# Patient Record
Sex: Female | Born: 1995 | Race: White | Hispanic: No | Marital: Single | Smoking: Never smoker
Health system: Southern US, Community
[De-identification: ages and names within clinical notes are randomized; demographics above are authoritative.]

## PROBLEM LIST (undated history)

## (undated) DIAGNOSIS — F32A Depression, unspecified: Secondary | ICD-10-CM

## (undated) DIAGNOSIS — E669 Obesity, unspecified: Secondary | ICD-10-CM

## (undated) DIAGNOSIS — F419 Anxiety disorder, unspecified: Secondary | ICD-10-CM

## (undated) DIAGNOSIS — T7840XA Allergy, unspecified, initial encounter: Secondary | ICD-10-CM

## (undated) DIAGNOSIS — R600 Localized edema: Secondary | ICD-10-CM

## (undated) DIAGNOSIS — O139 Gestational [pregnancy-induced] hypertension without significant proteinuria, unspecified trimester: Secondary | ICD-10-CM

## (undated) HISTORY — DX: Allergy, unspecified, initial encounter: T78.40XA

## (undated) HISTORY — DX: Obesity, unspecified: E66.9

## (undated) HISTORY — DX: Gestational (pregnancy-induced) hypertension without significant proteinuria, unspecified trimester: O13.9

## (undated) HISTORY — DX: Localized edema: R60.0

## (undated) HISTORY — DX: Anxiety disorder, unspecified: F41.9

## (undated) HISTORY — DX: Depression, unspecified: F32.A

## (undated) HISTORY — PX: NO PAST SURGERIES: SHX2092

---
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
## 2014-10-11 DIAGNOSIS — J309 Allergic rhinitis, unspecified: Secondary | ICD-10-CM | POA: Insufficient documentation

## 2014-10-11 DIAGNOSIS — J3089 Other allergic rhinitis: Secondary | ICD-10-CM | POA: Insufficient documentation

## 2014-10-12 ENCOUNTER — Ambulatory Visit: Payer: Self-pay | Admitting: Unknown Physician Specialty

## 2014-12-26 ENCOUNTER — Encounter: Payer: Self-pay | Admitting: Unknown Physician Specialty

## 2014-12-26 ENCOUNTER — Ambulatory Visit (INDEPENDENT_AMBULATORY_CARE_PROVIDER_SITE_OTHER): Payer: Commercial Managed Care - PPO | Admitting: Unknown Physician Specialty

## 2014-12-26 VITALS — BP 125/76 | HR 83 | Temp 98.7°F | Ht 72.4 in | Wt 291.4 lb

## 2014-12-26 DIAGNOSIS — J0100 Acute maxillary sinusitis, unspecified: Secondary | ICD-10-CM

## 2014-12-26 DIAGNOSIS — H66002 Acute suppurative otitis media without spontaneous rupture of ear drum, left ear: Secondary | ICD-10-CM | POA: Diagnosis not present

## 2014-12-26 MED ORDER — AMOXICILLIN 875 MG PO TABS: 875.0000 mg | ORAL_TABLET | Freq: Two times a day (BID) | ORAL | Status: DC

## 2014-12-26 NOTE — Progress Notes (Signed)
BP 125/76 mmHg  Pulse 83  Temp(Src) 98.7 F (37.1 C)  Ht 6' 0.4" (1.839 m)  Wt 291 lb 6.4 oz (132.178 kg)  BMI 39.08 kg/m2  SpO2 97%  LMP 12/16/2014 (Approximate)   Subjective:    Patient ID: Claire Walker, female    DOB: January 26, 1995, 19 y.o.   MRN: [id number]  HPI: Claire Walker is a 19 y.o. female  Chief Complaint  Patient presents with  . URI    pt states she has been having sinus draingage, nasal congestion, headache, ear ache (ringing in left ear), and sore throat. Patient states she cannot hear well, her face is puffy, and that these symptoms first started about a week ago.   URI  This is a new problem. The current episode started 1 to 4 weeks ago. The problem has been unchanged. There has been no fever. Associated symptoms include congestion, coughing, a plugged ear sensation, sinus pain and a sore throat. She has tried acetaminophen, NSAIDs and decongestant for the symptoms. The treatment provided no relief.    Relevant past medical, surgical, family and social history reviewed and updated as indicated. Interim medical history since our last visit reviewed. Allergies and medications reviewed and updated.  Review of Systems  HENT: Positive for congestion and sore throat.   Respiratory: Positive for cough.     Per HPI unless specifically indicated above     Objective:    BP 125/76 mmHg  Pulse 83  Temp(Src) 98.7 F (37.1 C)  Ht 6' 0.4" (1.839 m)  Wt 291 lb 6.4 oz (132.178 kg)  BMI 39.08 kg/m2  SpO2 97%  LMP 12/16/2014 (Approximate)  Wt Readings from Last 3 Encounters:  12/26/14 291 lb 6.4 oz (132.178 kg)  10/11/14 253 lb (114.76 kg)    Physical Exam  Constitutional: She is oriented to person, place, and time. She appears well-developed and well-nourished. No distress.  HENT:  Head: Normocephalic and atraumatic.  Right Ear: Ear canal normal. Tympanic membrane is erythematous and bulging. A middle ear effusion is present. Decreased hearing is noted.  Left Ear:  Tympanic membrane and ear canal normal.  Nose: No rhinorrhea. Right sinus exhibits maxillary sinus tenderness. Right sinus exhibits no frontal sinus tenderness. Left sinus exhibits maxillary sinus tenderness. Left sinus exhibits no frontal sinus tenderness.  Eyes: Conjunctivae and lids are normal. Right eye exhibits no discharge. Left eye exhibits no discharge. No scleral icterus.  Cardiovascular: Normal rate and regular rhythm.   Pulmonary/Chest: Effort normal and breath sounds normal. No respiratory distress.  Abdominal: Normal appearance. There is no splenomegaly or hepatomegaly.  Musculoskeletal: Normal range of motion.  Neurological: She is alert and oriented to person, place, and time.  Skin: Skin is intact. No rash noted. No pallor.  Psychiatric: She has a normal mood and affect. Her behavior is normal. Judgment and thought content normal.    No results found for this or any previous visit.    Assessment & Plan:   Problem List Items Addressed This Visit    None    Visit Diagnoses    Acute maxillary sinusitis, recurrence not specified    -  Primary    Relevant Medications    amoxicillin (AMOXIL) 875 MG tablet    Acute suppurative otitis media of left ear without spontaneous rupture of tympanic membrane, recurrence not specified        Rx for Amoxil 875 mg BID    Relevant Medications    amoxicillin (AMOXIL) 875 MG tablet  Follow up plan: Return if symptoms worsen or fail to improve.

## 2015-01-07 ENCOUNTER — Telehealth: Payer: Self-pay | Admitting: Unknown Physician Specialty

## 2015-01-07 MED ORDER — FLUCONAZOLE 150 MG PO TABS: 150.0000 mg | ORAL_TABLET | Freq: Once | ORAL | Status: DC

## 2015-01-07 NOTE — Telephone Encounter (Signed)
Routing to provider. Patient was seen 12/26/14.

## 2015-01-07 NOTE — Telephone Encounter (Signed)
Pt would like to have something called in to [facility] for a yeast infection.

## 2015-01-08 ENCOUNTER — Telehealth: Payer: Self-pay | Admitting: Unknown Physician Specialty

## 2015-01-08 MED ORDER — AZITHROMYCIN 250 MG PO TABS: ORAL_TABLET | ORAL | Status: DC

## 2015-01-08 NOTE — Telephone Encounter (Signed)
Called and left patient a voicemail letting her know that another rx was sent to her pharmacy.

## 2015-01-08 NOTE — Telephone Encounter (Signed)
Routing to provider. Patient was seen 12/26/14. 

## 2015-01-08 NOTE — Telephone Encounter (Signed)
pts mom called and stated that her daughter wasn't getting any better and would like something else to be sent to [facility]

## 2015-02-07 ENCOUNTER — Ambulatory Visit (INDEPENDENT_AMBULATORY_CARE_PROVIDER_SITE_OTHER): Payer: Commercial Managed Care - PPO | Admitting: Family Medicine

## 2015-02-07 ENCOUNTER — Encounter: Payer: Self-pay | Admitting: Family Medicine

## 2015-02-07 VITALS — BP 130/80 | HR 83 | Temp 98.3°F | Ht 71.2 in | Wt 292.0 lb

## 2015-02-07 DIAGNOSIS — Z30011 Encounter for initial prescription of contraceptive pills: Secondary | ICD-10-CM

## 2015-02-07 MED ORDER — NORETHIN ACE-ETH ESTRAD-FE 1-20 MG-MCG PO TABS
1.0000 | ORAL_TABLET | Freq: Every day | ORAL | Status: DC
Start: 2015-02-07 — End: 2016-02-12

## 2015-02-07 NOTE — Patient Instructions (Signed)
Oral Contraception Use Oral contraceptive pills (OCPs) are medicines taken to prevent pregnancy. OCPs work by preventing the ovaries from releasing eggs. The hormones in OCPs also cause the cervical mucus to thicken, preventing the sperm from entering the uterus. The hormones also cause the uterine lining to become thin, not allowing a fertilized egg to attach to the inside of the uterus. OCPs are highly effective when taken exactly as prescribed. However, OCPs do not prevent sexually transmitted diseases (STDs). Safe sex practices, such as using condoms along with an OCP, can help prevent STDs. Before taking OCPs, you may have a physical exam and Pap test. Your health care provider may also order blood tests if necessary. Your health care provider will make sure you are a good candidate for oral contraception. Discuss with your health care provider the possible side effects of the OCP you may be prescribed. When starting an OCP, it can take 2 to 3 months for the body to adjust to the changes in hormone levels in your body.  HOW TO TAKE ORAL CONTRACEPTIVE PILLS Your health care provider may advise you on how to start taking the first cycle of OCPs. Otherwise, you can:   Start on day 1 of your menstrual period. You will not need any backup contraceptive protection with this start time.   Start on the first Sunday after your menstrual period or the day you get your prescription. In these cases, you will need to use backup contraceptive protection for the first week.   Start the pill at any time of your cycle. If you take the pill within 5 days of the start of your period, you are protected against pregnancy right away. In this case, you will not need a backup form of birth control. If you start at any other time of your menstrual cycle, you will need to use another form of birth control for 7 days. If your OCP is the type called a minipill, it will protect you from pregnancy after taking it for 2 days (48  hours). After you have started taking OCPs:   If you forget to take 1 pill, take it as soon as you remember. Take the next pill at the regular time.   If you miss 2 or more pills, call your health care provider because different pills have different instructions for missed doses. Use backup birth control until your next menstrual period starts.   If you use a 28-day pack that contains inactive pills and you miss 1 of the last 7 pills (pills with no hormones), it will not matter. Throw away the rest of the non-hormone pills and start a new pill pack.  No matter which day you start the OCP, you will always start a new pack on that same day of the week. Have an extra pack of OCPs and a backup contraceptive method available in case you miss some pills or lose your OCP pack.  HOME CARE INSTRUCTIONS   Do not smoke.   Always use a condom to protect against STDs. OCPs do not protect against STDs.   Use a calendar to mark your menstrual period days.   Read the information and directions that came with your OCP. Talk to your health care provider if you have questions.  SEEK MEDICAL CARE IF:   You develop nausea and vomiting.   You have abnormal vaginal discharge or bleeding.   You develop a rash.   You miss your menstrual period.   You are losing   your hair.   You need treatment for mood swings or depression.   You get dizzy when taking the OCP.   You develop acne from taking the OCP.   You become pregnant.  SEEK IMMEDIATE MEDICAL CARE IF:   You develop chest pain.   You develop shortness of breath.   You have an uncontrolled or severe headache.   You develop numbness or slurred speech.   You develop visual problems.   You develop pain, redness, and swelling in the legs.    This information is not intended to replace advice given to you by your health care provider. Make sure you discuss any questions you have with your health care provider.   Document  Released: 12/25/2010 Document Revised: 01/26/2014 Document Reviewed: 06/26/2012 Elsevier Interactive Patient Education 2016 [employer]  

## 2015-02-07 NOTE — Progress Notes (Signed)
BP 130/80 mmHg  Pulse 83  Temp(Src) 98.3 F (36.8 C)  Ht 5' 11.2" (1.808 m)  Wt 292 lb (132.45 kg)  BMI 40.52 kg/m2  SpO2 98%  LMP 02/04/2015 (Exact Date)   Subjective:    Patient ID: Claire Walker, female    DOB: October 19, 1995, 20 y.o.   MRN: [id number]  HPI: Claire Walker is a 20 y.o. female  Chief Complaint  Patient presents with  . Contraception    Patient would like to discuss starting a birth control pill to help her periods, they are very heavy. She states that she would like one that is not going to cause her to gain weight   CONTRACEPTION CONCERNS Contraception: Occasional condom Previous contraception: OCP, doesn't remember what it was Sexual activity: Sexually active, no concerns,  Gravida/Para: G0P0 Menarche at age: 61 Average interval between menses: about a month  Length of menses: 5-7 days Flow: very heavy- has been going on for a long time since about 2 years after started period, but just dealt with it. Usually has to change every 2-3 hours Dysmenorrhea: bad cramps  Relevant past medical, surgical, family and social history reviewed and updated as indicated. Interim medical history since our last visit reviewed. Allergies and medications reviewed and updated.  Review of Systems  Constitutional: Negative.   Cardiovascular: Negative.   Gastrointestinal: Negative.   Genitourinary: Negative.   Psychiatric/Behavioral: Negative.    Per HPI unless specifically indicated above     Objective:    BP 130/80 mmHg  Pulse 83  Temp(Src) 98.3 F (36.8 C)  Ht 5' 11.2" (1.808 m)  Wt 292 lb (132.45 kg)  BMI 40.52 kg/m2  SpO2 98%  LMP 02/04/2015 (Exact Date)  Wt Readings from Last 3 Encounters:  02/07/15 292 lb (132.45 kg)  12/26/14 291 lb 6.4 oz (132.178 kg)  10/11/14 253 lb (114.76 kg)    Physical Exam  Constitutional: She is oriented to person, place, and time. She appears well-developed and well-nourished. No distress.  HENT:  Head: Normocephalic and  atraumatic.  Right Ear: Hearing normal.  Left Ear: Hearing normal.  Nose: Nose normal.  Eyes: Conjunctivae and lids are normal. Right eye exhibits no discharge. Left eye exhibits no discharge. No scleral icterus.  Cardiovascular: Normal rate, regular rhythm, normal heart sounds and intact distal pulses.  Exam reveals no gallop and no friction rub.   No murmur heard. Pulmonary/Chest: Effort normal and breath sounds normal. No respiratory distress. She has no wheezes. She has no rales. She exhibits no tenderness.  Musculoskeletal: Normal range of motion.  Neurological: She is alert and oriented to person, place, and time.  Skin: Skin is warm, dry and intact. No rash noted. No erythema. No pallor.  Psychiatric: She has a normal mood and affect. Her speech is normal and behavior is normal. Judgment and thought content normal. Cognition and memory are normal.  Nursing note and vitals reviewed.  No results found for this or any previous visit.    Assessment & Plan:   Problem List Items Addressed This Visit    None    Visit Diagnoses    Encounter for initial prescription of contraceptive pills    -  Primary    Risks and benefits discussed. We will start OCP. She will return in 1 month to make sure BP is doing OK and to check to see how she is doing.         Follow up plan: Return in about 4 weeks (around 03/07/2015) for  follow up OCP.

## 2015-02-08 ENCOUNTER — Ambulatory Visit: Payer: Commercial Managed Care - PPO | Admitting: Unknown Physician Specialty

## 2015-03-08 ENCOUNTER — Encounter: Payer: Self-pay | Admitting: Unknown Physician Specialty

## 2015-03-08 ENCOUNTER — Ambulatory Visit: Payer: Commercial Managed Care - PPO | Admitting: Unknown Physician Specialty

## 2015-03-08 VITALS — BP 125/81 | HR 69 | Temp 98.3°F | Ht 71.3 in | Wt 281.0 lb

## 2015-03-08 DIAGNOSIS — Z3009 Encounter for other general counseling and advice on contraception: Secondary | ICD-10-CM

## 2015-03-08 NOTE — Progress Notes (Signed)
   BP 125/81 mmHg  Pulse 69  Temp(Src) 98.3 F (36.8 C)  Ht 5' 11.3" (1.811 m)  Wt 281 lb (127.461 kg)  BMI 38.86 kg/m2  SpO2 98%  LMP 02/04/2015 (Exact Date)   Subjective:    Patient ID: Claire Walker, female    DOB: [birth year], 20 y.o.   MRN: [id number]  HPI: Claire Walker is a 20 y.o. female  Chief Complaint  Patient presents with  . Contraception    pt states she is here for 1 month f/u after starting BC   Pt is here for a BP check to f/u her BCPs Relevant past medical, surgical, family and social history reviewed and updated as indicated. Interim medical history since our last visit reviewed. Allergies and medications reviewed and updated.  Review of Systems  Per HPI unless specifically indicated above     Objective:    BP 125/81 mmHg  Pulse 69  Temp(Src) 98.3 F (36.8 C)  Ht 5' 11.3" (1.811 m)  Wt 281 lb (127.461 kg)  BMI 38.86 kg/m2  SpO2 98%  LMP 02/04/2015 (Exact Date)  Wt Readings from Last 3 Encounters:  03/08/15 281 lb (127.461 kg)  02/07/15 292 lb (132.45 kg)  12/26/14 291 lb 6.4 oz (132.178 kg)    Physical Exam  Constitutional: She is oriented to person, place, and time. She appears well-developed and well-nourished. No distress.  HENT:  Head: Normocephalic and atraumatic.  Eyes: Conjunctivae and lids are normal. Right eye exhibits no discharge. Left eye exhibits no discharge. No scleral icterus.  Cardiovascular: Normal rate.   Pulmonary/Chest: Effort normal.  Abdominal: Normal appearance. There is no splenomegaly or hepatomegaly.  Musculoskeletal: Normal range of motion.  Neurological: She is alert and oriented to person, place, and time.  Skin: Skin is intact. No rash noted. No pallor.  Psychiatric: She has a normal mood and affect. Her behavior is normal. Judgment and thought content normal.    No results found for this or any previous visit.    Assessment & Plan:   Problem List Items Addressed This Visit    None    Visit Diagnoses    Family planning    -  Primary    Normal BP.  Continue present BCPs        Follow up plan: Return for physical.

## 2015-03-21 ENCOUNTER — Telehealth: Payer: Self-pay | Admitting: Unknown Physician Specialty

## 2015-03-21 NOTE — Telephone Encounter (Signed)
Pt would like to speak with Elnita Maxwell about side effects from her new birth control. Generally works until 4pm if she could get a call back after work.

## 2015-03-22 NOTE — Telephone Encounter (Signed)
Routing to [name]

## 2015-03-22 NOTE — Telephone Encounter (Signed)
Please let her know that I won't be in this afternoon.  Also, let her know that many bcps cause irregular bleeding for about 3 months.  If she could let you know the side-effects, it would be helpful

## 2015-03-22 NOTE — Telephone Encounter (Signed)
Called and apologized to patient as Claire Walker and I were both out of the office yesterday afternoon and will be this afternoon as well. I gave her the number to my direct line and asked her to please leave me a voicemail letting us know what kind of side effects she is having.

## 2015-03-25 NOTE — Telephone Encounter (Signed)
Called and left patient a voicemail letting her know what Elnita MaxwellCheryl said. I asked for her to please give us a call back if she has any other questions or concerns.

## 2015-03-25 NOTE — Telephone Encounter (Signed)
Patient returned call. She stated that her period came early because she states she is just now getting ready to start her 4th week of pills. She also states that when her period started last week that it is still going on but the whole time it has been a brown color and very very light bleeding. She just wants to make sure that is okay.

## 2015-03-25 NOTE — Telephone Encounter (Signed)
Called and let patient know that Elnita MaxwellCheryl and I would be in the office all day today until 5 pm. I left a voicemail asking for the patient to please return my call when she could.

## 2015-03-25 NOTE — Telephone Encounter (Signed)
It is OK.  If often takes about 3 months for your period to regulate

## 2015-06-05 ENCOUNTER — Encounter: Payer: Commercial Managed Care - PPO | Admitting: Unknown Physician Specialty

## 2015-09-26 ENCOUNTER — Ambulatory Visit
Admission: RE | Admit: 2015-09-26 | Discharge: 2015-09-26 | Disposition: A | Discharge status: Home / Self Care | Payer: Commercial Managed Care - PPO | Source: Ambulatory Visit | Attending: Family Medicine | Admitting: Family Medicine

## 2015-09-26 ENCOUNTER — Ambulatory Visit (INDEPENDENT_AMBULATORY_CARE_PROVIDER_SITE_OTHER): Payer: Commercial Managed Care - PPO | Admitting: Family Medicine

## 2015-09-26 ENCOUNTER — Encounter: Payer: Self-pay | Admitting: Family Medicine

## 2015-09-26 ENCOUNTER — Telehealth: Payer: Self-pay | Admitting: Family Medicine

## 2015-09-26 VITALS — BP 122/75 | HR 72 | Temp 98.6°F | Wt 282.0 lb

## 2015-09-26 DIAGNOSIS — S99911A Unspecified injury of right ankle, initial encounter: Secondary | ICD-10-CM

## 2015-09-26 DIAGNOSIS — M7989 Other specified soft tissue disorders: Secondary | ICD-10-CM | POA: Insufficient documentation

## 2015-09-26 DIAGNOSIS — M25571 Pain in right ankle and joints of right foot: Secondary | ICD-10-CM | POA: Diagnosis present

## 2015-09-26 NOTE — Telephone Encounter (Signed)
Patient notified

## 2015-09-26 NOTE — Patient Instructions (Signed)
Follow up as needed

## 2015-09-26 NOTE — Telephone Encounter (Signed)
Please call patient and let her know her x-ray was negative for fracture, just a bad sprain. Continue as planned.

## 2015-09-26 NOTE — Progress Notes (Signed)
   BP 122/75   Pulse 72   Temp 98.6 F (37 C)   Wt 282 lb (127.9 kg)   LMP 09/08/2015   SpO2 98%   BMI 39.00 kg/m    Subjective:    Patient ID: Claire Walker, female    DOB: 1995/02/17, 20 y.o.   MRN: [id number]  HPI: Claire Walker is a 20 y.o. female  Chief Complaint  Patient presents with  . Ankle Pain    x 1 week, right ankle, missed a step going down the stairs. Swelling and bruising still.   Patient presents with 1 week history of right ankle pain following missing a step down her stairs. Persistent swelling, bruising, and moderate to severe pain with weight bearing. States the muscles in her right calf are sore as well and feel like they're pulling when she bends her ankle. Works on her feet all day and is having a lot of trouble continuing to work. Taking motrin and tylenol with some relief. Has been using RICE methods.   Relevant past medical, surgical, family and social history reviewed and updated as indicated. Interim medical history since our last visit reviewed. Allergies and medications reviewed and updated.  Review of Systems  Constitutional: Negative.   HENT: Negative.   Respiratory: Negative.   Cardiovascular: Negative.   Gastrointestinal: Negative.   Genitourinary: Negative.   Musculoskeletal: Positive for arthralgias, gait problem, joint swelling and myalgias.  Skin: Negative.   Psychiatric/Behavioral: Negative.     Per HPI unless specifically indicated above     Objective:    BP 122/75   Pulse 72   Temp 98.6 F (37 C)   Wt 282 lb (127.9 kg)   LMP 09/08/2015   SpO2 98%   BMI 39.00 kg/m   Wt Readings from Last 3 Encounters:  09/26/15 282 lb (127.9 kg)  03/08/15 281 lb (127.5 kg)  02/07/15 292 lb (132.5 kg)    Physical Exam  Constitutional: She is oriented to person, place, and time. She appears well-developed and well-nourished.  HENT:  Head: Atraumatic.  Eyes: Conjunctivae are normal. No scleral icterus.  Neck: Normal range of motion. Neck  supple.  Cardiovascular: Normal rate and normal heart sounds.   Pulmonary/Chest: Effort normal. No respiratory distress.  Musculoskeletal: She exhibits edema and tenderness (TTP over right lateral malleolus).  Passive ROM mostly intact in R ankle, limited by swelling and pain  Neurological: She is alert and oriented to person, place, and time.  Skin: Skin is warm and dry.  Some bruising over right lateral malleolus   Psychiatric: She has a normal mood and affect. Her behavior is normal.    No results found for this or any previous visit.    Assessment & Plan:   Problem List Items Addressed This Visit    None    Visit Diagnoses    Ankle injury, right, initial encounter    -  Primary   Low suspicion for fracture, likely just a bad sprain, but will get ankle imaging to r/o. Continue motrin and tylenol, RICE.    Relevant Orders   DG Ankle Complete Right       Follow up plan: Return if symptoms worsen or fail to improve.

## 2015-09-26 NOTE — Telephone Encounter (Signed)
Left message to call.

## 2015-09-27 ENCOUNTER — Ambulatory Visit: Payer: Commercial Managed Care - PPO | Admitting: Family Medicine

## 2015-10-04 ENCOUNTER — Encounter: Payer: Self-pay | Admitting: Unknown Physician Specialty

## 2015-10-04 ENCOUNTER — Ambulatory Visit (INDEPENDENT_AMBULATORY_CARE_PROVIDER_SITE_OTHER): Payer: Commercial Managed Care - PPO | Admitting: Unknown Physician Specialty

## 2015-10-04 VITALS — BP 119/79 | HR 66 | Temp 98.4°F | Ht 73.1 in | Wt 283.0 lb

## 2015-10-04 DIAGNOSIS — L298 Other pruritus: Secondary | ICD-10-CM | POA: Diagnosis not present

## 2015-10-04 DIAGNOSIS — Z3041 Encounter for surveillance of contraceptive pills: Secondary | ICD-10-CM

## 2015-10-04 DIAGNOSIS — N898 Other specified noninflammatory disorders of vagina: Secondary | ICD-10-CM

## 2015-10-04 LAB — WET PREP FOR TRICH, YEAST, CLUE
Clue Cell Exam: NEGATIVE
Trichomonas Exam: NEGATIVE
Yeast Exam: NEGATIVE

## 2015-10-04 MED ORDER — FLUCONAZOLE 150 MG PO TABS
150.0000 mg | ORAL_TABLET | Freq: Once | ORAL | 0 refills | Status: AC
Start: 2015-10-04 — End: 2015-10-04

## 2015-10-04 MED ORDER — DROSPIRENONE-ETHINYL ESTRADIOL 3-0.03 MG PO TABS: 1.0000 | ORAL_TABLET | Freq: Every day | ORAL | 3 refills | Status: DC

## 2015-10-04 NOTE — Progress Notes (Signed)
   BP 119/79 (BP Location: Left Arm, Patient Position: Sitting, Cuff Size: Large)   Pulse 66   Temp 98.4 F (36.9 C)   Ht 6' 1.1" (1.857 m)   Wt 283 lb (128.4 kg)   LMP 09/08/2015 (Approximate)   SpO2 98%   BMI 37.24 kg/m    Subjective:    Patient ID: Claire Walker, female    DOB: 05/30/1995, 20 y.o.   MRN: [id number]  HPI: Claire Walker is a 20 y.o. female  Chief Complaint  Patient presents with  . Contraception    pt states she wants to talk with provider about changing BC  . Vaginal Itching    pt states she has been having some vaginal itching for about a week now    BCPs Pt is wondering if a new BCP would help her feelings of depression.  She states it is not severe but wants to try something different besides the pill.    Vaginal itching States it has been there for over a week.  States she is having some discharge but not "a lot."  She has not noticed a monthly issue.    Relevant past medical, surgical, family and social history reviewed and updated as indicated. Interim medical history since our last visit reviewed. Allergies and medications reviewed and updated.  Review of Systems  Per HPI unless specifically indicated above     Objective:    BP 119/79 (BP Location: Left Arm, Patient Position: Sitting, Cuff Size: Large)   Pulse 66   Temp 98.4 F (36.9 C)   Ht 6' 1.1" (1.857 m)   Wt 283 lb (128.4 kg)   LMP 09/08/2015 (Approximate)   SpO2 98%   BMI 37.24 kg/m   Wt Readings from Last 3 Encounters:  10/04/15 283 lb (128.4 kg)  09/26/15 282 lb (127.9 kg)  03/08/15 281 lb (127.5 kg)    Physical Exam  Genitourinary: Uterus normal. No labial fusion. There is no rash, tenderness, lesion or injury on the right labia. There is no rash, tenderness, lesion or injury on the left labia. Cervix exhibits no motion tenderness, no discharge and no friability. Right adnexum displays no mass. Left adnexum displays no mass. Vaginal discharge found.    No results found for  this or any previous visit.    Assessment & Plan:   Problem List Items Addressed This Visit    None    Visit Diagnoses    Vaginal itching    -  Primary   Rx for Diflucan.  wet prep negative   Relevant Orders   WET PREP FOR TRICH, YEAST, CLUE   Encounter for surveillance of contraceptive pills       Change to Yasmin.  May need to switch to continuous use   Relevant Orders   Pap Lb, rfx HPV ASCU       Follow up plan: Return for physical.

## 2015-10-07 ENCOUNTER — Encounter: Payer: Self-pay | Admitting: Unknown Physician Specialty

## 2015-10-07 LAB — PAP LB, RFX HPV ASCU: PAP Smear Comment: 0

## 2015-10-07 NOTE — Progress Notes (Signed)
Normal labs.  Patient notified by letter.

## 2016-02-12 ENCOUNTER — Other Ambulatory Visit: Payer: Self-pay | Admitting: Family Medicine

## 2016-02-12 NOTE — Telephone Encounter (Signed)
Your patient 

## 2016-10-08 ENCOUNTER — Telehealth: Payer: Self-pay | Admitting: Unknown Physician Specialty

## 2016-10-08 NOTE — Telephone Encounter (Signed)
Patient would like to know if provider or CMA recommeds nutritionist. Patient would like to know if there are any good nutritionist that are usually recommended to patients.  Please Advise.  Thank you

## 2016-10-08 NOTE — Telephone Encounter (Signed)
Routing to provider for advice.

## 2016-10-09 NOTE — Telephone Encounter (Signed)
I would look at her insurance as to what services is covered and who is in network.  [employer] is a great program is goal is to lose weight

## 2016-10-09 NOTE — Telephone Encounter (Signed)
Called and left patient a VM asking for her to please return my call.  

## 2016-10-12 NOTE — Telephone Encounter (Signed)
Called and let patient know what [name] said.  

## 2017-01-06 ENCOUNTER — Telehealth: Payer: Self-pay | Admitting: Unknown Physician Specialty

## 2017-01-06 NOTE — Telephone Encounter (Signed)
Routing to provider, FYI.  

## 2017-01-06 NOTE — Telephone Encounter (Signed)
Patient's mother is calling to report her daughter has been sick with vomiting and diarrhea for 3-4 days. She took Pepto and she did improve some- but she is avoiding eating. Mother reports no fever or  headache. Patient is currently at work and unable to speak. Encouraged mother to have patient drink to hydrate and replace her electrolytes. Also advise mother to have patient call for an appointment if she does not improve within the next 24-48 hours.  (Unable to triage patient- mother calling) Mother called back- daughter does have neck pain- offered appointment- she is not going to miss work- mother is going to take her to Urgent Care

## 2017-01-06 NOTE — Telephone Encounter (Signed)
Thanks, but it looks like she is talking about her daughter.  Is she a pt here?

## 2017-01-07 ENCOUNTER — Ambulatory Visit: Payer: Commercial Managed Care - PPO | Admitting: Family Medicine

## 2017-02-11 ENCOUNTER — Other Ambulatory Visit: Payer: Self-pay | Admitting: Unknown Physician Specialty

## 2017-04-07 ENCOUNTER — Encounter: Payer: Self-pay | Admitting: Unknown Physician Specialty

## 2017-04-07 ENCOUNTER — Ambulatory Visit (INDEPENDENT_AMBULATORY_CARE_PROVIDER_SITE_OTHER): Payer: Self-pay | Admitting: Unknown Physician Specialty

## 2017-04-07 DIAGNOSIS — F322 Major depressive disorder, single episode, severe without psychotic features: Secondary | ICD-10-CM

## 2017-04-07 MED ORDER — CITALOPRAM HYDROBROMIDE 20 MG PO TABS
20.0000 mg | ORAL_TABLET | Freq: Every day | ORAL | 3 refills | Status: DC
Start: 2017-04-07 — End: 2017-06-07

## 2017-04-07 NOTE — Progress Notes (Signed)
BP 121/77   Pulse 67   Temp 98.5 F (36.9 C) (Oral)   Ht 5' 11.7" (1.821 m)   Wt 283 lb 9.6 oz (128.6 kg)   LMP 03/30/2017 (Approximate)   SpO2 99%   BMI 38.79 kg/m    Subjective:    Patient ID: Claire Walker, female    DOB: [birth year], 22 y.o.   MRN: [id number]  HPI: Claire Walker is a 22 y.o. female  Chief Complaint  Patient presents with  . Anxiety    pt states she has been bothered by her anxiety for a while now   Pt having a lot of anxiety for a long time.  States this has been going on for quite some time and thinks she has been depressed her whole life.  States she is tired.  States her mom has had mental breakdowns her whole life.  She feels she is now an age in which she can get help.  No suicidal ideation.  Working 2 jobs and going to school full time.  She is unable to stop her brain.  She is not happy with the way she is and frustrated with obesity.  MDQ with 5 yes answers     [facility]2017  Decreased Interest 2  Down, Depressed, Hopeless 2  PHQ - 2 Score 4  Altered sleeping 2  Tired, decreased energy 2  Change in appetite 2  Feeling bad or failure about yourself  2  Trouble concentrating 3  Moving slowly or fidgety/restless 2  Suicidal thoughts 0  PHQ-9 Score 17   Obesity Pt is very concerned about this.  She is very athletic and has a healthy diet.  Overweight all her live   Relevant past medical, surgical, family and social history reviewed and updated as indicated. Interim medical history since our last visit reviewed. Allergies and medications reviewed and updated.  Review of Systems  Constitutional: Negative.   Respiratory: Negative.   Cardiovascular: Negative.   Gastrointestinal: Negative.   Genitourinary: Negative.   Musculoskeletal: Negative.   Neurological: Negative.     Per HPI unless specifically indicated above     Objective:    BP 121/77   Pulse 67   Temp 98.5 F (36.9 C) (Oral)   Ht 5' 11.7" (1.821 m)   Wt  283 lb 9.6 oz (128.6 kg)   LMP 03/30/2017 (Approximate)   SpO2 99%   BMI 38.79 kg/m   Wt Readings from Last 3 Encounters:  04/07/17 283 lb 9.6 oz (128.6 kg)  10/04/15 283 lb (128.4 kg)  09/26/15 282 lb (127.9 kg)    Physical Exam  Constitutional: She is oriented to person, place, and time. She appears well-developed and well-nourished. No distress.  HENT:  Head: Normocephalic and atraumatic.  Eyes: Conjunctivae and lids are normal. Right eye exhibits no discharge. Left eye exhibits no discharge. No scleral icterus.  Neck: Normal range of motion. Neck supple. No JVD present. Carotid bruit is not present.  Cardiovascular: Normal rate, regular rhythm and normal heart sounds.  Pulmonary/Chest: Effort normal and breath sounds normal.  Abdominal: Normal appearance. There is no splenomegaly or hepatomegaly.  Musculoskeletal: Normal range of motion.  Neurological: She is alert and oriented to person, place, and time.  Skin: Skin is warm, dry and intact. No rash noted. No pallor.  Psychiatric: She has a normal mood and affect. Her behavior is normal. Judgment and thought content normal.      Assessment & Plan:   Problem  List Items Addressed This Visit      Unprioritized   Depression, major, single episode, severe (HCC)    Pt with significant new problem of depression.  Can't r/o bipolar. Will start with Citalopram with warnings about mania. Discussed side-effects.  Stop medication immediately if signs of mania or suicidal ideation.  Recheck in 2 weeks      Relevant Medications   citalopram (CELEXA) 20 MG tablet       Follow up plan: Return in about 2 weeks (around 04/21/2017).

## 2017-04-07 NOTE — Assessment & Plan Note (Addendum)
Pt with significant new problem of depression.  Can't r/o bipolar. Will start with Citalopram with warnings about mania. Discussed side-effects.  Stop medication immediately if signs of mania or suicidal ideation.  Recheck in 2 weeks.  Check out employee assistance counseling

## 2017-04-27 ENCOUNTER — Encounter: Payer: Self-pay | Admitting: Unknown Physician Specialty

## 2017-04-27 ENCOUNTER — Ambulatory Visit (INDEPENDENT_AMBULATORY_CARE_PROVIDER_SITE_OTHER): Payer: Commercial Managed Care - PPO | Admitting: Unknown Physician Specialty

## 2017-04-27 DIAGNOSIS — F322 Major depressive disorder, single episode, severe without psychotic features: Secondary | ICD-10-CM

## 2017-04-27 DIAGNOSIS — E669 Obesity, unspecified: Secondary | ICD-10-CM | POA: Diagnosis not present

## 2017-04-27 NOTE — Assessment & Plan Note (Signed)
Much improvement on Citalopram 20 mg daily.

## 2017-04-27 NOTE — Patient Instructions (Addendum)
Belviq Contrave Saxenda  Wellbutrin - (another medication for depression.  Inexpensive)  Goodrx

## 2017-04-27 NOTE — Assessment & Plan Note (Signed)
She is very frustrated with her weight despite eating well and exercising.  I feel she is healthy.  Discussed medications weight loss options.

## 2017-04-27 NOTE — Progress Notes (Signed)
BP 127/80   Pulse 68   Temp 98.7 F (37.1 C) (Oral)   Ht 5' 11.7" (1.821 m)   Wt 283 lb 6.4 oz (128.5 kg)   LMP 03/30/2017 (Approximate)   SpO2 98%   BMI 38.76 kg/m    Subjective:    Patient ID: Claire Walker, female    DOB: [birth year], 22 y.o.   MRN: [id number]  HPI: Claire Walker is a 22 y.o. female  Chief Complaint  Patient presents with  . Depression    2 week f/up    Depression Pt is here to f/u Citlatopram given for depression and anxiety.  States she feels a lot better and feels "like a new person."  Things that used to bother her don't.  Sleeping at night OK.   [facility] 04/27/2017 04/07/2017  Decreased Interest 0 2  Down, Depressed, Hopeless 0 2  PHQ - 2 Score 0 4  Altered sleeping 0 2  Tired, decreased energy 0 2  Change in appetite 0 2  Feeling bad or failure about yourself  0 2  Trouble concentrating 0 3  Moving slowly or fidgety/restless 0 2  Suicidal thoughts 0 0  PHQ-9 Score 0 17     Relevant past medical, surgical, family and social history reviewed and updated as indicated. Interim medical history since our last visit reviewed. Allergies and medications reviewed and updated.  Review of Systems  Per HPI unless specifically indicated above     Objective:    BP 127/80   Pulse 68   Temp 98.7 F (37.1 C) (Oral)   Ht 5' 11.7" (1.821 m)   Wt 283 lb 6.4 oz (128.5 kg)   LMP 03/30/2017 (Approximate)   SpO2 98%   BMI 38.76 kg/m   Wt Readings from Last 3 Encounters:  04/27/17 283 lb 6.4 oz (128.5 kg)  04/07/17 283 lb 9.6 oz (128.6 kg)  10/04/15 283 lb (128.4 kg)    Physical Exam  Constitutional: She is oriented to person, place, and time. She appears well-developed and well-nourished. No distress.  HENT:  Head: Normocephalic and atraumatic.  Eyes: Conjunctivae and lids are normal. Right eye exhibits no discharge. Left eye exhibits no discharge. No scleral icterus.  Cardiovascular: Normal rate.  Pulmonary/Chest: Effort normal.    Abdominal: Normal appearance. There is no splenomegaly or hepatomegaly.  Musculoskeletal: Normal range of motion.  Neurological: She is alert and oriented to person, place, and time.  Skin: Skin is intact. No rash noted. No pallor.  Psychiatric: She has a normal mood and affect. Her behavior is normal. Judgment and thought content normal.    Results for orders placed or performed in visit on 10/04/15  WET PREP FOR TRICH, YEAST, CLUE  Result Value Ref Range   Trichomonas Exam Negative Negative   Yeast Exam Negative Negative   Clue Cell Exam Negative Negative  Pap Lb, rfx HPV ASCU  Result Value Ref Range   DIAGNOSIS: Comment    Specimen adequacy: Comment    Clinician Provided ICD10 Comment    Performed by: Comment    PAP Smear Comment .    Note: Comment    PAP Reflex Comment       Assessment & Plan:   Problem List Items Addressed This Visit      Unprioritized   Depression, major, single episode, severe (HCC)    Much improvement on Citalopram 20 mg daily.        Obesity (BMI 35.0-39.9 without comorbidity)  She is very frustrated with her weight despite eating well and exercising.  I feel she is healthy.  Discussed medications weight loss options.            Follow up plan: Return in about 1 month (around 05/25/2017).

## 2017-05-31 ENCOUNTER — Ambulatory Visit: Payer: Commercial Managed Care - PPO | Admitting: Unknown Physician Specialty

## 2017-06-07 ENCOUNTER — Telehealth: Payer: Self-pay | Admitting: Unknown Physician Specialty

## 2017-06-07 ENCOUNTER — Ambulatory Visit: Payer: Commercial Managed Care - PPO | Admitting: Unknown Physician Specialty

## 2017-06-07 MED ORDER — BUPROPION HCL ER (XL) 150 MG PO TB24: 150.0000 mg | ORAL_TABLET | Freq: Every day | ORAL | 1 refills | Status: DC

## 2017-06-07 NOTE — Telephone Encounter (Signed)
Tried calling patient. Patient did not answer and VM box was full so I could not leave a VM. OK for PEC to relay message to patient from [place] and schedule 6 week f/up. Will try to call patient again later if she does not call back.

## 2017-06-07 NOTE — Telephone Encounter (Signed)
Routing to provider  

## 2017-06-07 NOTE — Telephone Encounter (Signed)
Copied from CRM [id number]. Topic: General - Other >> Jun 07, 2017  8:14 AM Leafy Ro wrote: Reason for CRM:pt is calling the citalopram is making her hungry. Pt stated they discuss another medication that cost a little more. Pt does not remember the name of medication. Pt would like to try it. [facility] [place]. Pt would like a callback when new med is sent to pharm

## 2017-06-07 NOTE — Telephone Encounter (Signed)
Patient called and I told her who I was and that I was calling from [facility] office, I asked her to verify her DOB, patient says "I will have to call you back, bye." She hung the phone up.

## 2017-06-07 NOTE — Telephone Encounter (Signed)
I will change.  It is a different kind of medication and may work differently.  Please let her know that I would like to recheck her in about 6 weeks. Recommend gradual decrease of Citalopram to 1/2 pill for 1 week before discontinuing.  OK to take both meds on the same day and time.

## 2017-06-08 NOTE — Telephone Encounter (Signed)
Called and spoke to patient. I let her know about new medication and [name]'s instructions, patient verbalized understanding. Offered to schedule 6 week medication f/up for the patient but she stated that she would call back to schedule.

## 2017-06-30 ENCOUNTER — Telehealth: Payer: Self-pay

## 2017-06-30 NOTE — Telephone Encounter (Signed)
Copied from CRM [id number]. Topic: Inquiry >> Jun 28, 2017  8:26 AM Crist InfanteHarrald, Kathy J wrote: Reason for CRM: pt called to make a follow up, but [facility]  Pt asked if could send a message and let Elnita MaxwellCheryl know that she is feeling normal, able to work ok, all is good. Pt states the new med has also her hungry as the other, no change in appetite, Pt states you discussed her going on a weight loss med, contrave, belviq etc.  Anything that would help.  [id number] [place] [place] - [address] [phone] (Phone) [phone] (Fax)     >> Jun 30, 2017  9:56 AM Adela PortsWilliamson, Christan M wrote: Sorry about the delay, I just saw this message.    Routing to provider.

## 2017-06-30 NOTE — Telephone Encounter (Signed)
Please let her know that the wellbutrin she is on can be used as a weight loss medicine. She can check with her insurance about the other medicines that she and cheryl talked about- but it should not really be making her hungry.

## 2017-07-01 ENCOUNTER — Telehealth: Payer: Self-pay | Admitting: Unknown Physician Specialty

## 2017-07-01 NOTE — Telephone Encounter (Unsigned)
Copied from CRM [id number]. Topic: Inquiry >> Jun 28, 2017  8:26 AM Claire Walker, Claire Walker wrote: Reason for CRM: pt called to make a follow up, but [facility].  Pt asked if could send a message and let Claire MaxwellCheryl know that she is feeling normal, able to work ok, all is good. Pt states the new med has also her hungry as the other, no change in appetite, Pt states you discussed her going on a weight loss med, contrave, belviq etc.  Anything that would help.  [id number] [place] [place] - [address] [phone] (Phone) [phone] (Fax)     >> Jun 30, 2017  9:56 AM Adela PortsWilliamson, Claire Walker wrote: Sorry about the delay, I just saw this message.

## 2017-07-01 NOTE — Telephone Encounter (Signed)
Routing to provider  

## 2017-07-01 NOTE — Telephone Encounter (Signed)
Pt states she checked with her insurance, and they will not pay for any of the meds discussed.  Pt  is aware she will have to pay. Pt is requesting contrave, or another med. Pt states the welbutrin not helping. Pt states if she needs appt to discuss again, please let her know  [id number] [place], [place] - [address] [phone] (Phone) [phone] (Fax)

## 2017-07-01 NOTE — Telephone Encounter (Signed)
Patient will need to check with Elnita Maxwellheryl when she returns. In general weight loss medicines are not covered

## 2017-07-02 NOTE — Telephone Encounter (Signed)
Overdue for a follow up. Will need to come in for an appointment to discuss this and for follow up.

## 2017-07-02 NOTE — Telephone Encounter (Signed)
Patient states that she is aware that her insurance is not going to pay for these medications. Patient states she is willing to pay for it anyway, requesting contrave or another medication.

## 2017-07-05 NOTE — Telephone Encounter (Signed)
Please call and schedule patient a f/up visit to discuss medication per Dr. Laural BenesJohnson as Elnita MaxwellCheryl is out of the office. Thanks.

## 2017-07-07 NOTE — Telephone Encounter (Signed)
Patient called back and was scheduled an appointment via the PEC.

## 2017-07-07 NOTE — Telephone Encounter (Signed)
Tried to reach patient was unable to leave a message due to her VM full.  If patient calls back please schedule an appointment with a provider to discuss medication per Dr Laural BenesJohnson.   Thanks

## 2017-07-16 ENCOUNTER — Ambulatory Visit: Payer: Commercial Managed Care - PPO | Admitting: Physician Assistant

## 2017-07-31 ENCOUNTER — Other Ambulatory Visit: Payer: Self-pay | Admitting: Unknown Physician Specialty

## 2017-08-02 NOTE — Telephone Encounter (Signed)
Bupropion 150 MG tab refill Last Refill: 06/29/17 #30 tabs 0 refills. Last OV: 04/27/17 with remark patient to follow up in 1 month. She has been no show for last 3 appointments. TC to patient to schedule OV. Unable to leave VM at this time. Refilled for 30 days to bridge patient until an appointment can be made.   PCP: Jamesetta OrleansWicker, NP

## 2017-08-02 NOTE — Telephone Encounter (Signed)
Patient needs a follow-up from 04/27/17. She has cancelled last 3 appointments.

## 2017-08-24 ENCOUNTER — Other Ambulatory Visit: Payer: Self-pay | Admitting: Unknown Physician Specialty

## 2017-08-30 ENCOUNTER — Ambulatory Visit: Payer: Commercial Managed Care - PPO | Admitting: Family Medicine

## 2017-09-08 ENCOUNTER — Other Ambulatory Visit: Payer: Self-pay | Admitting: Unknown Physician Specialty

## 2017-09-10 ENCOUNTER — Ambulatory Visit: Payer: Commercial Managed Care - PPO | Admitting: Family Medicine

## 2017-10-07 ENCOUNTER — Other Ambulatory Visit: Payer: Self-pay | Admitting: Physician Assistant

## 2017-10-08 NOTE — Telephone Encounter (Signed)
Since you're here today.

## 2017-10-19 ENCOUNTER — Other Ambulatory Visit: Payer: Self-pay | Admitting: Unknown Physician Specialty

## 2018-01-19 DIAGNOSIS — E109 Type 1 diabetes mellitus without complications: Secondary | ICD-10-CM | POA: Diagnosis not present

## 2018-02-10 LAB — FETAL NONSTRESS TEST

## 2018-02-18 ENCOUNTER — Other Ambulatory Visit: Payer: Self-pay | Admitting: Unknown Physician Specialty

## 2018-02-19 DIAGNOSIS — E109 Type 1 diabetes mellitus without complications: Secondary | ICD-10-CM | POA: Diagnosis not present

## 2018-03-16 ENCOUNTER — Other Ambulatory Visit: Payer: Self-pay | Admitting: Unknown Physician Specialty

## 2018-03-16 MED ORDER — BUPROPION HCL ER (XL) 150 MG PO TB24: 150.0000 mg | ORAL_TABLET | Freq: Every day | ORAL | 0 refills | Status: DC

## 2018-03-16 NOTE — Telephone Encounter (Signed)
Please let patient know will refill x 30 days, but she needs follow-up for further refills as has not been seen since [place] 2019.  Thank you.

## 2018-03-16 NOTE — Telephone Encounter (Signed)
Copied from CRM [id number]. Topic: Quick Communication - Rx Refill/Question >> Mar 16, 2018  3:03 PM Wyonia Hough E wrote: Medication: buPROPion (WELLBUTRIN XL) 150 MG 24 hr tablet  Has the patient contacted their pharmacy? Yes - no refills    Preferred Pharmacy (with phone number or street name): [id number] [place], [place] - [address] [phone] (Phone) [phone] (Fax)    Agent: Please be advised that RX refills may take up to 3 business days. We ask that you follow-up with your pharmacy.

## 2018-03-16 NOTE — Telephone Encounter (Signed)
Requested medication (s) are due for refill today: yes  Requested medication (s) are on the active medication list: yes  Last refill:  10/08/17 #30 no RF  Future visit scheduled: No  Notes to clinic:  Called pt; unable to leave message voicemail not set up; >3 months overdue routing to office   Requested Prescriptions  Pending Prescriptions Disp Refills   buPROPion (WELLBUTRIN XL) 150 MG 24 hr tablet 30 tablet 0    Sig: Take 1 tablet (150 mg total) by mouth daily.     Psychiatry: Antidepressants - bupropion Failed - 03/16/2018  3:06 PM      Failed - Valid encounter within last 6 months    Recent Outpatient Visits          10 months ago Depression, major, single episode, severe (HCC)   [facility] Gabriel Cirri, NP   11 months ago Depression, major, single episode, severe (HCC)   [facility] Gabriel Cirri, NP   2 years ago Vaginal itching   [facility] Gabriel Cirri, NP   2 years ago Ankle injury, right, initial encounter   [facility] Particia N[place], [place]   3 years ago Family planning   [facility] Gabriel Cirri, NP             Passed - Completed PHQ-2 or PHQ-9 in the last 360 days.      Passed - Last BP in normal range    BP Readings from Last 1 Encounters:  04/27/17 127/80

## 2018-03-16 NOTE — Telephone Encounter (Signed)
Refill x 30 days, for further she needs follow-up.

## 2018-03-17 NOTE — Telephone Encounter (Signed)
Left message on machine for pt to return call to the office. Attempted second number listed and was told that was no longer her number. Will remove from contact list.

## 2018-03-17 NOTE — Telephone Encounter (Signed)
Tried to call patient, no answer, unable to leave a message. 

## 2018-03-18 NOTE — Telephone Encounter (Signed)
Attempted to reach pt. VM box has not been set up.

## 2018-03-21 NOTE — Telephone Encounter (Signed)
Due to inability to reach patient letter was printed and mailed.

## 2018-03-21 NOTE — Telephone Encounter (Signed)
Attempted to reach. VM box has not been set up.

## 2018-06-15 ENCOUNTER — Other Ambulatory Visit: Payer: Self-pay

## 2018-06-15 ENCOUNTER — Encounter: Payer: Self-pay | Admitting: Family Medicine

## 2018-06-15 ENCOUNTER — Ambulatory Visit (INDEPENDENT_AMBULATORY_CARE_PROVIDER_SITE_OTHER): Payer: Commercial Managed Care - PPO | Admitting: Family Medicine

## 2018-06-15 DIAGNOSIS — F322 Major depressive disorder, single episode, severe without psychotic features: Secondary | ICD-10-CM | POA: Diagnosis not present

## 2018-06-15 DIAGNOSIS — E669 Obesity, unspecified: Secondary | ICD-10-CM

## 2018-06-15 MED ORDER — BUPROPION HCL ER (XL) 300 MG PO TB24: 300.0000 mg | ORAL_TABLET | Freq: Every day | ORAL | 0 refills | Status: DC

## 2018-06-15 NOTE — Assessment & Plan Note (Signed)
Discussed some options, including nutrition referral or medications strictly for weight loss. Patient agreeable to increasing wellbutrin to try and get more craving control benefit as well as better anxiety/mood benefit. Will also work on improving diet and exercise further. Recheck in 1 month

## 2018-06-15 NOTE — Progress Notes (Signed)
There were no vitals taken for this visit.   Subjective:    Patient ID: Claire Walker, female    DOB: 04-22-1995, 23 y.o.   MRN: [id number]  HPI: Claire Walker is a 23 y.o. female  Chief Complaint  Patient presents with  . Anxiety    pt states she wants to discuss her medication  . Weight Gain    . This visit was completed via WebEx due to the restrictions of the COVID-19 pandemic. All issues as above were discussed and addressed. Physical exam was done as above through visual confirmation on WebEx. If it was felt that the patient should be evaluated in the office, they were directed there. The patient verbally consented to this visit. . Location of the patient: in car . Location of the provider: home . Those involved with this call:  . Provider: Roosvelt Maser, PA-C . CMA: Wilhemena Durie, CMA . Front Desk/Registration: Harriet Pho  . Time spent on call: 20 minutes with patient face to face via video conference. More than 50% of this time was spent in counseling and coordination of care. 5 minutes total spent in review of patient's record and preparation of their chart. I verified patient identity using two factors (patient name and date of birth). Patient consents verbally to being seen via telemedicine visit today.   Patient here today for mood and weight concerns. Has been gaining lots of weight lately, knows she isn't as active as she used to be but trying hard to eat well. Feels it may be related to her wellbutrin but unsure. Feels the medicine does help with her moods, but does still have some breakthrough anxiety/feelings of being overwhelmed. Denies SI/HI, side effects to the medicine, sleep issues.   Depression screen [facility] 2/9 06/15/2018 04/27/2017 04/07/2017  Decreased Interest 1 0 2  Down, Depressed, Hopeless 1 0 2  PHQ - 2 Score 2 0 4  Altered sleeping 1 0 2  Tired, decreased energy 1 0 2  Change in appetite 3 0 2  Feeling bad or failure about yourself  1 0 2  Trouble  concentrating 0 0 3  Moving slowly or fidgety/restless 0 0 2  Suicidal thoughts 0 0 0  PHQ-9 Score 8 0 17  Difficult doing work/chores Not difficult at all - -   GAD 7 : Generalized Anxiety Score 06/15/2018  Nervous, Anxious, on Edge 0  Control/stop worrying 1  Worry too much - different things 1  Trouble relaxing 1  Restless 1  Easily annoyed or irritable 0  Afraid - awful might happen 0  Total GAD 7 Score 4  Anxiety Difficulty Not difficult at all     Relevant past medical, surgical, family and social history reviewed and updated as indicated. Interim medical history since our last visit reviewed. Allergies and medications reviewed and updated.  Review of Systems  Per HPI unless specifically indicated above     Objective:    There were no vitals taken for this visit.  Wt Readings from Last 3 Encounters:  04/27/17 283 lb 6.4 oz (128.5 kg)  04/07/17 283 lb 9.6 oz (128.6 kg)  10/04/15 283 lb (128.4 kg)    Physical Exam Vitals signs and nursing note reviewed.  Constitutional:      General: She is not in acute distress.    Appearance: Normal appearance.  HENT:     Head: Atraumatic.     Right Ear: External ear normal.     Left Ear: External ear normal.  Nose: Nose normal. No congestion.     Mouth/Throat:     Mouth: Mucous membranes are moist.     Pharynx: Oropharynx is clear. No posterior oropharyngeal erythema.  Eyes:     Extraocular Movements: Extraocular movements intact.     Conjunctiva/sclera: Conjunctivae normal.  Neck:     Musculoskeletal: Normal range of motion.  Cardiovascular:     Comments: Unable to assess via virtual visit Pulmonary:     Effort: Pulmonary effort is normal. No respiratory distress.  Musculoskeletal: Normal range of motion.  Skin:    General: Skin is dry.     Findings: No erythema.  Neurological:     Mental Status: She is alert and oriented to person, place, and time.  Psychiatric:        Mood and Affect: Mood normal.         Thought Content: Thought content normal.        Judgment: Judgment normal.     Results for orders placed or performed in visit on 10/04/15  WET PREP FOR TRICH, YEAST, CLUE  Result Value Ref Range   Trichomonas Exam Negative Negative   Yeast Exam Negative Negative   Clue Cell Exam Negative Negative  Pap Lb, rfx HPV ASCU  Result Value Ref Range   DIAGNOSIS: Comment    Specimen adequacy: Comment    Clinician Provided ICD10 Comment    Performed by: Comment    PAP Smear Comment .    Note: Comment    PAP Reflex Comment       Assessment & Plan:   Problem List Items Addressed This Visit      Other   Depression, major, single episode, severe (HCC) - Primary    Agreeable to trying higher dose of wellbutrin to get better control over sxs as medication is helping but still having some breakthrough sxs.       Relevant Medications   buPROPion (WELLBUTRIN XL) 300 MG 24 hr tablet   Obesity (BMI 35.0-39.9 without comorbidity)    Discussed some options, including nutrition referral or medications strictly for weight loss. Patient agreeable to increasing wellbutrin to try and get more craving control benefit as well as better anxiety/mood benefit. Will also work on improving diet and exercise further. Recheck in 1 month          Follow up plan: Return in about 4 weeks (around 07/13/2018) for Mood f/u, CPE.

## 2018-06-15 NOTE — Assessment & Plan Note (Signed)
Agreeable to trying higher dose of wellbutrin to get better control over sxs as medication is helping but still having some breakthrough sxs.

## 2018-07-06 ENCOUNTER — Telehealth: Payer: Self-pay | Admitting: Nurse Practitioner

## 2018-07-06 NOTE — Telephone Encounter (Signed)
Noted, Apolonio Schneiders saw last visit.

## 2018-07-06 NOTE — Telephone Encounter (Signed)
Copied from Rosalie [id number]. Topic: Quick Communication Journalist, newspaper Patient (Clinic Use ONLY) >> Jun 21, 2018  9:47 AM Amada Kingfisher, CMA wrote: Reason for CRM: Called pt to scheduled 1 month CPE/Mood f/u >> Jul 06, 2018 12:45 PM Amada Kingfisher, CMA wrote: Letter generated and mailed. Will route to provider as FYI.   >> Jul 06, 2018 12:43 PM Amada Kingfisher, CMA wrote: Attempted to reach pt. VM box was not set up. Will send letter.   >> Jul 04, 2018 11:21 AM Amada Kingfisher, CMA wrote: Left message on machine for pt to return call to the office.  >> Jun 22, 2018 10:15 AM Amada Kingfisher, CMA wrote: Attempted to reach. VM box not set up.

## 2018-07-12 ENCOUNTER — Other Ambulatory Visit: Payer: Self-pay | Admitting: Family Medicine

## 2018-08-04 ENCOUNTER — Telehealth: Payer: Self-pay | Admitting: Unknown Physician Specialty

## 2018-08-04 DIAGNOSIS — Z20828 Contact with and (suspected) exposure to other viral communicable diseases: Secondary | ICD-10-CM

## 2018-08-04 DIAGNOSIS — Z20822 Contact with and (suspected) exposure to covid-19: Secondary | ICD-10-CM

## 2018-08-04 NOTE — Telephone Encounter (Signed)
Pt want to get tested for covid due to some of her co-workers tested positive and she is now having sorethroat and bodyaches

## 2018-08-04 NOTE — Telephone Encounter (Signed)
Order placed

## 2018-08-05 NOTE — Telephone Encounter (Signed)
Pt would like a work note that she is being tested for covid and how many days she will need to be out. Please call [phone] if any questions

## 2018-08-05 NOTE — Telephone Encounter (Signed)
Needs appt

## 2018-08-08 NOTE — Telephone Encounter (Signed)
Called pt- no answer, VM not set up 

## 2018-08-09 NOTE — Telephone Encounter (Signed)
Called pt, no answer.

## 2018-08-09 NOTE — Telephone Encounter (Signed)
Pt called in to inquire about missed call. Advised pt that she would need to have an appt per Claire Walker for note. Pt is going to see if her job actually requires the note before scheduling.

## 2018-08-10 LAB — NOVEL CORONAVIRUS, NAA: SARS-CoV-2, NAA: NOT DETECTED

## 2018-08-13 ENCOUNTER — Other Ambulatory Visit: Payer: Self-pay | Admitting: Family Medicine

## 2018-08-14 ENCOUNTER — Other Ambulatory Visit: Payer: Self-pay | Admitting: Unknown Physician Specialty

## 2018-08-14 NOTE — Telephone Encounter (Signed)
Requested Prescriptions  Pending Prescriptions Disp Refills  . JUNEL FE 1/20 1-20 MG-MCG tablet [Pharmacy Med Name: JUNEL FE 1 MG-20 MCG TABLET] 28 tablet 7    Sig: TAKE 1 TABLET BY MOUTH EVERY DAY     OB/GYN:  Contraceptives Passed - 08/14/2018 10:02 AM      Passed - Last BP in normal range    BP Readings from Last 1 Encounters:  04/27/17 127/80         Passed - Valid encounter within last 12 months    Recent Outpatient Visits          2 months ago Depression, major, single episode, severe ([place])   [place], Lilia Argue, PA-C   1 year ago Depression, major, single episode, severe ([place])   [place] Kathrine Haddock, NP   1 year ago Depression, major, single episode, severe ([place])   [place], NP   2 years ago Vaginal itching   [place], NP   2 years ago Ankle injury, right, initial encounter   [facility] Volney American, [place]

## 2018-08-15 ENCOUNTER — Ambulatory Visit (INDEPENDENT_AMBULATORY_CARE_PROVIDER_SITE_OTHER): Payer: Commercial Managed Care - PPO | Admitting: Family Medicine

## 2018-08-15 ENCOUNTER — Other Ambulatory Visit: Payer: Self-pay

## 2018-08-15 ENCOUNTER — Encounter: Payer: Self-pay | Admitting: Family Medicine

## 2018-08-15 ENCOUNTER — Other Ambulatory Visit: Payer: Self-pay | Admitting: Family Medicine

## 2018-08-15 VITALS — Ht 68.0 in | Wt 315.0 lb

## 2018-08-15 DIAGNOSIS — J029 Acute pharyngitis, unspecified: Secondary | ICD-10-CM | POA: Diagnosis not present

## 2018-08-15 DIAGNOSIS — R519 Headache, unspecified: Secondary | ICD-10-CM

## 2018-08-15 DIAGNOSIS — R51 Headache: Secondary | ICD-10-CM

## 2018-08-15 MED ORDER — PREDNISONE 20 MG PO TABS: 40.0000 mg | ORAL_TABLET | Freq: Every day | ORAL | 0 refills | Status: DC

## 2018-08-15 NOTE — Telephone Encounter (Signed)
Requested medications are due for refill today?  Yes  Requested medications are on the active medication list?  Yes  Last refill 07/12/2018  Future visit scheduled?  Patient was seen today  Notes to clinic - Patient had appointment but mood was not addressed per office note.  Please advise on refills.   Requested Prescriptions  Pending Prescriptions Disp Refills   buPROPion (WELLBUTRIN XL) 300 MG 24 hr tablet [Pharmacy Med Name: BUPROPION HCL XL 300 MG TABLET] 30 tablet 0    Sig: Take 1 tablet (300 mg total) by mouth daily. DUE FOR OFFICE VISIT     Psychiatry: Antidepressants - bupropion Passed - 08/15/2018  3:15 PM      Passed - Last BP in normal range    BP Readings from Last 1 Encounters:  04/27/17 127/80         Passed - Valid encounter within last 6 months    Recent Outpatient Visits          Today Sore throat   [facility] Volney American, [place]   2 months ago Depression, major, single episode, severe [facility])   [facility] Volney American, [place]   1 year ago Depression, major, single episode, severe ([place])   [place] Kathrine Haddock, NP   1 year ago Depression, major, single episode, severe ([place])   [place], NP   2 years ago Vaginal itching   [facility] Kathrine Haddock, NP             Passed - Completed PHQ-2 or PHQ-9 in the last 360 days.

## 2018-08-15 NOTE — Telephone Encounter (Signed)
Needs appt for this, was not discussed today as today's visit was about her COVID 19 testing. She was due for this f/u over a month ago. Will bridge medication

## 2018-08-15 NOTE — Telephone Encounter (Signed)
Called pt to schedule appt for medication, no answer, vm not set up

## 2018-08-15 NOTE — Progress Notes (Signed)
Ht 5\' 8"  (1.727 m)   Wt (!) 315 lb (142.9 kg)   BMI 47.90 kg/m    Subjective:    Patient ID: Claire Walker, female    DOB: 1995/12/24, 23 y.o.   MRN: [id number]  HPI: Claire Walker is a 23 y.o. female  Chief Complaint  Patient presents with  . Work Release    Needs note for returning to work. COVID testing negative. Leave was approved till Thursday.     . This visit was completed via WebEx due to the restrictions of the COVID-19 pandemic. All issues as above were discussed and addressed. Physical exam was done as above through visual confirmation on WebEx. If it was felt that the patient should be evaluated in the office, they were directed there. The patient verbally consented to this visit. . Location of the patient: home . Location of the provider: home . Those involved with this call:  . Provider: Merrie Roof, PA-C . CMA: Merilyn Baba, [place] . Front Desk/Registration: Jill Side  . Time spent on call: 15 minutes with patient face to face via video conference. More than 50% of this time was spent in counseling and coordination of care. 5 minutes total spent in review of patient's record and preparation of their chart. I verified patient identity using two factors (patient name and date of birth). Patient consents verbally to being seen via telemedicine visit today.   Left work 7/17 for sore throat, body aches, headache so work asked she get tested for COVID 19. Results were negative and sxs have resolved aside from a persistent migraine. Excedrin helping temporarily but not taking it away. Denies fevers, chills, CP, SOB, sore throat. Has been strictly quarantined at home since onset.   Relevant past medical, surgical, family and social history reviewed and updated as indicated. Interim medical history since our last visit reviewed. Allergies and medications reviewed and updated.  Review of Systems  Per HPI unless specifically indicated above     Objective:    Ht 5\' 8"   (1.727 m)   Wt (!) 315 lb (142.9 kg)   BMI 47.90 kg/m   Wt Readings from Last 3 Encounters:  08/15/18 (!) 315 lb (142.9 kg)  04/27/17 283 lb 6.4 oz (128.5 kg)  04/07/17 283 lb 9.6 oz (128.6 kg)    Physical Exam Vitals signs and nursing note reviewed.  Constitutional:      General: She is not in acute distress.    Appearance: Normal appearance.  HENT:     Head: Atraumatic.     Right Ear: External ear normal.     Left Ear: External ear normal.     Nose: Nose normal. No congestion.     Mouth/Throat:     Mouth: Mucous membranes are moist.     Pharynx: Oropharynx is clear. No posterior oropharyngeal erythema.  Eyes:     Extraocular Movements: Extraocular movements intact.     Conjunctiva/sclera: Conjunctivae normal.  Neck:     Musculoskeletal: Normal range of motion.  Cardiovascular:     Comments: Unable to assess via virtual visit Pulmonary:     Effort: Pulmonary effort is normal. No respiratory distress.  Musculoskeletal: Normal range of motion.  Skin:    General: Skin is dry.     Findings: No erythema.  Neurological:     Mental Status: She is alert and oriented to person, place, and time.  Psychiatric:        Mood and Affect: Mood normal.  Thought Content: Thought content normal.        Judgment: Judgment normal.     Results for orders placed or performed in visit on 08/04/18  Novel Coronavirus, NAA ([facility])  Result Value Ref Range   SARS-CoV-2, NAA Not Detected Not Detected      Assessment & Plan:   Problem List Items Addressed This Visit    None    Visit Diagnoses    Sore throat    -  Primary   Resolved, COVID 19 negative. Will release back to work 7/30 without restrictions   Nonintractable episodic headache, unspecified headache type       Sinus vs migraine. Tx with flonase, prednisone, tylenol prn. F/u if not resolving       Follow up plan: Return if symptoms worsen or fail to improve.

## 2018-08-16 NOTE — Telephone Encounter (Signed)
Has already been seen and given note.   Copied from [place] [id number]. Topic: Appointment Scheduling - Scheduling Inquiry for Clinic >> Aug 15, 2018  8:38 AM Celene Kras A wrote: Reason for CRM: Pt called stating she is needing a follow appt for a note to go back to work after a negative corona virus test. Please advise.

## 2018-09-07 ENCOUNTER — Other Ambulatory Visit: Payer: Self-pay | Admitting: Family Medicine

## 2018-10-21 ENCOUNTER — Other Ambulatory Visit: Payer: Self-pay | Admitting: Family Medicine

## 2018-11-28 ENCOUNTER — Other Ambulatory Visit: Payer: Self-pay | Admitting: Family Medicine

## 2019-01-02 ENCOUNTER — Encounter: Payer: Self-pay | Admitting: Family Medicine

## 2019-01-02 ENCOUNTER — Ambulatory Visit (INDEPENDENT_AMBULATORY_CARE_PROVIDER_SITE_OTHER): Payer: Commercial Managed Care - PPO | Admitting: Family Medicine

## 2019-01-02 ENCOUNTER — Telehealth: Payer: Self-pay | Admitting: Family Medicine

## 2019-01-02 ENCOUNTER — Other Ambulatory Visit: Payer: Self-pay

## 2019-01-02 VITALS — BP 125/79 | HR 88 | Temp 97.9°F | Ht 72.0 in | Wt 333.0 lb

## 2019-01-02 DIAGNOSIS — Z3201 Encounter for pregnancy test, result positive: Secondary | ICD-10-CM

## 2019-01-02 LAB — PREGNANCY, URINE: Preg Test, Ur: POSITIVE — AB

## 2019-01-02 NOTE — Telephone Encounter (Signed)
Needs appt, have no information on file regarding a pregnancy and needing more information  Copied from [place] [id number]. Topic: General - Other >> Jan 02, 2019  8:22 AM Celene Kras wrote: Reason for CRM: Pt called stating that she is pregnant and that she is experiencing lots of pressure and movement. Pt is requesting to have information and to know if this is normal. Please advise.

## 2019-01-02 NOTE — Telephone Encounter (Signed)
Pt has an appointment this afternoon but is going to ask her manager about coming in to see Claire Walker this morning at 10.

## 2019-01-02 NOTE — Progress Notes (Signed)
BP 125/79   Pulse 88   Temp 97.9 F (36.6 C) (Oral)   Ht 6' (1.829 m)   Wt (!) 333 lb (151 kg)   SpO2 99%   BMI 45.16 kg/m    Subjective:    Patient ID: Claire Walker, female    DOB: 12-03-1995, 23 y.o.   MRN: [id number]  HPI: Claire Walker is a 23 y.o. female  Chief Complaint  Patient presents with  . Follow-up    positive home pregnancy test last Thusday, per patient   Patient presenting today after a positive home pregnancy test. This is her first pregnancy. Has been having heartburn badly, stomach feeling strange, back spasms and thought it was bad gas. Has also been dealing with weight gain the past few months, noting no matter how much or little she ate she was having issues buttoning her pants. Took a test last week and it was positive. Feeling strong kicks now over the weekend which has caused significant anxiety about how far along she may be and if the baby is healthy as she notes she has occasionally had some wine here and there. Takes continuous birth control so has not had a cycle in over a year per patient. Did forget some days on her birth control the past few months now that she thinks back on it. Denies N/V, vaginal discharge or bleeding, abdominal pain.   Relevant past medical, surgical, family and social history reviewed and updated as indicated. Interim medical history since our last visit reviewed. Allergies and medications reviewed and updated.  Review of Systems  Per HPI unless specifically indicated above     Objective:    BP 125/79   Pulse 88   Temp 97.9 F (36.6 C) (Oral)   Ht 6' (1.829 m)   Wt (!) 333 lb (151 kg)   SpO2 99%   BMI 45.16 kg/m   Wt Readings from Last 3 Encounters:  01/02/19 (!) 333 lb (151 kg)  08/15/18 (!) 315 lb (142.9 kg)  04/27/17 283 lb 6.4 oz (128.5 kg)    Physical Exam Vitals and nursing note reviewed.  Constitutional:      Appearance: Normal appearance. She is not ill-appearing.  HENT:     Head: Atraumatic.  Eyes:     Extraocular Movements: Extraocular movements intact.     Conjunctiva/sclera: Conjunctivae normal.  Cardiovascular:     Rate and Rhythm: Normal rate and regular rhythm.     Heart sounds: Normal heart sounds.  Pulmonary:     Effort: Pulmonary effort is normal.     Breath sounds: Normal breath sounds.  Abdominal:     General: Bowel sounds are normal.     Tenderness: There is no abdominal tenderness.     Comments: Difficult to determine fundal height due to pannus, but believe the fundus to be palpable an inch or more above the umbilicus  Musculoskeletal:        General: Normal range of motion.     Cervical back: Normal range of motion and neck supple.  Skin:    General: Skin is warm and dry.  Neurological:     Mental Status: She is alert and oriented to person, place, and time.  Psychiatric:        Mood and Affect: Mood normal.        Thought Content: Thought content normal.        Judgment: Judgment normal.     Results for orders placed or performed in visit on  01/02/19  Pregnancy, urine  Result Value Ref Range   Preg Test, Ur Positive (A) Negative  Beta HCG, Quant  Result Value Ref Range   hCG Quant 2,332 mIU/mL      Assessment & Plan:   Problem List Items Addressed This Visit    None    Visit Diagnoses    Positive pregnancy test    -  Primary   Relevant Orders   Pregnancy, urine (Completed)   Beta HCG, Quant (Completed)   Ambulatory referral to Gynecology      Urgent referral placed to GYN to initiate OB care given potentially advanced stage of pregnancy. Suspect her to be at or above 20 weeks based on sxs and exam but await u/s for dating. Long discussion about diet, exercise, lifestyle modifications, safe and unsafe medications during pregnancy. Pt to start a good prenatal vitamin and follow closely with OBGYN for care.   Greater than 25 minutes spent today in direct patient care and counseling.   Follow up plan: Return for Wednesday with OBGYN.

## 2019-01-03 LAB — BETA HCG QUANT (REF LAB): hCG Quant: 2332 m[IU]/mL

## 2019-01-04 ENCOUNTER — Other Ambulatory Visit: Payer: Self-pay

## 2019-01-04 ENCOUNTER — Encounter: Payer: Self-pay | Admitting: Obstetrics and Gynecology

## 2019-01-04 ENCOUNTER — Other Ambulatory Visit (HOSPITAL_COMMUNITY)
Admission: RE | Admit: 2019-01-04 | Discharge: 2019-01-04 | Disposition: A | Discharge status: Home / Self Care | Payer: Commercial Managed Care - PPO | Source: Ambulatory Visit | Attending: Obstetrics and Gynecology | Admitting: Obstetrics and Gynecology

## 2019-01-04 ENCOUNTER — Encounter: Payer: Self-pay | Admitting: Family Medicine

## 2019-01-04 ENCOUNTER — Ambulatory Visit (INDEPENDENT_AMBULATORY_CARE_PROVIDER_SITE_OTHER): Payer: Commercial Managed Care - PPO | Admitting: Obstetrics and Gynecology

## 2019-01-04 VITALS — BP 148/89 | HR 100 | Wt 330.0 lb

## 2019-01-04 DIAGNOSIS — Z124 Encounter for screening for malignant neoplasm of cervix: Secondary | ICD-10-CM | POA: Diagnosis present

## 2019-01-04 DIAGNOSIS — Z113 Encounter for screening for infections with a predominantly sexual mode of transmission: Secondary | ICD-10-CM | POA: Diagnosis present

## 2019-01-04 DIAGNOSIS — Z3A34 34 weeks gestation of pregnancy: Secondary | ICD-10-CM

## 2019-01-04 DIAGNOSIS — O99213 Obesity complicating pregnancy, third trimester: Secondary | ICD-10-CM

## 2019-01-04 DIAGNOSIS — Z31438 Encounter for other genetic testing of female for procreative management: Secondary | ICD-10-CM

## 2019-01-04 DIAGNOSIS — Z363 Encounter for antenatal screening for malformations: Secondary | ICD-10-CM

## 2019-01-04 DIAGNOSIS — O99210 Obesity complicating pregnancy, unspecified trimester: Secondary | ICD-10-CM

## 2019-01-04 DIAGNOSIS — R030 Elevated blood-pressure reading, without diagnosis of hypertension: Secondary | ICD-10-CM

## 2019-01-04 DIAGNOSIS — Z3689 Encounter for other specified antenatal screening: Secondary | ICD-10-CM

## 2019-01-04 DIAGNOSIS — Z3400 Encounter for supervision of normal first pregnancy, unspecified trimester: Secondary | ICD-10-CM | POA: Diagnosis present

## 2019-01-04 LAB — OB RESULTS CONSOLE VARICELLA ZOSTER ANTIBODY, IGG: Varicella: IMMUNE

## 2019-01-04 NOTE — Progress Notes (Signed)
New Obstetric Patient H&P    Chief Complaint: "Desires prenatal care"   History of Present Illness: Patient is a 23 y.o. G1P0000 Not Hispanic or Latino female, presents with amenorrhea and positive home pregnancy test. Patient's last menstrual period was 05/05/2018 (lmp unknown). and based on her  LMP, her EDD is Estimated Date of Delivery: 02/09/19 and her EGA is [redacted]w[redacted]d.  This is an unsure LMP given the fact that the patient conceived on continuous OCP's.  She does report fetal movement.  Her last pap smear was 10/04/2015 and was no abnormalities.     Since her LMP she claims she has experienced fatigue and heartburn. She denies vaginal bleeding. Her past medical history is notable for obesity, depression.  Since her LMP, she admits to the use of tobacco products  no There are cats in the home in the home  no  She admits close contact with children on a regular basis  no  She has had chicken pox in the past yes She has had Tuberculosis exposures, symptoms, or previously tested positive for TB   no Current or past history of domestic violence. no  Genetic Screening/Teratology Counseling: (Includes patient, baby's father, or anyone in either family with:)   1. Patient's age >/= 52 at [facility]  no 2. Thalassemia ([place], [place], Mediterranean, or Asian background): MCV<80  no 3. Neural tube defect (meningomyelocele, spina bifida, anencephaly)  no 4. Congenital heart defect  no  5. Down syndrome  no 6. Tay-Sachs (Jewish, [place]  no 7. Canavan's Disease  no 8. Sickle cell disease or trait (African)  no  9. Hemophilia or other blood disorders  no  10. Muscular dystrophy  no  11. Cystic fibrosis  no  12. Huntington's Chorea  no  13. Mental retardation/autism  no 14. Other inherited genetic or chromosomal disorder  no 15. Maternal metabolic disorder (DM, PKU, etc)  no 16. Patient or FOB with a child with a birth defect not listed above no  16a. Patient or FOB with a birth defect  themselves no 17. Recurrent pregnancy loss, or stillbirth  no  18. Any medications since LMP other than prenatal vitamins (include vitamins, supplements, OTC meds, drugs, alcohol)  no 19. Any other genetic/environmental exposure to discuss  no  Infection History:   1. Lives with someone with TB or TB exposed  no  2. Patient or partner has history of genital herpes  no 3. Rash or viral illness since LMP  no 4. History of STI (GC, CT, HPV, syphilis, HIV)  no 5. History of recent travel :  no  Other pertinent information:  no     Review of Systems:10 point review of systems negative unless otherwise noted in HPI  Past Medical History:  History reviewed. No pertinent past medical history.  Past Surgical History:  Past Surgical History:  Procedure Laterality Date  . NO PAST SURGERIES      Gynecologic History: Patient's last menstrual period was 05/05/2018 (lmp unknown).  Obstetric History: G1P0000  Family History:  Family History  Problem Relation Age of Onset  . Arthritis Mother   . Asthma Mother   . Hypertension Mother   . Thyroid disease Mother   . Diabetes Father   . Hypertension Maternal Grandmother   . Cancer Maternal Grandfather        squamous cell carcinoma  . Hypertension Maternal Grandfather   . Diabetes Maternal Grandfather   . Hypertension Paternal Grandmother   . Hypertension Paternal Grandfather  Social History:  Social History   Socioeconomic History  . Marital status: Single    Spouse name: Not on file  . Number of children: Not on file  . Years of education: Not on file  . Highest education level: Not on file  Occupational History  . Occupation: [employer]  Tobacco Use  . Smoking status: Never Smoker  . Smokeless tobacco: Never Used  Substance and Sexual Activity  . Alcohol use: No    Alcohol/week: 0.0 standard drinks  . Drug use: No  . Sexual activity: Yes    Birth control/protection: Pill    Comment: [facility] OCP  Other Topics Concern    . Not on file  Social History Narrative  . Not on file   Social Determinants of Health   Financial Resource Strain:   . Difficulty of Paying Living Expenses: Not on file  Food Insecurity:   . Worried About Programme researcher, broadcasting/film/videounning Out of Food in the Last Year: Not on file  . Ran Out of Food in the Last Year: Not on file  Transportation Needs:   . Lack of Transportation (Medical): Not on file  . Lack of Transportation (Non-Medical): Not on file  Physical Activity:   . Days of Exercise per Week: Not on file  . Minutes of Exercise per Session: Not on file  Stress:   . Feeling of Stress : Not on file  Social Connections:   . Frequency of Communication with Friends and Family: Not on file  . Frequency of Social Gatherings with Friends and Family: Not on file  . Attends Religious Services: Not on file  . Active Member of Clubs or Organizations: Not on file  . Attends BankerClub or Organization Meetings: Not on file  . Marital Status: Not on file  Intimate Partner Violence:   . Fear of Current or Ex-Partner: Not on file  . Emotionally Abused: Not on file  . Physically Abused: Not on file  . Sexually Abused: Not on file    Allergies:  No Known Allergies  Medications: Prior to Admission medications   Not on File    Physical Exam Vitals: Blood pressure (!) 148/89, pulse 100, weight (!) 330 lb (149.7 kg), last menstrual period 05/05/2018. Body mass index is 44.76 kg/m.  General: NAD HEENT: normocephalic, anicteric Thyroid: no enlargement, no palpable nodules Pulmonary: No increased work of breathing, CTAB Cardiovascular: RRR, distal pulses 2+ Abdomen: NABS, soft, non-tender, non-distended.  Umbilicus without lesions.  No hepatomegaly, splenomegaly. Gravid 23-25 week size uterus, FHT 160 Genitourinary:  External: Normal external female genitalia.  Normal urethral meatus, normal  Bartholin's and Skene's glands.    Vagina: Normal vaginal mucosa, no evidence of prolapse.    Cervix: Grossly normal  in appearance, no bleeding  Uterus: Grossly enlarged, mobile, normal contour.  No CMT  Adnexa: ovaries non-enlarged, no adnexal masses  Rectal: deferred Extremities: no edema, erythema, or tenderness Neurologic: Grossly intact Psychiatric: mood appropriate, affect full   Assessment: 23 y.o. G1P0000 at 3745w6d presenting to initiate prenatal care  Plan: 1) Avoid alcoholic beverages. 2) Patient encouraged not to smoke.  3) Discontinue the use of all non-medicinal drugs and chemicals.  4) Take prenatal vitamins daily.  5) Nutrition, food safety (fish, cheese advisories, and high nitrite foods) and exercise discussed. 6) Hospital and practice style discussed with cross coverage system.  7) First time pregnancy inheritest ordred 8) Mild range BP today P/C ratio, CMP collected 9) Return in about 1 week (around 01/11/2019) for ROB and anatomy scan.  Malachy Mood, MD, [facility], [facility] 01/04/2019, 2:52 PM

## 2019-01-04 NOTE — Progress Notes (Signed)
NOB Pt has been on continuous OCP x1 year LMP unknown Referred by [facility] Pelvic pressure

## 2019-01-06 LAB — CYTOLOGY - PAP
Chlamydia: NEGATIVE
Comment: NEGATIVE
Comment: NORMAL
Diagnosis: NEGATIVE
Neisseria Gonorrhea: NEGATIVE

## 2019-01-07 LAB — URINE CULTURE

## 2019-01-10 ENCOUNTER — Inpatient Hospital Stay
Admission: EM | Admit: 2019-01-10 | Discharge: 2019-01-10 | Disposition: A | Discharge status: Home / Self Care | Payer: Commercial Managed Care - PPO | Attending: Obstetrics and Gynecology | Admitting: Obstetrics and Gynecology

## 2019-01-10 ENCOUNTER — Telehealth: Payer: Self-pay

## 2019-01-10 ENCOUNTER — Emergency Department: Payer: Commercial Managed Care - PPO

## 2019-01-10 ENCOUNTER — Other Ambulatory Visit: Payer: Self-pay

## 2019-01-10 DIAGNOSIS — I10 Essential (primary) hypertension: Secondary | ICD-10-CM

## 2019-01-10 DIAGNOSIS — O163 Unspecified maternal hypertension, third trimester: Secondary | ICD-10-CM | POA: Insufficient documentation

## 2019-01-10 DIAGNOSIS — Z3A32 32 weeks gestation of pregnancy: Secondary | ICD-10-CM | POA: Diagnosis not present

## 2019-01-10 LAB — PROTEIN / CREATININE RATIO, URINE
Creatinine, Urine: 96 mg/dL
Protein Creatinine Ratio: 0.08 mg/mg{Cre} (ref 0.00–0.15)
Total Protein, Urine: 8 mg/dL

## 2019-01-10 LAB — URINALYSIS, COMPLETE (UACMP) WITH MICROSCOPIC
Bilirubin Urine: NEGATIVE
Glucose, UA: NEGATIVE mg/dL
Hgb urine dipstick: NEGATIVE
Ketones, ur: NEGATIVE mg/dL
Leukocytes,Ua: NEGATIVE
Nitrite: NEGATIVE
Protein, ur: NEGATIVE mg/dL
Specific Gravity, Urine: 1.017 (ref 1.005–1.030)
pH: 6 (ref 5.0–8.0)

## 2019-01-10 LAB — CBC
HCT: 35 % — ABNORMAL LOW (ref 36.0–46.0)
Hemoglobin: 11.6 g/dL — ABNORMAL LOW (ref 12.0–15.0)
MCH: 30.8 pg (ref 26.0–34.0)
MCHC: 33.1 g/dL (ref 30.0–36.0)
MCV: 92.8 fL (ref 80.0–100.0)
Platelets: 195 10*3/uL (ref 150–400)
RBC: 3.77 MIL/uL — ABNORMAL LOW (ref 3.87–5.11)
RDW: 12.1 % (ref 11.5–15.5)
WBC: 12.1 10*3/uL — ABNORMAL HIGH (ref 4.0–10.5)
nRBC: 0 % (ref 0.0–0.2)

## 2019-01-10 LAB — HEPATIC FUNCTION PANEL
ALT: 19 U/L (ref 0–44)
AST: 20 U/L (ref 15–41)
Albumin: 3.3 g/dL — ABNORMAL LOW (ref 3.5–5.0)
Alkaline Phosphatase: 91 U/L (ref 38–126)
Bilirubin, Direct: <0.1 mg/dL (ref 0.0–0.2)
Total Bilirubin: 0.5 mg/dL (ref 0.3–1.2)
Total Protein: 6.6 g/dL (ref 6.5–8.1)

## 2019-01-10 LAB — HCG, QUANTITATIVE, PREGNANCY: hCG, Beta Chain, Quant, S: 3279 m[IU]/mL — ABNORMAL HIGH (ref <5)

## 2019-01-10 LAB — BASIC METABOLIC PANEL
Anion gap: 11 (ref 5–15)
BUN: 10 mg/dL (ref 6–20)
CO2: 20 mmol/L — ABNORMAL LOW (ref 22–32)
Calcium: 9.2 mg/dL (ref 8.9–10.3)
Chloride: 106 mmol/L (ref 98–111)
Creatinine, Ser: 0.5 mg/dL (ref 0.44–1.00)
GFR calc Af Amer: >60 mL/min (ref >60)
GFR calc non Af Amer: >60 mL/min (ref >60)
Glucose, Bld: 100 mg/dL — ABNORMAL HIGH (ref 70–99)
Potassium: 4.1 mmol/L (ref 3.5–5.1)
Sodium: 137 mmol/L (ref 135–145)

## 2019-01-10 LAB — POCT PREGNANCY, URINE: Preg Test, Ur: POSITIVE — AB

## 2019-01-10 MED ORDER — NITROFURANTOIN MONOHYD MACRO 100 MG PO CAPS: 100.0000 mg | ORAL_CAPSULE | Freq: Two times a day (BID) | ORAL | 0 refills | Status: AC

## 2019-01-10 MED ORDER — ACETAMINOPHEN 325 MG PO TABS
650.0000 mg | ORAL_TABLET | Freq: Once | ORAL | Status: AC | PRN
  Administered 2019-01-10: 650 mg via ORAL
  Filled 2019-01-10: qty 2

## 2019-01-10 NOTE — Telephone Encounter (Signed)
Pt report her bp is 148/82 she feels bad, pt reports being SOB, I advised her to go to the ER,

## 2019-01-10 NOTE — Discharge Summary (Signed)
Physician Discharge Summary   Patient ID: Claire Walker [id number] 23 y.o. [birth year]  Admit date: 01/10/2019  Discharge date and time: No discharge date for patient encounter.   Admitting Physician: Homero Fellers, MD   Discharge Physician: Adrian Prows MD  Admission Diagnoses: [redacted] weeks gestation of pregnancy [Z3A.32] Hypertension, unspecified type [I10]  Discharge Diagnoses: [redacted] weeks gestation, gestation hypertension versus chronic hypertension, late prenatal care.  Admission Condition: good  Discharged Condition: good  Indication for Admission: Blood pressure evaluation.  Hospital Course: [name] presented from work today. She was not feeling well. "Felt like something was off." She had a little bit of a headache which has now improved. She denies vision changes. She denies RUQ pain. She does not endorse a history of prior hypertension. She works in [place] and was able to check her BP at work. She noted that it was elevated at work in the [place]. This was similar to her in office BP. She notes that she has been feeling dry and drinking lots of water. She does report increased urinary frequency. She denies pain with urination.  Sent prescription for UTI.  Discussed warning signs of preeclampsia. Encouraged patient to maintain log of BP. Discussed critical BP values and to call the office or come to the hospital with concerns.  Will keep close office follow up tomorrow.   Consults: None  Significant Diagnostic Studies: labs: See EPIC  Treatments: tylenol  Discharge Exam: BP 131/79   Pulse 94   Temp 97.9 F (36.6 C) (Oral)   Resp 16   Ht 6' (1.829 m)   Wt (!) 149.7 kg   LMP 05/05/2018 (LMP Unknown)   SpO2 98%   BMI 44.76 kg/m   General Appearance:    Alert, cooperative, no distress, appears stated age  Head:    Normocephalic, without obvious abnormality, atraumatic  Eyes:    PERRL, conjunctiva/corneas clear, EOM's intact, fundi    benign, both eyes  Ears:     Normal TM's and external ear canals, both ears  Nose:   Nares normal, septum midline, mucosa normal, no drainage    or sinus tenderness  Throat:   Lips, mucosa, and tongue normal; teeth and gums normal  Neck:   Supple, symmetrical, trachea midline, no adenopathy;    thyroid:  no enlargement/tenderness/nodules; no carotid   bruit or JVD  Back:     Symmetric, no curvature, ROM normal, no CVA tenderness  Lungs:     Clear to auscultation bilaterally, respirations unlabored  Chest Wall:    No tenderness or deformity   Heart:    Regular rate and rhythm, S1 and S2 normal, no murmur, rub   or gallop  Breast Exam:    No tenderness, masses, or nipple abnormality  Abdomen:     Soft, non-tender, bowel sounds active all four quadrants,    no masses, no organomegaly  Genitalia:    Normal female without lesion, discharge or tenderness  Rectal:    Normal tone, normal prostate, no masses or tenderness;   guaiac negative stool  Extremities:   Extremities normal, atraumatic, no cyanosis or edema  Pulses:   2+ and symmetric all extremities  Skin:   Skin color, texture, turgor normal, no rashes or lesions  Lymph nodes:   Cervical, supraclavicular, and axillary nodes normal  Neurologic:   CNII-XII intact, normal strength, sensation and reflexes    Throughout. No evidence of klonus on exam.    Disposition: Discharge disposition: 01-Home or Self Care  Patient Instructions:  Allergies as of 01/10/2019   No Known Allergies     Medication List    TAKE these medications   nitrofurantoin (macrocrystal-monohydrate) 100 MG capsule Commonly known as: Macrobid Take 1 capsule (100 mg total) by mouth 2 (two) times daily for 5 days.      Activity: activity as tolerated Diet: regular diet Wound Care: none needed  Follow-up with [facility] in 1 day.  Signed: Natale Milch 01/10/2019 6:14 PM

## 2019-01-10 NOTE — Discharge Instructions (Signed)
Please go upstairs to L&D for additional BP monitoring.

## 2019-01-10 NOTE — ED Triage Notes (Signed)
Pt states she just found out she was pregnant recently and has been having issues with HTN, states she saw PCP last week and OB at [facility] [place] and they were guessing around 30 weeks by blood test and she is suppose to have ultrasound tomorrow. Denies any abd pain, bleeding or other sx. States this is her first pregnancy.

## 2019-01-10 NOTE — ED Notes (Signed)
See triage note  Presents with elevated b/p  States she recently found out she was pregnant  States she was on BCP and did not have a period   Pt is [redacted] weeks pregnant

## 2019-01-10 NOTE — Progress Notes (Signed)
Pt G1P0 [redacted]w[redacted]d presents to L&D for [facility] evaluation. Labs sent from ED and RN in L&D applied FHR monitors and cycled BP q25min.Pt states she has a slight headache but denies any other symptoms of PIH. Monitors applied and assessing.

## 2019-01-10 NOTE — ED Provider Notes (Signed)
[facility] Emergency Department Provider Note    First MD Initiated Contact with Patient 01/10/19 1437     (approximate)  I have reviewed the triage vital signs and the nursing notes.   HISTORY  Chief Complaint Hypertension    HPI Claire Walker is a 23 y.o. female presents the ER for evaluation of elevated blood pressure.  Patient just recently learned that she is over [redacted] weeks pregnant. G1P0000.  Had noticed some weight gain but thought it was due to staying home during the Covid pandemic.  Denies any chest pain.  Has had some worsening exertional dyspnea over the past several weeks.  No headaches.  Denies any previous history of hypertension.  Does not take any blood pressure medication.  Did see [facility] to establish care last week.  Is not having any cramping vaginal discharge or bleeding.     History reviewed. No pertinent past medical history. Family History  Problem Relation Age of Onset  . Arthritis Mother   . Asthma Mother   . Hypertension Mother   . Thyroid disease Mother   . Diabetes Father   . Hypertension Maternal Grandmother   . Cancer Maternal Grandfather        squamous cell carcinoma  . Hypertension Maternal Grandfather   . Diabetes Maternal Grandfather   . Hypertension Paternal Grandmother   . Hypertension Paternal Grandfather    Past Surgical History:  Procedure Laterality Date  . NO PAST SURGERIES     Patient Active Problem List   Diagnosis Date Noted  . Obesity (BMI 35.0-39.9 without comorbidity) 04/27/2017  . Depression, major, single episode, severe ([place]) 04/07/2017  . Allergic rhinitis 10/11/2014      Prior to Admission medications   Not on File    Allergies Patient has no known allergies.    Social History Social History   Tobacco Use  . Smoking status: Never Smoker  . Smokeless tobacco: Never Used  Substance Use Topics  . Alcohol use: No    Alcohol/week: 0.0 standard drinks  . Drug use: No     Review of Systems Patient denies headaches, rhinorrhea, blurry vision, numbness, shortness of breath, chest pain, edema, cough, abdominal pain, nausea, vomiting, diarrhea, dysuria, fevers, rashes or hallucinations unless otherwise stated above in HPI. ____________________________________________   PHYSICAL EXAM:  VITAL SIGNS: Vitals:   01/10/19 1313 01/10/19 1511  BP: (!) 162/84 139/74  Pulse: 89   Resp: 18   Temp: 98.5 F (36.9 C)   SpO2: 98%     Constitutional: Alert and oriented.  Eyes: Conjunctivae are normal.  Head: Atraumatic. Nose: No congestion/rhinnorhea. Mouth/Throat: Mucous membranes are moist.   Neck: No stridor. Painless ROM.  Cardiovascular: Normal rate, regular rhythm. Grossly normal heart sounds.  Good peripheral circulation. Respiratory: Normal respiratory effort.  No retractions. Lungs CTAB. Gastrointestinal: Soft and nontender. No distention. No abdominal bruits. No CVA tenderness. Genitourinary: deferred Musculoskeletal: No lower extremity tenderness nor edema.  No joint effusions. Neurologic:  Normal speech and language. No gross focal neurologic deficits are appreciated. No facial droop Skin:  Skin is warm, dry and intact. No rash noted. Psychiatric: Mood and affect are normal. Speech and behavior are normal.  ____________________________________________   LABS (all labs ordered are listed, but only abnormal results are displayed)  Results for orders placed or performed during the hospital encounter of 01/10/19 (from the past 24 hour(s))  Basic metabolic panel     Status: Abnormal   Collection Time: 01/10/19  1:17 PM  Result Value Ref Range   Sodium 137 135 - 145 mmol/L   Potassium 4.1 3.5 - 5.1 mmol/L   Chloride 106 98 - 111 mmol/L   CO2 20 (L) 22 - 32 mmol/L   Glucose, Bld 100 (H) 70 - 99 mg/dL   BUN 10 6 - 20 mg/dL   Creatinine, Ser 4.090.50 0.44 - 1.00 mg/dL   Calcium 9.2 8.9 - 81.110.3 mg/dL   GFR calc non Af Amer >60 >60 mL/min   GFR calc  Af Amer >60 >60 mL/min   Anion gap 11 5 - 15  CBC     Status: Abnormal   Collection Time: 01/10/19  1:17 PM  Result Value Ref Range   WBC 12.1 (H) 4.0 - 10.5 K/uL   RBC 3.77 (L) 3.87 - 5.11 MIL/uL   Hemoglobin 11.6 (L) 12.0 - 15.0 g/dL   HCT 91.435.0 (L) 78.236.0 - 95.646.0 %   MCV 92.8 80.0 - 100.0 fL   MCH 30.8 26.0 - 34.0 pg   MCHC 33.1 30.0 - 36.0 g/dL   RDW 21.312.1 08.611.5 - 57.815.5 %   Platelets 195 150 - 400 K/uL   nRBC 0.0 0.0 - 0.2 %  hCG, quantitative, pregnancy     Status: Abnormal   Collection Time: 01/10/19  1:17 PM  Result Value Ref Range   hCG, Beta Chain, Quant, S 3,279 (H) <5 mIU/mL  Urinalysis, Complete w Microscopic     Status: Abnormal   Collection Time: 01/10/19  1:17 PM  Result Value Ref Range   Color, Urine YELLOW (A) YELLOW   APPearance HAZY (A) CLEAR   Specific Gravity, Urine 1.017 1.005 - 1.030   pH 6.0 5.0 - 8.0   Glucose, UA NEGATIVE NEGATIVE mg/dL   Hgb urine dipstick NEGATIVE NEGATIVE   Bilirubin Urine NEGATIVE NEGATIVE   Ketones, ur NEGATIVE NEGATIVE mg/dL   Protein, ur NEGATIVE NEGATIVE mg/dL   Nitrite NEGATIVE NEGATIVE   Leukocytes,Ua NEGATIVE NEGATIVE   RBC / HPF 0-5 0 - 5 RBC/hpf   WBC, UA 0-5 0 - 5 WBC/hpf   Bacteria, UA RARE (A) NONE SEEN   Squamous Epithelial / LPF 0-5 0 - 5   Mucus PRESENT   Protein / creatinine ratio, urine     Status: None   Collection Time: 01/10/19  1:17 PM  Result Value Ref Range   Creatinine, Urine 96 mg/dL   Total Protein, Urine 8 mg/dL   Protein Creatinine Ratio 0.08 0.00 - 0.15 mg/mg[Cre]  Hepatic function panel     Status: Abnormal   Collection Time: 01/10/19  1:17 PM  Result Value Ref Range   Total Protein 6.6 6.5 - 8.1 g/dL   Albumin 3.3 (L) 3.5 - 5.0 g/dL   AST 20 15 - 41 U/L   ALT 19 0 - 44 U/L   Alkaline Phosphatase 91 38 - 126 U/L   Total Bilirubin 0.5 0.3 - 1.2 mg/dL   Bilirubin, Direct <4.6<0.1 0.0 - 0.2 mg/dL   Indirect Bilirubin NOT CALCULATED 0.3 - 0.9 mg/dL  Pregnancy, urine POC     Status: Abnormal    Collection Time: 01/10/19  1:24 PM  Result Value Ref Range   Preg Test, Ur POSITIVE (A) NEGATIVE   ____________________________________________ ____________________________________________  RADIOLOGY  I personally reviewed all radiographic images ordered to evaluate for the above acute complaints and reviewed radiology reports and findings.  These findings were personally discussed with the patient.  Please see medical record for radiology report.  ____________________________________________  PROCEDURES  Procedure(s) performed:  Procedures    Critical Care performed: no ____________________________________________   INITIAL IMPRESSION / ASSESSMENT AND PLAN / ED COURSE  Pertinent labs & imaging results that were available during my care of the patient were reviewed by me and considered in my medical decision making (see chart for details).   DDX: Gestational hypertension, preeclampsia, whitecoat hypertension, pregnancy, electrolyte abnormality  Claire Walker is a 23 y.o. who presents to the ED with symptoms as described above.  Patient exhibiting well-appearing mildly elevated blood pressure no respiratory distress or chest discomfort at this time.  Ultrasound ordered to evaluate for pregnancy shows reassuring ultrasound.  She not having any symptoms or contractions or delivery.  Does have elevated blood pressure on arrival but relatively asymptomatic.  Will order blood work.  The patient will be placed on continuous pulse oximetry and telemetry for monitoring.  Laboratory evaluation will be sent to evaluate for the above complaints.     Clinical Course as of Jan 09 1613  Tue Jan 10, 2019  1546 Blood work is reassuring.  Ultrasound reassuring.  Repeat blood pressure is also improving.  Will discuss in consultation with OB/GYN.   [PR]    Clinical Course User Index [PR] Willy Eddy, MD   Discussed case with Dr. Gaynelle Arabian who recommends patient be observed in L&D for  additional blood pressure monitoring and reassessment.  Patient remains well-appearing and stable for transfer to the floor.  The patient was evaluated in Emergency Department today for the symptoms described in the history of present illness. He/she was evaluated in the context of the global COVID-19 pandemic, which necessitated consideration that the patient might be at risk for infection with the SARS-CoV-2 virus that causes COVID-19. Institutional protocols and algorithms that pertain to the evaluation of patients at risk for COVID-19 are in a state of rapid change based on information released by regulatory bodies including the CDC and federal and state organizations. These policies and algorithms were followed during the patient's care in the ED.  As part of my medical decision making, I reviewed the following data within the electronic MEDICAL RECORD NUMBER Nursing notes reviewed and incorporated, Labs reviewed, notes from prior ED visits and [place] Controlled Substance Database   ____________________________________________   FINAL CLINICAL IMPRESSION(S) / ED DIAGNOSES  Final diagnoses:  Hypertension, unspecified type  [redacted] weeks gestation of pregnancy      NEW MEDICATIONS STARTED DURING THIS VISIT:  There are no discharge medications for this patient.    Note:  This document was prepared using Dragon voice recognition software and may include unintentional dictation errors.    Willy Eddy, MD 01/10/19 [phone]

## 2019-01-10 NOTE — ED Notes (Signed)
Report called to L &D   Pt taken vis W/C

## 2019-01-10 NOTE — Progress Notes (Signed)
RN discharged pt and reviewed labor precautions and hypertensive education with pt and all questions answered. Pt verbalized understanding.

## 2019-01-11 ENCOUNTER — Ambulatory Visit (INDEPENDENT_AMBULATORY_CARE_PROVIDER_SITE_OTHER): Payer: Commercial Managed Care - PPO

## 2019-01-11 ENCOUNTER — Telehealth: Payer: Self-pay

## 2019-01-11 ENCOUNTER — Ambulatory Visit (INDEPENDENT_AMBULATORY_CARE_PROVIDER_SITE_OTHER): Payer: Commercial Managed Care - PPO | Admitting: Advanced Practice Midwife

## 2019-01-11 ENCOUNTER — Encounter: Payer: Self-pay | Admitting: Advanced Practice Midwife

## 2019-01-11 VITALS — BP 128/88 | Wt 335.0 lb

## 2019-01-11 DIAGNOSIS — O99213 Obesity complicating pregnancy, third trimester: Secondary | ICD-10-CM

## 2019-01-11 DIAGNOSIS — E669 Obesity, unspecified: Secondary | ICD-10-CM | POA: Insufficient documentation

## 2019-01-11 DIAGNOSIS — Z3A32 32 weeks gestation of pregnancy: Secondary | ICD-10-CM

## 2019-01-11 DIAGNOSIS — Z363 Encounter for antenatal screening for malformations: Secondary | ICD-10-CM | POA: Diagnosis not present

## 2019-01-11 DIAGNOSIS — O99210 Obesity complicating pregnancy, unspecified trimester: Secondary | ICD-10-CM

## 2019-01-11 DIAGNOSIS — O0990 Supervision of high risk pregnancy, unspecified, unspecified trimester: Secondary | ICD-10-CM

## 2019-01-11 DIAGNOSIS — Z3400 Encounter for supervision of normal first pregnancy, unspecified trimester: Secondary | ICD-10-CM

## 2019-01-11 DIAGNOSIS — O0993 Supervision of high risk pregnancy, unspecified, third trimester: Secondary | ICD-10-CM

## 2019-01-11 DIAGNOSIS — Z6841 Body Mass Index (BMI) 40.0 and over, adult: Secondary | ICD-10-CM | POA: Insufficient documentation

## 2019-01-11 HISTORY — DX: Obesity complicating pregnancy, unspecified trimester: O99.210

## 2019-01-11 HISTORY — DX: Supervision of high risk pregnancy, unspecified, unspecified trimester: O09.90

## 2019-01-11 NOTE — Patient Instructions (Addendum)
Perinatal Depression When a woman feels excessive sadness, anger, or anxiety during pregnancy or during the first 12 months after she gives birth, she has a condition called perinatal depression. Depression can interfere with work, school, relationships, and other everyday activities. If it is not managed properly, it can also cause problems in the mother and her baby. Sometimes, perinatal depression is left untreated because symptoms are thought to be normal mood swings during and right after pregnancy. If you have symptoms of depression, it is important to talk with your health care provider. What are the causes? The exact cause of this condition is not known. Hormonal changes during and after pregnancy may play a role in causing perinatal depression. What increases the risk? You are more likely to develop this condition if:  You have a personal or family history of depression, anxiety, or mood disorders.  You experience a stressful life event during pregnancy, such as the death of a loved one.  You have a lot of regular life stress.  You do not have support from family members or loved ones, or you are in an abusive relationship. What are the signs or symptoms? Symptoms of this condition include:  Feeling sad or hopeless.  Feelings of guilt.  Feeling irritable or overwhelmed.  Changes in your appetite.  Lack of energy or motivation.  Sleep problems.  Difficulty concentrating or completing tasks.  Loss of interest in hobbies or relationships.  Headaches or stomach problems that do not go away. How is this diagnosed? This condition is diagnosed based on a physical exam and mental evaluation. In some cases, your health care provider may use a depression screening tool. These tools include a list of questions that can help a health care provider diagnose depression. Your health care provider may refer you to a mental health expert who specializes in depression. How is this  treated? This condition may be treated with:  Medicines. Your health care provider will only give you medicines that have been proven safe for pregnancy and breastfeeding.  Talk therapy with a mental health professional to help change your patterns of thinking (cognitive behavioral therapy).  Support groups.  Brain stimulation or light therapies.  Stress reduction therapies, such as mindfulness. Follow these instructions at home: Lifestyle  Do not use any products that contain nicotine or tobacco, such as cigarettes and e-cigarettes. If you need help quitting, ask your health care provider.  Do not use alcohol when you are pregnant. After your baby is born, limit alcohol intake to no more than 1 drink a day. One drink equals 12 oz of beer, 5 oz of wine, or 1 oz of hard liquor.  Consider joining a support group for new mothers. Ask your health care provider for recommendations.  Take good care of yourself. Make sure you: ? Get plenty of sleep. If you are having trouble sleeping, talk with your health care provider. ? Eat a healthy diet. This includes plenty of fruits and vegetables, whole grains, and lean proteins. ? Exercise regularly, as told by your health care provider. Ask your health care provider what exercises are safe for you. General instructions  Take over-the-counter and prescription medicines only as told by your health care provider.  Talk with your partner or family members about your feelings during pregnancy. Share any concerns or anxieties that you may have.  Ask for help with tasks or chores when you need it. Ask friends and family members to provide meals, watch your children, or help with   cleaning.  Keep all follow-up visits as told by your health care provider. This is important. Contact a health care provider if:  You (or people close to you) notice that you have any symptoms of depression.  You have depression and your symptoms get worse.  You  experience side effects from medicines, such as nausea or sleep problems. Get help right away if:  You feel like hurting yourself, your baby, or someone else. If you ever feel like you may hurt yourself or others, or have thoughts about taking your own life, get help right away. You can go to your nearest emergency department or call:  Your local emergency services (911 in the [place]  A suicide crisis helpline, such as the [facility] at (812)143-3282. This is open 24 hours a day. Summary  Perinatal depression is when a woman feels excessive sadness, anger, or anxiety during pregnancy or during the first 12 months after she gives birth.  If perinatal depression is not treated, it can lead to health problems for the mother and her baby.  This condition is treated with medicines, talk therapy, stress reduction therapies, or a combination of two or more treatments.  Talk with your partner or family members about your feelings. Do not be afraid to ask for help. This information is not intended to replace advice given to you by your health care provider. Make sure you discuss any questions you have with your health care provider. Document Released: 03/04/2016 Document Revised: 01/08/2017 Document Reviewed: 03/04/2016 Elsevier Patient Education  2020 [place] for Pregnant Women While you are pregnant, your body requires additional nutrition to help support your growing baby. You also have a higher need for some vitamins and minerals, such as folic acid, calcium, iron, and vitamin D. Eating a healthy, well-balanced diet is very important for your health and your baby's health. Your need for extra calories varies for the three 34-month segments of your pregnancy (trimesters). For most women, it is recommended to consume:  150 extra calories a day during the first trimester.  300 extra calories a day during the second trimester.  300 extra calories a day  during the third trimester. What are tips for following this plan?   Do not try to lose weight or go on a diet during pregnancy.  Limit your overall intake of foods that have "empty calories." These are foods that have little nutritional value, such as sweets, desserts, candies, and sugar-sweetened beverages.  Eat a variety of foods (especially fruits and vegetables) to get a full range of vitamins and minerals.  Take a prenatal vitamin to help meet your additional vitamin and mineral needs during pregnancy, specifically for folic acid, iron, calcium, and vitamin D.  Remember to stay active. Ask your health care provider what types of exercise and activities are safe for you.  Practice good food safety and cleanliness. Wash your hands before you eat and after you prepare raw meat. Wash all fruits and vegetables well before peeling or eating. Taking these actions can help to prevent food-borne illnesses that can be very dangerous to your baby, such as listeriosis. Ask your health care provider for more information about listeriosis. What does 150 extra calories look like? Healthy options that provide 150 extra calories each day could be any of the following:  6-8 oz (170-230 g) of plain low-fat yogurt with  cup of berries.  1 apple with 2 teaspoons (11 g) of peanut butter.  Cut-up vegetables with  cup (60 g) of hummus.  8 oz (230 mL) or 1 cup of low-fat chocolate milk.  1 stick of string cheese with 1 medium orange.  1 peanut butter and jelly sandwich that is made with one slice of whole-wheat bread and 1 tsp (5 g) of peanut butter. For 300 extra calories, you could eat two of those healthy options each day. What is a healthy amount of weight to gain? The right amount of weight gain for you is based on your BMI before you became pregnant. If your BMI:  Was less than 18 (underweight), you should gain 28-40 lb (13-18 kg).  Was 18-24.9 (normal), you should gain 25-35 lb (11-16  kg).  Was 25-29.9 (overweight), you should gain 15-25 lb (7-11 kg).  Was 30 or greater (obese), you should gain 11-20 lb (5-9 kg). What if I am having twins or multiples? Generally, if you are carrying twins or multiples:  You may need to eat 300-600 extra calories a day.  The recommended range for total weight gain is 25-54 lb (11-25 kg), depending on your BMI before pregnancy.  Talk with your health care provider to find out about nutritional needs, weight gain, and exercise that is right for you. What foods can I eat?  Grains All grains. Choose whole grains, such as whole-wheat bread, oatmeal, or brown rice. Vegetables All vegetables. Eat a variety of colors and types of vegetables. Remember to wash your vegetables well before peeling or eating. Fruits All fruits. Eat a variety of colors and types of fruit. Remember to wash your fruits well before peeling or eating. Meats and other protein foods Lean meats, including chicken, [place], fish, and lean cuts of beef, veal, or pork. If you eat fish or seafood, choose options that are higher in omega-3 fatty acids and lower in mercury, such as salmon, herring, mussels, trout, sardines, pollock, shrimp, crab, and lobster. Tofu. Tempeh. Beans. Eggs. Peanut butter and other nut butters. Make sure that all meats, poultry, and eggs are cooked to food-safe temperatures or "well-done." Two or more servings of fish are recommended each week in order to get the most benefits from omega-3 fatty acids that are found in seafood. Choose fish that are lower in mercury. You can find more information online:  PumpkinSearch.com.ee Dairy Pasteurized milk and milk alternatives (such as almond milk). Pasteurized yogurt and pasteurized cheese. Cottage cheese. Sour cream. Beverages Water. Juices that contain 100% fruit juice or vegetable juice. Caffeine-free teas and decaffeinated coffee. Drinks that contain caffeine are okay to drink, but it is better to avoid caffeine.  Keep your total caffeine intake to less than 200 mg each day (which is 12 oz or 355 mL of coffee, tea, or soda) or the limit as told by your health care provider. Fats and oils Fats and oils are okay to include in moderation. Sweets and desserts Sweets and desserts are okay to include in moderation. Seasoning and other foods All pasteurized condiments. The items listed above may not be a complete list of recommended foods and beverages. Contact your dietitian for more options. The items listed above may not be a complete list of foods and beverages [you/your child] can eat. Contact a dietitian for more information. What foods are not recommended? Vegetables Raw (unpasteurized) vegetable juices. Fruits Unpasteurized fruit juices. Meats and other protein foods Lunch meats, bologna, hot dogs, or other deli meats. (If you must eat those meats, reheat them until they are steaming hot.) Refrigerated pat, meat spreads from a meat  counter, smoked seafood that is found in the refrigerated section of a store. Raw or undercooked meats, poultry, and eggs. Raw fish, such as sushi or sashimi. Fish that have high mercury content, such as tilefish, shark, swordfish, and king mackerel. To learn more about mercury in fish, talk with your health care provider or look for online resources, such as:  PumpkinSearch.com.eewww.fda.gov Dairy Raw (unpasteurized) milk and any foods that have raw milk in them. Soft cheeses, such as feta, queso blanco, queso fresco, Brie, Camembert cheeses, blue-veined cheeses, and Panela cheese (unless it is made with pasteurized milk, which must be stated on the label). Beverages Alcohol. Sugar-sweetened beverages, such as sodas, teas, or energy drinks. Seasoning and other foods Homemade fermented foods and drinks, such as pickles, sauerkraut, or kombucha drinks. (Store-bought pasteurized versions of these are okay.) Salads that are made in a store or deli, such as ham salad, chicken salad, egg salad,  tuna salad, and seafood salad. The items listed above may not be a complete list of foods and beverages to avoid. Contact your dietitian for more information. The items listed above may not be a complete list of foods and beverages [you/your child] should avoid. Contact a dietitian for more information. Where to find more information To calculate the number of calories you need based on your height, weight, and activity level, you can use an online calculator such as:  PackageNews.iswww.choosemyplate.gov/MyPlatePlan To calculate how much weight you should gain during pregnancy, you can use an online pregnancy weight gain calculator such as:  http://jones-berg.com/www.choosemyplate.gov/pregnancy-weight-gain-calculator Summary  While you are pregnant, your body requires additional nutrition to help support your growing baby.  Eat a variety of foods, especially fruits and vegetables to get a full range of vitamins and minerals.  Practice good food safety and cleanliness. Wash your hands before you eat and after you prepare raw meat. Wash all fruits and vegetables well before peeling or eating. Taking these actions can help to prevent food-borne illnesses, such as listeriosis, that can be very dangerous to your baby.  Do not eat raw meat or fish. Do not eat fish that have high mercury content, such as tilefish, shark, swordfish, and king mackerel. Do not eat unpasteurized (raw) dairy.  Take a prenatal vitamin to help meet your additional vitamin and mineral needs during pregnancy, specifically for folic acid, iron, calcium, and vitamin D. This information is not intended to replace advice given to you by your health care provider. Make sure you discuss any questions you have with your health care provider. Document Released: 10/20/2013 Document Revised: 04/28/2018 Document Reviewed: 10/02/2016 Elsevier Patient Education  2020 [employer] Exercise During Pregnancy Exercise is an important part of being healthy for people of all  ages. Exercise improves the function of your heart and lungs and helps you maintain strength, flexibility, and a healthy body weight. Exercise also boosts energy levels and elevates mood. Most women should exercise regularly during pregnancy. In rare cases, women with certain medical conditions or complications may be asked to limit or avoid exercise during pregnancy. How does this affect me? Along with maintaining general strength and flexibility, exercising during pregnancy can help:  Keep strength in muscles that are used during labor and childbirth.  Decrease low back pain.  Reduce symptoms of depression.  Control weight gain during pregnancy.  Reduce the risk of needing insulin if you develop diabetes during pregnancy.  Decrease the risk of cesarean delivery.  Speed up your recovery after giving birth. How does this affect my  baby? Exercise can help you have a healthy pregnancy. Exercise does not cause premature birth. It will not cause your baby to weigh less at birth. What exercises can I do? Many exercises are safe for you to do during pregnancy. Do a variety of exercises that safely increase your heart and breathing rates and help you build and maintain muscle strength. Do exercises exactly as told by your health care provider. You may do these exercises:  Walking or hiking.  Swimming.  Water aerobics.  Riding a stationary bike.  Strength training.  Modified yoga or Pilates. Tell your instructor that you are pregnant. Avoid overstretching, and avoid lying on your back for long periods of time.  Running or jogging. Only choose this type of exercise if you: ? Ran or jogged regularly before your pregnancy. ? Can run or jog and still talk in complete sentences. What exercises should I avoid? Depending on your level of fitness and whether you exercised regularly before your pregnancy, you may be told to limit high-intensity exercise. You can tell that you are exercising at  a high intensity if you are breathing much harder and faster and cannot hold a conversation while exercising. You must avoid:  Contact sports.  Activities that put you at risk for falling on or being hit in the belly, such as downhill skiing, water skiing, surfing, rock climbing, cycling, gymnastics, and horseback riding.  Scuba diving.  Skydiving.  Yoga or Pilates in a room that is heated to high temperatures.  Jogging or running, unless you ran or jogged regularly before your pregnancy. While jogging or running, you should always be able to talk in full sentences. Do not run or jog so fast that you are unable to have a conversation.  Do not exercise at more than 6,000 feet above sea level (high elevation) if you are not used to exercising at high elevation. How do I exercise in a safe way?   Avoid overheating. Do not exercise in very high temperatures.  Wear loose-fitting, breathable clothes.  Avoid dehydration. Drink enough water before, during, and after exercise to keep your urine pale yellow.  Avoid overstretching. Because of hormone changes during pregnancy, it is easy to overstretch muscles, tendons, and ligaments during pregnancy.  Start slowly and ask your health care provider to recommend the types of exercise that are safe for you.  Do not exercise to lose weight. Follow these instructions at home:  Exercise on most days or all days of the week. Try to exercise for 30 minutes a day, 5 days a week, unless your health care provider tells you not to.  If you actively exercised before your pregnancy and you are healthy, your health care provider may tell you to continue to do moderate to high-intensity exercise.  If you are just starting to exercise or did not exercise much before your pregnancy, your health care provider may tell you to do low to moderate-intensity exercise. Questions to ask your health care provider  Is exercise safe for me?  What are signs that I  should stop exercising?  Does my health condition mean that I should not exercise during pregnancy?  When should I avoid exercising during pregnancy? Stop exercising and contact a health care provider if: You have any unusual symptoms, such as:  Mild contractions of the uterus or cramps in the abdomen.  Dizziness that does not go away when you rest. Stop exercising and get help right away if: You have any unusual symptoms, such  as:  Sudden, severe pain in your low back or your belly.  Mild contractions of the uterus or cramps in the abdomen that do not improve with rest and drinking fluids.  Chest pain.  Bleeding or fluid leaking from your vagina.  Shortness of breath. These symptoms may represent a serious problem that is an emergency. Do not wait to see if the symptoms will go away. Get medical help right away. Call your local emergency services (911 in the [place] Do not drive yourself to the hospital. Summary  Most women should exercise regularly throughout pregnancy. In rare cases, women with certain medical conditions or complications may be asked to limit or avoid exercise during pregnancy.  Do not exercise to lose weight during pregnancy.  Your health care provider will tell you what level of physical activity is right for you.  Stop exercising and contact a health care provider if you have mild contractions of the uterus or cramps in the abdomen. Get help right away if these contractions or cramps do not improve with rest and drinking fluids.  Stop exercising and get help right away if you have sudden, severe pain in your low back or belly, chest pain, shortness of breath, or bleeding or leaking of fluid from your vagina. This information is not intended to replace advice given to you by your health care provider. Make sure you discuss any questions you have with your health care provider. Document Released: 01/05/2005 Document Revised: 04/28/2018 Document Reviewed:  02/09/2018 Elsevier Patient Education  2020 [employer]. Prenatal Care Prenatal care is health care during pregnancy. It helps you and your unborn baby (fetus) stay as healthy as possible. Prenatal care may be provided by a midwife, a family practice health care provider, or a childbirth and pregnancy specialist (obstetrician). How does this affect me? During pregnancy, you will be closely monitored for any new conditions that might develop. To lower your risk of pregnancy complications, you and your health care provider will talk about any underlying conditions you have. How does this affect my baby? Early and consistent prenatal care increases the chance that your baby will be healthy during pregnancy. Prenatal care lowers the risk that your baby will be:  Born early (prematurely).  Smaller than expected at birth (small for gestational age). What can I expect at the first prenatal care visit? Your first prenatal care visit will likely be the longest. You should schedule your first prenatal care visit as soon as you know that you are pregnant. Your first visit is a good time to talk about any questions or concerns you have about pregnancy. At your visit, you and your health care provider will talk about:  Your medical history, including: ? Any past pregnancies. ? Your family's medical history. ? The baby's father's medical history. ? Any long-term (chronic) health conditions you have and how you manage them. ? Any surgeries or procedures you have had. ? Any current over-the-counter or prescription medicines, herbs, or supplements you are taking.  Other factors that could pose a risk to your baby, including:  Your home setting and your stress levels, including: ? Exposure to abuse or violence. ? Household financial strain. ? Mental health conditions you have.  Your daily health habits, including diet and exercise. Your health care provider will also:  Measure your weight, height,  and blood pressure.  Do a physical exam, including a pelvic and breast exam.  Perform blood tests and urine tests to check for: ? Urinary tract infection. ?  Sexually transmitted infections (STIs). ? Low iron levels in your blood (anemia). ? Blood type and certain proteins on red blood cells (Rh antibodies). ? Infections and immunity to viruses, such as hepatitis B and rubella. ? HIV (human immunodeficiency virus).  Do an ultrasound to confirm your baby's growth and development and to help predict your estimated due date (EDD). This ultrasound is done with a probe that is inserted into the vagina (transvaginal ultrasound).  Discuss your options for genetic screening.  Give you information about how to keep yourself and your baby healthy, including: ? Nutrition and taking vitamins. ? Physical activity. ? How to manage pregnancy symptoms such as nausea and vomiting (morning sickness). ? Infections and substances that may be harmful to your baby and how to avoid them. ? Food safety. ? Dental care. ? Working. ? Travel. ? Warning signs to watch for and when to call your health care provider. How often will I have prenatal care visits? After your first prenatal care visit, you will have regular visits throughout your pregnancy. The visit schedule is often as follows:  Up to week 28 of pregnancy: once every 4 weeks.  28-36 weeks: once every 2 weeks.  After 36 weeks: every week until delivery. Some women may have visits more or less often depending on any underlying health conditions and the health of the baby. Keep all follow-up and prenatal care visits as told by your health care provider. This is important. What happens during routine prenatal care visits? Your health care provider will:  Measure your weight and blood pressure.  Check for fetal heart sounds.  Measure the height of your uterus in your abdomen (fundal height). This may be measured starting around week 20 of  pregnancy.  Check the position of your baby inside your uterus.  Ask questions about your diet, sleeping patterns, and whether you can feel the baby move.  Review warning signs to watch for and signs of labor.  Ask about any pregnancy symptoms you are having and how you are dealing with them. Symptoms may include: ? Headaches. ? Nausea and vomiting. ? Vaginal discharge. ? Swelling. ? Fatigue. ? Constipation. ? Any discomfort, including back or pelvic pain. Make a list of questions to ask your health care provider at your routine visits. What tests might I have during prenatal care visits? You may have blood, urine, and imaging tests throughout your pregnancy, such as:  Urine tests to check for glucose, protein, or signs of infection.  Glucose tests to check for a form of diabetes that can develop during pregnancy (gestational diabetes mellitus). This is usually done around week 24 of pregnancy.  An ultrasound to check your baby's growth and development and to check for birth defects. This is usually done around week 20 of pregnancy.  A test to check for group B strep (GBS) infection. This is usually done around week 36 of pregnancy.  Genetic testing. This may include blood or imaging tests, such as an ultrasound. Some genetic tests are done during the first trimester and some are done during the second trimester. What else can I expect during prenatal care visits? Your health care provider may recommend getting certain vaccines during pregnancy. These may include:  A yearly flu shot (annual influenza vaccine). This is especially important if you will be pregnant during flu season.  Tdap (tetanus, diphtheria, pertussis) vaccine. Getting this vaccine during pregnancy can protect your baby from whooping cough (pertussis) after birth. This vaccine may be recommended between  weeks 27 and 36 of pregnancy. Later in your pregnancy, your health care provider may give you information  about:  Childbirth and breastfeeding classes.  Choosing a health care provider for your baby.  Umbilical cord banking.  Breastfeeding.  Birth control after your baby is born.  The hospital labor and delivery unit and how to tour it.  Registering at the hospital before you go into labor. Where to find more information  Office on Women's Health: TravelLesson.ca  American Pregnancy Association: americanpregnancy.org  March of Dimes: marchofdimes.org Summary  Prenatal care helps you and your baby stay as healthy as possible during pregnancy.  Your first prenatal care visit will most likely be the longest.  You will have visits and tests throughout your pregnancy to monitor your health and your baby's health.  Bring a list of questions to your visits to ask your health care provider.  Make sure to keep all follow-up and prenatal care visits with your health care provider. This information is not intended to replace advice given to you by your health care provider. Make sure you discuss any questions you have with your health care provider. Document Released: 01/08/2003 Document Revised: 04/27/2018 Document Reviewed: 01/04/2017 Elsevier Patient Education  2020 [employer].    COVID-19 and Your Pregnancy FAQ  How can I prevent infection with COVID-19 during my pregnancy? Social distancing is key. Please limit any interactions in public. Try and work from home if possible. Frequently wash your hands after touching possibly contaminated surfaces. Avoid touching your face.  Minimize trips to the store. Consider online ordering when possible.   Should I wear a mask? YES. It is recommended by the CDC that all people wear a cloth mask or facial covering in public. You should wear a mask to your visits in the office. This will help reduce transmission as well as your risk or acquiring COVID-19. New studies are showing that even asymptomatic individuals can spread the virus from  talking.   Where can I get a mask? [place] and the city of [place] are partnering to provide masks to community members. You can pick up a mask from several locations. This website also has instructions about how to make a mask by sewing or without sewing by using a t-shirt or bandana.  https://www.[place].gov/i-want-to/learn-about/covid-19-information-and-updates/covid-19-face-mask-project  Studies have shown that if you were a tube or nylon stocking from pantyhose over a cloth mask it makes the cloth mask almost as effective as a N95 mask.  [employer].de  What are the symptoms of COVID-19? Fever (greater than 100.4 F), dry cough, shortness of breath.  Am I more at risk for COVID-19 since I am pregnant? There is not currently data showing that pregnant women are more adversely impacted by COVID-19 than the general population. However, we know that pregnant women tend to have worse respiratory complications from similar diseases such as the flu and SARS and for this reason should be considered an at-risk population.  What do I do if I am experiencing the symptoms of COVID-19? Testing is being limited because of test availability. If you are experiencing symptoms you should quarantine yourself, and the members of your family, for at least 2 weeks at home.   Please visit this website for more information: DiscoHelp.si.html  When should I go to the Emergency Room? Please go to the emergency room if you are experiencing ANY of these symptoms*:  1.    Difficulty breathing or shortness of breath 2.    Persistent pain or  pressure in the chest 3.    Confusion or difficulty being aroused (or awakened) 4.    Bluish lips or face  *This list is not all inclusive. Please consult our office for any  other symptoms that are severe or concerning.  What do I do if I am having difficulty breathing? You should go to the Emergency Room for evaluation. At this time they have a tent set up for evaluating patients with COVID-19 symptoms.   How will my prenatal care be different because of the COVID-19 pandemic? It has been recommended to reduce the frequency of face-to-face visits and use resources such as telephone and virtual visits when possible. Using a scale, blood pressure machine and fetal doppler at home can further help reduce face-to-face visits. You will be provided with additional information on this topic.  We ask that you come to your visits alone to minimize potential exposures to  COVID-19.  How can I receive childbirth education? At this time in-person classes have been cancelled. You can register for online childbirth education, breastfeeding, and newborn care classes.  Please visit:  [email or web address] for more information  How will my hospital birth experience be different? The hospital is currently limiting visitors. This means that while you are in labor you can only have one person at the hospital with you. Additional family members will not be allowed to wait in the building or outside your room. Your one support person can be the father of the baby, a relative, a doula, or a friend. Once one support person is designated that person will wear a band. This band cannot be shared with multiple people.  Nitrous Gas is not being offered for pain relief since the tubing and filter for the machine can not be sanitized in a way to guarantee prevention of transmission of COVID-19.  Nasal cannula use of oxygen for fetal indications has also been discontinued.  Currently a clear plastic sheet is being hung between mom and the delivering provider during pushing and delivery to help prevent transmission of COVID-19.      How long will I stay in the hospital for after giving  birth? It is also recommended that discharge home be expedited during the COVID-19 outbreak. This means staying for 1 day after a vaginal delivery and 2 days after a cesarean section. Patients who need to stay longer for medical reasons are allowed to do so, but the goal will be for expedited discharge home.   What if I have COVID-19 and I am in labor? We ask that you wear a mask while on labor and delivery. We will try and accommodate you being placed in a room that is capable of filtering the air. Please call ahead if you are in labor and on your way to the hospital. The phone number for labor and delivery at [facility] is [phone].  If I have COVID-19 when my baby is born how can I prevent my baby from contracting COVID-19? This is an issue that will have to be discussed on a case-by-case basis. Current recommendations suggest providing separate isolation rooms for both the mother and new infant as well as limiting visitors. However, there are practical challenges to this recommendation. The situation will assuredly change and decisions will be influenced by the desires of the mother and availability of space.  Some suggestions are the use of a curtain or physical barrier between mom and infant, hand hygiene, mom wearing a mask, or 6 feet  of spacing between a mom and infant.   Can I breastfeed during the COVID-19 pandemic?   Yes, breastfeeding is encouraged.  Can I breastfeed if I have COVID-19? Yes. Covid-19 has not been found in breast milk. This means you cannot give COVID-19 to your child through breast milk. Breast feeding will also help pass antibodies to fight infection to your baby.   What precautions should I take when breastfeeding if I have COVID-19? If a mother and newborn do room-in and the mother wishes to feed at the breast, she should put on a facemask and practice hand hygiene before each feeding.  What precautions should I take when pumping if I have  COVID-19? Prior to expressing breast milk, mothers should practice hand hygiene. After each pumping session, all parts that come into contact with breast milk should be thoroughly washed and the entire pump should be appropriately disinfected per the manufacturer's instructions. This expressed breast milk should be fed to the newborn by a healthy caregiver.  What if I am pregnant and work in healthcare? Based on limited data regarding COVID-19 and pregnancy, ACOG currently does not propose creating additional restrictions on pregnant health care personnel because of COVID-19 alone. Pregnant women do not appear to be at higher risk of severe disease related to COVID-19. Pregnant health care personnel should follow CDC risk assessment and infection control guidelines for health care personnel exposed to patients with suspected or confirmed COVID-19. Adherence to recommended infection prevention and control practices is an important part of protecting all health care personnel in health care settings.    Information on COVID-19 in pregnancy is very limited; however, facilities may want to consider limiting exposure of pregnant health care personnel to patients with confirmed or suspected COVID-19 infection, especially during higher-risk procedures (eg, aerosol-generating procedures), if feasible, based on staffing availability.

## 2019-01-11 NOTE — Progress Notes (Signed)
No vb. No lof. Ultrasound today

## 2019-01-11 NOTE — Telephone Encounter (Signed)
Pt called the after hour nurse 01/10/19 8:07pm; 32w 4d preg; seen in ED for UTI; was given macrobid.  Pt concerned; info states macrobid may cause birth defects.  Pt was adv okay to take.  Pt has appt today in office.

## 2019-01-11 NOTE — Progress Notes (Signed)
  Routine Prenatal Care Visit  Subjective  Claire Walker is a 23 y.o. G1P0000 at [redacted]w[redacted]d being seen today for ongoing prenatal care.  She is currently monitored for the following issues for this high-risk pregnancy and has Allergic rhinitis; Depression, major, single episode, severe ([place]); Obesity (BMI 35.0-39.9 without comorbidity); Supervision of high risk pregnancy, antepartum; BMI 45.0-49.9, adult ([place]); and Obesity affecting pregnancy on their problem list.  ----------------------------------------------------------------------------------- Patient reports doing well since she took the day off from work. She has been adjusting to news of the pregnancy over the past 2 weeks. We discussed anatomy scan results and will do follow up in 2 weeks to see gender/ACI.    Contractions: Not present. Vag. Bleeding: None.  Movement: Present. Leaking Fluid denies.  ----------------------------------------------------------------------------------- The following portions of the patient's history were reviewed and updated as appropriate: allergies, current medications, past family history, past medical history, past social history, past surgical history and problem list. Problem list updated.  Objective  Blood pressure 128/88, weight (!) 335 lb (152 kg), last menstrual period 05/05/2018. Pregravid weight 310 lb (140.6 kg) Total Weight Gain 25 lb (11.3 kg) Urinalysis: Urine Protein    Urine Glucose    Fetal Status: Fetal Heart Rate (bpm): 145   Movement: Present  Presentation: Vertex   Anatomy scan: incomplete for gender and abdominal cord insertion and otherwise normal, AFI 17.2, 4#2oz  General:  Alert, oriented and cooperative. Patient is in no acute distress.  Skin: Skin is warm and dry. No rash noted.   Cardiovascular: Normal heart rate noted  Respiratory: Normal respiratory effort, no problems with respiration noted  Abdomen: Soft, gravid, appropriate for gestational age. Pain/Pressure: Present      Pelvic:  Cervical exam deferred        Extremities: Normal range of motion.  Edema: None  Mental Status: Normal mood and affect. Normal behavior. Normal judgment and thought content.   Assessment   23 y.o. G1P0000 at [redacted]w[redacted]d by  03/03/2019, by Ultrasound presenting for routine prenatal visit  Plan   Pregnancy#1 Problems (from 05/05/18 to present)    Problem Noted Resolved   Supervision of high risk pregnancy, antepartum 01/11/2019 by Rod Can, CNM No   BMI 45.0-49.9, adult ([place]) 01/11/2019 by Rod Can, CNM No   Obesity affecting pregnancy 01/11/2019 by Rod Can, CNM No       Preterm labor symptoms and general obstetric precautions including but not limited to vaginal bleeding, contractions, leaking of fluid and fetal movement were reviewed in detail with the patient. Please refer to After Visit Summary for other counseling recommendations.   Return in about 2 weeks (around 01/25/2019) for follow up anatomy scan and rob.  Rod Can, CNM 01/11/2019 4:32 PM

## 2019-01-20 NOTE — L&D Delivery Note (Signed)
Obstetrical Delivery Note   Date of Delivery:   02/26/2019 Primary OB:   [facility] Gestational Age/EDD: [redacted]w[redacted]d (Dated by 57 wk4d ultrasound) Antepartum complications: gestational hypertension, morbid obesity  Delivered By:   Farrel Conners, CNM  Delivery Type:   spontaneous vaginal delivery  Procedure Details:   CTSP with urge to push. Mother pushed to deliver a vigorous female infant in [place] . Good cry. Baby placed on mother's abdomen and dried. After a delayed cord clamping, baby's cord was cut by the father of the baby. Spontaneous delivery of intact placenta and 3 vessel cord. Repair of first degree lacerations of vagina, right and left labia under epidural and local and topical anesthesia. EBL250 ml Anesthesia:    local, epidural and topical Intrapartum complications: none GBS:    negative Laceration:    First degree laceration of vagina at introitus, and superficial right and left labial lacerations Episiotomy:    none Placenta:    Via active 3rd stage. To pathology: no Estimated Blood Loss:  250 ml Baby:    Liveborn female, Apgars 9/10, weight pending    Farrel Conners, CNM

## 2019-01-25 ENCOUNTER — Ambulatory Visit (INDEPENDENT_AMBULATORY_CARE_PROVIDER_SITE_OTHER): Payer: Commercial Managed Care - PPO | Admitting: Certified Nurse Midwife

## 2019-01-25 ENCOUNTER — Other Ambulatory Visit: Payer: Self-pay

## 2019-01-25 ENCOUNTER — Ambulatory Visit (INDEPENDENT_AMBULATORY_CARE_PROVIDER_SITE_OTHER): Payer: Commercial Managed Care - PPO

## 2019-01-25 VITALS — BP 130/84 | Wt 346.0 lb

## 2019-01-25 DIAGNOSIS — O0990 Supervision of high risk pregnancy, unspecified, unspecified trimester: Secondary | ICD-10-CM

## 2019-01-25 DIAGNOSIS — Z362 Encounter for other antenatal screening follow-up: Secondary | ICD-10-CM

## 2019-01-25 DIAGNOSIS — Z3A34 34 weeks gestation of pregnancy: Secondary | ICD-10-CM

## 2019-01-25 DIAGNOSIS — O2343 Unspecified infection of urinary tract in pregnancy, third trimester: Secondary | ICD-10-CM

## 2019-01-25 DIAGNOSIS — O99213 Obesity complicating pregnancy, third trimester: Secondary | ICD-10-CM

## 2019-01-25 DIAGNOSIS — Z23 Encounter for immunization: Secondary | ICD-10-CM | POA: Diagnosis not present

## 2019-01-25 DIAGNOSIS — O99210 Obesity complicating pregnancy, unspecified trimester: Secondary | ICD-10-CM

## 2019-01-25 DIAGNOSIS — O0993 Supervision of high risk pregnancy, unspecified, third trimester: Secondary | ICD-10-CM

## 2019-01-25 DIAGNOSIS — Z6841 Body Mass Index (BMI) 40.0 and over, adult: Secondary | ICD-10-CM

## 2019-01-25 MED ORDER — PRENATE MINI 29-0.6-0.4-350 MG PO CAPS: 1.0000 | ORAL_CAPSULE | Freq: Every day | ORAL | 11 refills | Status: DC

## 2019-01-25 NOTE — Progress Notes (Signed)
U/s today. No vb. No lof.  

## 2019-01-26 LAB — INHERITEST CORE(CF97,SMA,FRAX)

## 2019-01-26 LAB — COMPREHENSIVE METABOLIC PANEL
ALT: 15 IU/L (ref 0–32)
AST: 15 IU/L (ref 0–40)
Albumin/Globulin Ratio: 1.7 (ref 1.2–2.2)
Albumin: 3.9 g/dL (ref 3.9–5.0)
Alkaline Phosphatase: 101 IU/L (ref 39–117)
BUN/Creatinine Ratio: 15 (ref 9–23)
BUN: 9 mg/dL (ref 6–20)
Bilirubin Total: <0.2 mg/dL (ref 0.0–1.2)
CO2: 20 mmol/L (ref 20–29)
Calcium: 9.2 mg/dL (ref 8.7–10.2)
Chloride: 105 mmol/L (ref 96–106)
Creatinine, Ser: 0.61 mg/dL (ref 0.57–1.00)
GFR calc Af Amer: 147 mL/min/{1.73_m2} (ref >59)
GFR calc non Af Amer: 127 mL/min/{1.73_m2} (ref >59)
Globulin, Total: 2.3 g/dL (ref 1.5–4.5)
Glucose: 97 mg/dL (ref 65–99)
Potassium: 3.8 mmol/L (ref 3.5–5.2)
Sodium: 138 mmol/L (ref 134–144)
Total Protein: 6.2 g/dL (ref 6.0–8.5)

## 2019-01-26 LAB — RPR+RH+ABO+RUB AB+AB SCR+CB...
Antibody Screen: NEGATIVE
HIV Screen 4th Generation wRfx: NONREACTIVE
Hematocrit: 34.1 % (ref 34.0–46.6)
Hemoglobin: 12 g/dL (ref 11.1–15.9)
Hepatitis B Surface Ag: NEGATIVE
MCH: 31.8 pg (ref 26.6–33.0)
MCHC: 35.2 g/dL (ref 31.5–35.7)
MCV: 91 fL (ref 79–97)
Platelets: 202 10*3/uL (ref 150–450)
RBC: 3.77 x10E6/uL (ref 3.77–5.28)
RDW: 12 % (ref 11.7–15.4)
RPR Ser Ql: NONREACTIVE
Rh Factor: POSITIVE
Rubella Antibodies, IGG: 2.16 index (ref >0.99)
Varicella zoster IgG: 1719 index (ref >165)
WBC: 12.1 10*3/uL — ABNORMAL HIGH (ref 3.4–10.8)

## 2019-01-26 LAB — HEMOGLOBIN A1C
Est. average glucose Bld gHb Est-mCnc: 100 mg/dL
Hgb A1c MFr Bld: 5.1 % (ref 4.8–5.6)

## 2019-01-28 LAB — URINE CULTURE

## 2019-01-28 NOTE — Progress Notes (Signed)
HROB at 34wk5d: Baby moving well. Desires refill of Prenate Mini vitamins. Follow up anatomy scan today-complete, female gender, anterior placenta 11# weight gain in 2 weeks, weight now 346# BP 130/84 FH 34cm and FHT 144 NOB labs reviewed: O Positive, RI, VI, CMP normal, plt normal, hemoglobin A1C is 5.1% Needs anesthesia consult Labor precautions/ Preregistration TDAP today and BT consent signed ROB in 1-2 weeks RX for Prenate Mini and samples Urine culture for [employer], Set, Baby info given  Farrel Conners, CNM

## 2019-01-30 ENCOUNTER — Telehealth: Payer: Self-pay | Admitting: Certified Nurse Midwife

## 2019-01-30 NOTE — Addendum Note (Signed)
Addended by: Farrel Conners on: 01/30/2019 05:46 PM   Modules accepted: Orders

## 2019-01-30 NOTE — Telephone Encounter (Signed)
done

## 2019-01-30 NOTE — Telephone Encounter (Signed)
Patient is schedule for ultrasound 02/09/19 for AFI . Please please order

## 2019-02-09 ENCOUNTER — Ambulatory Visit (INDEPENDENT_AMBULATORY_CARE_PROVIDER_SITE_OTHER): Payer: Commercial Managed Care - PPO

## 2019-02-09 ENCOUNTER — Other Ambulatory Visit (HOSPITAL_COMMUNITY)
Admission: RE | Admit: 2019-02-09 | Discharge: 2019-02-09 | Disposition: A | Discharge status: Home / Self Care | Payer: Commercial Managed Care - PPO | Source: Ambulatory Visit | Attending: Obstetrics and Gynecology | Admitting: Obstetrics and Gynecology

## 2019-02-09 ENCOUNTER — Other Ambulatory Visit: Payer: Self-pay

## 2019-02-09 ENCOUNTER — Ambulatory Visit (INDEPENDENT_AMBULATORY_CARE_PROVIDER_SITE_OTHER): Payer: Commercial Managed Care - PPO | Admitting: Obstetrics and Gynecology

## 2019-02-09 VITALS — BP 122/80 | Wt 350.0 lb

## 2019-02-09 DIAGNOSIS — O0990 Supervision of high risk pregnancy, unspecified, unspecified trimester: Secondary | ICD-10-CM | POA: Diagnosis not present

## 2019-02-09 DIAGNOSIS — Z3A36 36 weeks gestation of pregnancy: Secondary | ICD-10-CM | POA: Diagnosis not present

## 2019-02-09 DIAGNOSIS — O99213 Obesity complicating pregnancy, third trimester: Secondary | ICD-10-CM | POA: Diagnosis not present

## 2019-02-09 DIAGNOSIS — O99210 Obesity complicating pregnancy, unspecified trimester: Secondary | ICD-10-CM

## 2019-02-09 DIAGNOSIS — Z6841 Body Mass Index (BMI) 40.0 and over, adult: Secondary | ICD-10-CM

## 2019-02-09 LAB — OB RESULTS CONSOLE GC/CHLAMYDIA: Gonorrhea: NEGATIVE

## 2019-02-09 NOTE — Progress Notes (Signed)
Routine Prenatal Care Visit  Subjective  Claire Walker is a 24 y.o. G1P0000 at [redacted]w[redacted]d being seen today for ongoing prenatal care.  She is currently monitored for the following issues for this low-risk pregnancy and has Allergic rhinitis; Depression, major, single episode, severe ([facility]); Obesity (BMI 35.0-39.9 without comorbidity); Supervision of high risk pregnancy, antepartum; BMI 45.0-49.9, adult ([facility]); and Obesity affecting pregnancy on their problem list.  ----------------------------------------------------------------------------------- Patient reports no complaints.   Contractions: Not present. Vag. Bleeding: None.  Movement: Present. Denies leaking of fluid.  ----------------------------------------------------------------------------------- The following portions of the patient's history were reviewed and updated as appropriate: allergies, current medications, past family history, past medical history, past social history, past surgical history and problem list. Problem list updated.   Objective  Blood pressure 122/80, weight (!) 350 lb (158.8 kg), last menstrual period 05/05/2018. Pregravid weight 310 lb (140.6 kg) Total Weight Gain 40 lb (18.1 kg) Urinalysis:      Fetal Status:     Movement: Present     General:  Alert, oriented and cooperative. Patient is in no acute distress.  Skin: Skin is warm and dry. No rash noted.   Cardiovascular: Normal heart rate noted  Respiratory: Normal respiratory effort, no problems with respiration noted  Abdomen: Soft, gravid, appropriate for gestational age. Pain/Pressure: Present     Pelvic:  Cervical exam deferred        Extremities: Normal range of motion.     Mental Status: Normal mood and affect. Normal behavior. Normal judgment and thought content.     Assessment   24 y.o. G1P0000 at [redacted]w[redacted]d by  03/03/2019, by Ultrasound presenting for routine prenatal visit  Plan   Pregnancy#1 Problems (from 05/05/18 to present)    Problem Noted  Resolved   Supervision of high risk pregnancy, antepartum 01/11/2019 by Tresea Mall, CNM No   Overview Signed 01/28/2019  3:09 PM by Farrel Conners, CNM    Clinic [facility] Prenatal Labs  Dating 32 week ultrasoud Blood type: O/Positive/-- (12/16 1532)   Genetic Screen 1 Screen:    AFP:     Quad:     NIPS: Antibody:Negative (12/16 1532)  Anatomic [place] Normal, anterior placenta Rubella: 2.16 (12/16 1532) Varicella:Immune  GTT Early:               Third trimester: Hemoglobin A1C 5.1% RPR: Non Reactive (12/16 1532)   Rhogam  HBsAg: Negative (12/16 1532)   TDaP vaccine      01/25/19                 Flu Shot: HIV: Non Reactive (12/16 1532)   Baby Food      Breast                          GBS:   Contraception  Pap:  CBB     CS/VBAC    Support Person           BMI 45.0-49.9, adult ([facility]) 01/11/2019 by Tresea Mall, CNM No   Obesity affecting pregnancy 01/11/2019 by Tresea Mall, CNM No       Gestational age appropriate obstetric precautions including but not limited to vaginal bleeding, contractions, leaking of fluid and fetal movement were reviewed in detail with the patient.    NST: 150 bpm baseline, moderate variability, 15x15 accelerations, no decelerations.  Return in about 1 week (around 02/16/2019) for ROB/NST/ AFI.  Natale Milch MD [facility], [facility] 02/09/2019, 4:33  PM

## 2019-02-10 LAB — OB RESULTS CONSOLE GBS: GBS: NEGATIVE

## 2019-02-13 LAB — CERVICOVAGINAL ANCILLARY ONLY
Chlamydia: NEGATIVE
Comment: NEGATIVE
Comment: NORMAL
Neisseria Gonorrhea: NEGATIVE

## 2019-02-14 ENCOUNTER — Other Ambulatory Visit: Payer: Self-pay

## 2019-02-14 ENCOUNTER — Encounter: Payer: Self-pay | Admitting: Obstetrics and Gynecology

## 2019-02-14 ENCOUNTER — Inpatient Hospital Stay
Admission: EM | Admit: 2019-02-14 | Discharge: 2019-02-15 | Disposition: A | Discharge status: Home / Self Care | Payer: Commercial Managed Care - PPO | Attending: Obstetrics and Gynecology | Admitting: Obstetrics and Gynecology

## 2019-02-14 ENCOUNTER — Telehealth: Payer: Self-pay | Admitting: Obstetrics & Gynecology

## 2019-02-14 DIAGNOSIS — R519 Headache, unspecified: Secondary | ICD-10-CM | POA: Insufficient documentation

## 2019-02-14 DIAGNOSIS — E669 Obesity, unspecified: Secondary | ICD-10-CM | POA: Insufficient documentation

## 2019-02-14 DIAGNOSIS — O99210 Obesity complicating pregnancy, unspecified trimester: Secondary | ICD-10-CM

## 2019-02-14 DIAGNOSIS — O0990 Supervision of high risk pregnancy, unspecified, unspecified trimester: Secondary | ICD-10-CM

## 2019-02-14 DIAGNOSIS — Z3A37 37 weeks gestation of pregnancy: Secondary | ICD-10-CM | POA: Diagnosis not present

## 2019-02-14 DIAGNOSIS — O26893 Other specified pregnancy related conditions, third trimester: Secondary | ICD-10-CM | POA: Insufficient documentation

## 2019-02-14 DIAGNOSIS — Z3685 Encounter for antenatal screening for Streptococcus B: Secondary | ICD-10-CM | POA: Insufficient documentation

## 2019-02-14 DIAGNOSIS — O99213 Obesity complicating pregnancy, third trimester: Secondary | ICD-10-CM | POA: Insufficient documentation

## 2019-02-14 DIAGNOSIS — Z6841 Body Mass Index (BMI) 40.0 and over, adult: Secondary | ICD-10-CM

## 2019-02-14 DIAGNOSIS — R030 Elevated blood-pressure reading, without diagnosis of hypertension: Secondary | ICD-10-CM | POA: Diagnosis not present

## 2019-02-14 LAB — COMPREHENSIVE METABOLIC PANEL
ALT: 20 U/L (ref 0–44)
AST: 22 U/L (ref 15–41)
Albumin: 3 g/dL — ABNORMAL LOW (ref 3.5–5.0)
Alkaline Phosphatase: 132 U/L — ABNORMAL HIGH (ref 38–126)
Anion gap: 5 (ref 5–15)
BUN: 10 mg/dL (ref 6–20)
CO2: 25 mmol/L (ref 22–32)
Calcium: 8.9 mg/dL (ref 8.9–10.3)
Chloride: 108 mmol/L (ref 98–111)
Creatinine, Ser: 0.64 mg/dL (ref 0.44–1.00)
GFR calc Af Amer: >60 mL/min (ref >60)
GFR calc non Af Amer: >60 mL/min (ref >60)
Glucose, Bld: 89 mg/dL (ref 70–99)
Potassium: 3.8 mmol/L (ref 3.5–5.1)
Sodium: 138 mmol/L (ref 135–145)
Total Bilirubin: 0.2 mg/dL — ABNORMAL LOW (ref 0.3–1.2)
Total Protein: 6.2 g/dL — ABNORMAL LOW (ref 6.5–8.1)

## 2019-02-14 LAB — PROTEIN / CREATININE RATIO, URINE
Creatinine, Urine: 153 mg/dL
Protein Creatinine Ratio: 0.08 mg/mg{Cre} (ref 0.00–0.15)
Total Protein, Urine: 13 mg/dL

## 2019-02-14 LAB — CBC
HCT: 32.9 % — ABNORMAL LOW (ref 36.0–46.0)
Hemoglobin: 11 g/dL — ABNORMAL LOW (ref 12.0–15.0)
MCH: 30.7 pg (ref 26.0–34.0)
MCHC: 33.4 g/dL (ref 30.0–36.0)
MCV: 91.9 fL (ref 80.0–100.0)
Platelets: 192 10*3/uL (ref 150–400)
RBC: 3.58 MIL/uL — ABNORMAL LOW (ref 3.87–5.11)
RDW: 12.4 % (ref 11.5–15.5)
WBC: 11.3 10*3/uL — ABNORMAL HIGH (ref 4.0–10.5)
nRBC: 0 % (ref 0.0–0.2)

## 2019-02-14 LAB — CULTURE, BETA STREP (GROUP B ONLY): Strep Gp B Culture: NEGATIVE

## 2019-02-14 MED ORDER — OXYCODONE-ACETAMINOPHEN 5-325 MG PO TABS
2.0000 | ORAL_TABLET | ORAL | Status: AC
  Administered 2019-02-14: 22:00:00 2 via ORAL
  Filled 2019-02-14: qty 2

## 2019-02-14 MED ORDER — ACETAMINOPHEN 500 MG PO TABS
ORAL_TABLET | ORAL | Status: AC
  Filled 2019-02-14: qty 2

## 2019-02-14 MED ORDER — DIPHENHYDRAMINE HCL 25 MG PO CAPS
50.0000 mg | ORAL_CAPSULE | ORAL | Status: AC
  Administered 2019-02-14: 19:00:00 50 mg via ORAL
  Filled 2019-02-14: qty 2

## 2019-02-14 MED ORDER — ACETAMINOPHEN 500 MG PO TABS
1000.0000 mg | ORAL_TABLET | Freq: Four times a day (QID) | ORAL | Status: DC | PRN
  Administered 2019-02-14: 17:00:00 1000 mg via ORAL

## 2019-02-14 MED ORDER — PROCHLORPERAZINE MALEATE 10 MG PO TABS
10.0000 mg | ORAL_TABLET | ORAL | Status: AC
  Administered 2019-02-14: 10 mg via ORAL
  Filled 2019-02-14: qty 1

## 2019-02-14 NOTE — Telephone Encounter (Signed)
Pts mom came in (she is on DPR) concerned because her daughters blood pressure has been really high and would like someone to contact her daughter to explain the seriousness of high blood pressure during pregnancy.

## 2019-02-14 NOTE — Telephone Encounter (Signed)
Pt has appt tomorrow with [facility].

## 2019-02-14 NOTE — Telephone Encounter (Signed)
Patient advised per note left by [facility]. Patient is heading to hospital now

## 2019-02-14 NOTE — Telephone Encounter (Signed)
Tried to call pt twice, no answer. Pt needs to go to hospital to be monitored since we do not have any openings. Please let me know if pt calls back.

## 2019-02-14 NOTE — H&P (Signed)
OB History & Physical   History of Present Illness:  Chief Complaint: headache and elevated blood pressure  HPI:  Claire Walker is a 24 y.o. G1P0000 female at [redacted]w[redacted]d dated by a 32 week ultrsound.  Her pregnancy has been complicated by late entry into prenatal care (32 weeks), UTI upon entry into prenatal care, obesity.    She denies contractions.   She denies leakage of fluid.   She denies vaginal bleeding.   She reports fetal movement.    She notes that at work today she wasn't feeling quite right. She had a headache and so at work she took her blood pressure, which was > 140/90s.  So, she presented to L&D for further evaluation.  She denies visual changes and RUQ pain.   She notes a history of headaches and reports that her current headache is somewhat similar to her prior headaches.   Total weight gain for pregnancy: 16.8 kg   Obstetrical Problem List: Pregnancy#1 Problems (from 05/05/18 to present)    Problem Noted Resolved   Supervision of high risk pregnancy, antepartum 01/11/2019 by Tresea Mall, CNM No   Overview Signed 01/28/2019  3:09 PM by Farrel Conners, CNM    Clinic [facility] Prenatal Labs  Dating 32 week ultrasoud Blood type: O/Positive/-- (12/16 1532)   Genetic Screen 1 Screen:    AFP:     Quad:     NIPS: Antibody:Negative (12/16 1532)  Anatomic [place] Normal, anterior placenta Rubella: 2.16 (12/16 1532) Varicella:Immune  GTT Early:               Third trimester: Hemoglobin A1C 5.1% RPR: Non Reactive (12/16 1532)   Rhogam  HBsAg: Negative (12/16 1532)   TDaP vaccine      01/25/19                 Flu Shot: HIV: Non Reactive (12/16 1532)   Baby Food      Breast                          GBS:   Contraception  Pap:  CBB     CS/VBAC    Support Person           BMI 45.0-49.9, adult (HCC) 01/11/2019 by Tresea Mall, CNM No   Obesity affecting pregnancy 01/11/2019 by Tresea Mall, CNM No       Maternal Medical History:   Past Medical History:  Diagnosis Date  . No  known health problems     Past Surgical History:  Procedure Laterality Date  . NO PAST SURGERIES      No Known Allergies  Prior to Admission medications   Medication Sig Start Date End Date Taking? Authorizing Provider  Prenat w/o A-FeCbn-Meth-FA-DHA (PRENATE MINI) 29-0.6-0.4-350 MG CAPS Take 1 capsule by mouth daily. 01/25/19  Yes Farrel Conners, CNM    OB History  Gravida Para Term Preterm AB Living  1 0 0        SAB TAB Ectopic Multiple Live Births               # Outcome Date GA Lbr Len/2nd Weight Sex Delivery Anes PTL Lv  1 Current             Prenatal care site: [facility]  Social History: She  reports that she has never smoked. She has never used smokeless tobacco. She reports that she does not drink alcohol or use drugs.  Family History: family history  includes Arthritis in her mother; Asthma in her mother; Cancer in her maternal grandfather; Diabetes in her father and maternal grandfather; Hypertension in her maternal grandfather, maternal grandmother, mother, paternal grandfather, and paternal grandmother; Thyroid disease in her mother.   Review of Systems:  Review of Systems  Constitutional: Negative.   HENT: Negative.   Eyes: Negative.  Negative for blurred vision and double vision.  Respiratory: Negative.   Cardiovascular: Negative.   Gastrointestinal: Negative.  Negative for abdominal pain (no RUQ pain).  Genitourinary: Negative.   Musculoskeletal: Negative.   Skin: Negative.   Neurological: Positive for headaches. Negative for dizziness, tingling, tremors, sensory change, speech change, focal weakness, seizures, loss of consciousness and weakness.  Psychiatric/Behavioral: Negative.      Physical Exam:  BP (!) 104/48   Pulse 75   Temp 98.4 F (36.9 C) (Oral)   Resp 18   Ht 6' (1.829 m)   Wt (!) 157.4 kg   LMP 05/05/2018 (LMP Unknown)   SpO2 100%   BMI 47.06 kg/m   BP range: initially elevated in the >140 SBP range.  They have trended down  into the normal range at this point. Physical Exam Constitutional:      General: She is not in acute distress.    Appearance: Normal appearance. She is well-developed.  HENT:     Head: Normocephalic and atraumatic.  Eyes:     General: No scleral icterus.    Conjunctiva/sclera: Conjunctivae normal.  Cardiovascular:     Rate and Rhythm: Normal rate and regular rhythm.     Heart sounds: No murmur. No friction rub. No gallop.   Pulmonary:     Effort: Pulmonary effort is normal. No respiratory distress.     Breath sounds: Normal breath sounds. No wheezing or rales.  Abdominal:     General: Bowel sounds are normal. There is no distension.     Palpations: Abdomen is soft.     Comments: Gravid, NT  Musculoskeletal:        General: Normal range of motion.     Cervical back: Normal range of motion and neck supple.  Neurological:     General: No focal deficit present.     Mental Status: She is alert and oriented to person, place, and time.     Cranial Nerves: No cranial nerve deficit.  Skin:    General: Skin is warm and dry.     Findings: No erythema.  Psychiatric:        Mood and Affect: Mood normal.        Behavior: Behavior normal.        Judgment: Judgment normal.      Pertinent Results:   Lab Results  Component Value Date   WBC 11.3 (H) 02/14/2019   HGB 11.0 (L) 02/14/2019   HCT 32.9 (L) 02/14/2019   PLT 192 02/14/2019   CREATININE 0.64 02/14/2019   ALT 20 02/14/2019   AST 22 02/14/2019   PROTCRRATIO 0.08 02/14/2019    NST: Baseline FHR: 130 beats/min   Variability: moderate   Accelerations: present   Decelerations: absent Contractions: absent frequency: n/a Overall assessment: cat 1   Lab Results  Component Value Date   SARSCOV2NAA Not Detected 08/05/2018    Assessment:  Claire Walker is a 24 y.o. G1P0000 female at [redacted]w[redacted]d with elevated blood pressure and headache, though BPs are improved and her headache continues.   Plan:  1. Admit to Labor & Delivery   2. CBC, T&S, Clrs, IVF 3. GBS  negative.   4. Fetwal well-being: reassuring 5. Continue to monitor BPs. If she has any other BP elevations, will admit for IOL. She has not had elevated BPs > 4 hours apart, per ACOG recommendation for diagnosis of gestational hypertension/preeclampsia.  The etiology of her headache is unclear given that her blood pressure is greatly improved and she still has a moderate headache. She has failed tylenol and combination of compazine/benadryl. Will try percocet next to try to alleviate her headache.     Thomasene Mohair, MD 02/14/2019 10:06 PM

## 2019-02-15 ENCOUNTER — Encounter: Payer: Self-pay | Admitting: Obstetrics & Gynecology

## 2019-02-15 ENCOUNTER — Ambulatory Visit (INDEPENDENT_AMBULATORY_CARE_PROVIDER_SITE_OTHER): Payer: Commercial Managed Care - PPO | Admitting: Obstetrics & Gynecology

## 2019-02-15 ENCOUNTER — Ambulatory Visit (INDEPENDENT_AMBULATORY_CARE_PROVIDER_SITE_OTHER): Payer: Commercial Managed Care - PPO

## 2019-02-15 VITALS — BP 120/80 | Wt 353.0 lb

## 2019-02-15 DIAGNOSIS — O0990 Supervision of high risk pregnancy, unspecified, unspecified trimester: Secondary | ICD-10-CM

## 2019-02-15 DIAGNOSIS — Z3A37 37 weeks gestation of pregnancy: Secondary | ICD-10-CM

## 2019-02-15 DIAGNOSIS — O0993 Supervision of high risk pregnancy, unspecified, third trimester: Secondary | ICD-10-CM

## 2019-02-15 DIAGNOSIS — O99213 Obesity complicating pregnancy, third trimester: Secondary | ICD-10-CM | POA: Diagnosis not present

## 2019-02-15 DIAGNOSIS — Z6841 Body Mass Index (BMI) 40.0 and over, adult: Secondary | ICD-10-CM

## 2019-02-15 LAB — POCT URINALYSIS DIPSTICK OB
Glucose, UA: NEGATIVE
POC,PROTEIN,UA: NEGATIVE

## 2019-02-15 MED ORDER — ACETAMINOPHEN 500 MG PO TABS: 1000.0000 mg | ORAL_TABLET | Freq: Four times a day (QID) | ORAL | 0 refills | Status: DC | PRN

## 2019-02-15 NOTE — Discharge Summary (Signed)
Physician Discharge Summary  Patient ID: Claire Walker MRN: [id number] DOB/AGE: 1995/12/04 24 y.o.  Admit date: 02/14/2019 Admitting provider: Conard Novak, MD Discharge date: 02/15/2019   Admission Diagnoses:  1) intrauterine pregnancy at [redacted]w[redacted]d  2) elevated blood pressure  3) headache  Discharge Diagnoses:  1) intrauterine pregnancy at [redacted]w[redacted]d  2) elevated blood pressure - resolved 3) headache - improved  History of Present Illness: The patient is a 24 y.o. female G1P0000 at [redacted]w[redacted]d who presents for elevated blood pressure at work today. She states that she has had a headache for a couple of days now.  At work today she began to feel like not quite herself. She went to the health clinic at work and checked her blood pressure and found it to be elevated.  When she presented her headache was an 8/10.  She denied visual changes and RUQ pain. She noted +FM, no LOF, no VB, and no contractions.  Her pregnancy is complicated by late entry to care and obesity. However, since she entered into care she has been meticulous in her keeping appointments.   Past Medical History:  Diagnosis Date  . No known health problems     Past Surgical History:  Procedure Laterality Date  . NO PAST SURGERIES      No current facility-administered medications on file prior to encounter.   Current Outpatient Medications on File Prior to Encounter  Medication Sig Dispense Refill  . Prenat w/o A-FeCbn-Meth-FA-DHA (PRENATE MINI) 29-0.6-0.4-350 MG CAPS Take 1 capsule by mouth daily. 30 capsule 11   Allergies: No Known Allergies  Social History   Socioeconomic History  . Marital status: Single    Spouse name: Not on file  . Number of children: Not on file  . Years of education: Not on file  . Highest education level: Not on file  Occupational History  . Occupation: [employer]  Tobacco Use  . Smoking status: Never Smoker  . Smokeless tobacco: Never Used  Substance and Sexual Activity  . Alcohol use: No     Alcohol/week: 0.0 standard drinks  . Drug use: No  . Sexual activity: Yes    Birth control/protection: Pill    Comment: Junel OCP  Other Topics Concern  . Not on file  Social History Narrative  . Not on file   Social Determinants of Health   Financial Resource Strain:   . Difficulty of Paying Living Expenses: Not on file  Food Insecurity:   . Worried About Programme researcher, broadcasting/film/video in the Last Year: Not on file  . Ran Out of Food in the Last Year: Not on file  Transportation Needs:   . Lack of Transportation (Medical): Not on file  . Lack of Transportation (Non-Medical): Not on file  Physical Activity:   . Days of Exercise per Week: Not on file  . Minutes of Exercise per Session: Not on file  Stress:   . Feeling of Stress : Not on file  Social Connections:   . Frequency of Communication with Friends and Family: Not on file  . Frequency of Social Gatherings with Friends and Family: Not on file  . Attends Religious Services: Not on file  . Active Member of Clubs or Organizations: Not on file  . Attends Banker Meetings: Not on file  . Marital Status: Not on file  Intimate Partner Violence:   . Fear of Current or Ex-Partner: Not on file  . Emotionally Abused: Not on file  . Physically Abused: Not on  file  . Sexually Abused: Not on file    Family History  Problem Relation Age of Onset  . Arthritis Mother   . Asthma Mother   . Hypertension Mother   . Thyroid disease Mother   . Diabetes Father   . Hypertension Maternal Grandmother   . Cancer Maternal Grandfather        squamous cell carcinoma  . Hypertension Maternal Grandfather   . Diabetes Maternal Grandfather   . Hypertension Paternal Grandmother   . Hypertension Paternal Grandfather      Review of Systems  Constitutional: Negative.   HENT: Negative.   Eyes: Negative.   Respiratory: Negative.   Cardiovascular: Negative.   Gastrointestinal: Negative.   Genitourinary: Negative.   Musculoskeletal:  Negative.   Skin: Negative.   Neurological: Positive for headaches (improved). Negative for dizziness, tingling, tremors, sensory change, speech change, focal weakness, seizures, loss of consciousness and weakness.  Psychiatric/Behavioral: Negative.      Physical Exam: BP 118/65   Pulse 77   Temp 98.4 F (36.9 C) (Oral)   Resp 18   Ht 6' (1.829 m)   Wt (!) 157.4 kg   LMP 05/05/2018 (LMP Unknown)   SpO2 98%   BMI 47.06 kg/m   Physical Exam Constitutional:      General: She is not in acute distress.    Appearance: Normal appearance. She is well-developed.  HENT:     Head: Normocephalic and atraumatic.  Eyes:     General: No scleral icterus.    Conjunctiva/sclera: Conjunctivae normal.  Cardiovascular:     Rate and Rhythm: Normal rate and regular rhythm.     Heart sounds: No murmur. No friction rub. No gallop.   Pulmonary:     Effort: Pulmonary effort is normal. No respiratory distress.     Breath sounds: Normal breath sounds. No wheezing or rales.  Abdominal:     General: Bowel sounds are normal. There is no distension.     Palpations: Abdomen is soft.     Comments: Gravid, NT  Musculoskeletal:        General: Normal range of motion.     Cervical back: Normal range of motion and neck supple.  Neurological:     General: No focal deficit present.     Mental Status: She is alert and oriented to person, place, and time.     Cranial Nerves: No cranial nerve deficit.  Skin:    General: Skin is warm and dry.     Findings: No erythema.  Psychiatric:        Mood and Affect: Mood normal.        Behavior: Behavior normal.        Judgment: Judgment normal.     Consults: None  Significant Findings/ Diagnostic Studies:  Lab Results  Component Value Date   WBC 11.3 (H) 02/14/2019   HGB 11.0 (L) 02/14/2019   HCT 32.9 (L) 02/14/2019   PLT 192 02/14/2019   CREATININE 0.64 02/14/2019   ALT 20 02/14/2019   AST 22 02/14/2019   PROTCRRATIO 0.08 02/14/2019     Procedures:   NST: Baseline FHR: 125 beats/min Variability: moderate Accelerations: present Decelerations: absent Tocometry: quiet  Interpretation:  INDICATIONS: rule out uterine contractions RESULTS:  A NST procedure was performed with FHR monitoring and a normal baseline established, appropriate time of 20-40 minutes of evaluation, and accels >2 seen w 15x15 characteristics.  Results show a REACTIVE NST.    Hospital Course: The patient was admitted to Labor  and Delivery Triage for observation. First the first three hours she did have some elevated blood pressures.  She then had all normal blood pressures.  After she had been here about 3.5 hours her blood pressure range was 104-137/48-89.  In the last 6 hours she was observed her blood pressure range was 104-129/48-74.  She was not treated with antihypertensive medication.  Her headache was initially treated with Tylenol.  Her pain score went down from 8/10 to 5/10.  She also stated that she felt much more like herself and felt normal apart from her headache.  She was then treated with a combination of compazine and benadryl, which did not help. She was given percocet for her headache, which did help and she was able to sleep. However, when she woke up her headache was still a 5/10.  Given her gestational age, and her blood pressures, she did not quite meet criteria for gestational hypertension according to [place] (June 2020).  According to the guidelines, elevated blood pressure of 140/90 taken correctly 4 or more hours apart would be the minimum criteria, which she did not meet.  Given she still had a headache with very normal blood pressures and some even low (104-129/48-74), I can't correlate as a severe feature as she doesn't meet criteria for preeclampsia or gestational hypertension. She has a new blood pressure cuff at home. She has an appointment later today at [facility].  Given the above, she was discharged with very strict precautions. I  spent about 10-15 minutes discussing in detail the blood pressures she should be looking for on her cuff (>140/90 or a combination of either one being elevated), symptoms she would be having (worsening headache with high blood pressures, visual changes and RUQ pain).  We discussed that she should come to labor and delivery if she should have any concerning symptoms at all.  I am discharging her due to her gestational age (<39 weeks) and no diagnosis of gestational hypertension. If she has any other elevated blood pressures, she would meet criteria for preeclampsia or gestational hypertension.  She agrees to keep her appointment later today to ensure very close follow up.    Discharge Condition: improved  Disposition: Discharge disposition: 01-Home or Self Care       Diet: Regular diet  Discharge Activity: she should probably avoid work, but otherwise has no specific activity limitations in general.   Allergies as of 02/15/2019   No Known Allergies     Medication List    TAKE these medications   acetaminophen 500 MG tablet Commonly known as: TYLENOL Take 2 tablets (1,000 mg total) by mouth every 6 (six) hours as needed for headache.   Prenate Mini 29-0.6-0.4-350 MG Caps Take 1 capsule by mouth daily.        Total time spent taking care of this patient: 120 minutes  Signed: Prentice Docker, MD  02/15/2019, 2:24 AM

## 2019-02-15 NOTE — Progress Notes (Signed)
  Subjective  Fetal Movement? yes Contractions? no Leaking Fluid? no Vaginal Bleeding? yes Headacge better today, as she wa sin hospital last night for headache and elevated BP.  She had R NST then and resolution of headache w meds.  BP normalized.  Denies blurry vision, epig pain, CP, or SOB.  Objective  BP 120/80   Wt (!) 353 lb (160.1 kg)   LMP 05/05/2018 (LMP Unknown)   BMI 47.88 kg/m  General: NAD Pumonary: no increased work of breathing Abdomen: gravid, non-tender Extremities: no edema Psychiatric: mood appropriate, affect full  Assessment  23 y.o. G1P0000 at [redacted]w[redacted]d by  03/03/2019, by Ultrasound presenting for routine prenatal visit  Plan   Problem List Items Addressed This Visit      Other   Supervision of high risk pregnancy, antepartum   BMI 45.0-49.9, adult (HCC)    Other Visit Diagnoses    [redacted] weeks gestation of pregnancy    -  Primary   Relevant Orders   POC Urinalysis Dipstick OB (Completed)      Pregnancy#1 Problems (from 05/05/18 to present)    Problem Noted Resolved   Supervision of high risk pregnancy, antepartum 01/11/2019 by Tresea Mall, CNM No   Overview Signed 01/28/2019  3:09 PM by Farrel Conners, CNM    Clinic [facility] Prenatal Labs  Dating 32 week ultrasoud Blood type: O/Positive/-- (12/16 1532)   Genetic Screen 1 Screen:    AFP:     Quad:     NIPS: Antibody:Negative (12/16 1532)  Anatomic [place] Normal, anterior placenta Rubella: 2.16 (12/16 1532) Varicella:Immune  GTT Early:               Third trimester: Hemoglobin A1C 5.1% RPR: Non Reactive (12/16 1532)   Rhogam  HBsAg: Negative (12/16 1532)   TDaP vaccine      01/25/19                 Flu Shot: HIV: Non Reactive (12/16 1532)   Baby Food      Breast                          GBS: NEG  Contraception unsure Pap:  CBB  No   CS/VBAC n/a   Support Person           BMI 45.0-49.9, adult (HCC) 01/11/2019 by Tresea Mall, CNM No   Obesity affecting pregnancy 01/11/2019 by Tresea Mall, CNM  No     Labor precautions discussed  Out of work note written for 2 weeks  Monitor for s/sx HTN, Labor  NST next week.  Review of ULTRASOUND.    I have personally reviewed images and report of recent ultrasound done at [facility].    Plan of management to be discussed with patient. Vtx, AFI 10.  Annamarie Major, MD, [facility], [facility] 02/15/2019  3:44 PM

## 2019-02-15 NOTE — Patient Instructions (Signed)
Braxton Hicks Contractions °Contractions of the uterus can occur throughout pregnancy, but they are not always a sign that you are in labor. You may have practice contractions called Braxton Hicks contractions. These false labor contractions are sometimes confused with true labor. °What are Braxton Hicks contractions? °Braxton Hicks contractions are tightening movements that occur in the muscles of the uterus before labor. Unlike true labor contractions, these contractions do not result in opening (dilation) and thinning of the cervix. Toward the end of pregnancy (32-34 weeks), Braxton Hicks contractions can happen more often and may become stronger. These contractions are sometimes difficult to tell apart from true labor because they can be very uncomfortable. You should not feel embarrassed if you go to the hospital with false labor. °Sometimes, the only way to tell if you are in true labor is for your health care provider to look for changes in the cervix. The health care provider will do a physical exam and may monitor your contractions. If you are not in true labor, the exam should show that your cervix is not dilating and your water has not broken. °If there are no other health problems associated with your pregnancy, it is completely safe for you to be sent home with false labor. You may continue to have Braxton Hicks contractions until you go into true labor. °How to tell the difference between true labor and false labor °True labor °· Contractions last 30-70 seconds. °· Contractions become very regular. °· Discomfort is usually felt in the top of the uterus, and it spreads to the lower abdomen and low back. °· Contractions do not go away with walking. °· Contractions usually become more intense and increase in frequency. °· The cervix dilates and gets thinner. °False labor °· Contractions are usually shorter and not as strong as true labor contractions. °· Contractions are usually irregular. °· Contractions  are often felt in the front of the lower abdomen and in the groin. °· Contractions may go away when you walk around or change positions while lying down. °· Contractions get weaker and are shorter-lasting as time goes on. °· The cervix usually does not dilate or become thin. °Follow these instructions at home: ° °· Take over-the-counter and prescription medicines only as told by your health care provider. °· Keep up with your usual exercises and follow other instructions from your health care provider. °· Eat and drink lightly if you think you are going into labor. °· If Braxton Hicks contractions are making you uncomfortable: °? Change your position from lying down or resting to walking, or change from walking to resting. °? Sit and rest in a tub of warm water. °? Drink enough fluid to keep your urine pale yellow. Dehydration may cause these contractions. °? Do slow and deep breathing several times an hour. °· Keep all follow-up prenatal visits as told by your health care provider. This is important. °Contact a health care provider if: °· You have a fever. °· You have continuous pain in your abdomen. °Get help right away if: °· Your contractions become stronger, more regular, and closer together. °· You have fluid leaking or gushing from your vagina. °· You pass blood-tinged mucus (bloody show). °· You have bleeding from your vagina. °· You have low back pain that you never had before. °· You feel your baby’s head pushing down and causing pelvic pressure. °· Your baby is not moving inside you as much as it used to. °Summary °· Contractions that occur before labor are   called Braxton Hicks contractions, false labor, or practice contractions. °· Braxton Hicks contractions are usually shorter, weaker, farther apart, and less regular than true labor contractions. True labor contractions usually become progressively stronger and regular, and they become more frequent. °· Manage discomfort from Braxton Hicks contractions  by changing position, resting in a warm bath, drinking plenty of water, or practicing deep breathing. °This information is not intended to replace advice given to you by your health care provider. Make sure you discuss any questions you have with your health care provider. °Document Revised: 12/18/2016 Document Reviewed: 05/21/2016 °Elsevier Patient Education © 2020 [employer] ° °

## 2019-02-20 ENCOUNTER — Other Ambulatory Visit: Payer: Self-pay

## 2019-02-20 ENCOUNTER — Ambulatory Visit (INDEPENDENT_AMBULATORY_CARE_PROVIDER_SITE_OTHER): Payer: Commercial Managed Care - PPO | Admitting: Certified Nurse Midwife

## 2019-02-20 VITALS — BP 130/86 | Wt 355.0 lb

## 2019-02-20 DIAGNOSIS — Z3A38 38 weeks gestation of pregnancy: Secondary | ICD-10-CM

## 2019-02-20 DIAGNOSIS — O99891 Other specified diseases and conditions complicating pregnancy: Secondary | ICD-10-CM

## 2019-02-20 DIAGNOSIS — O99213 Obesity complicating pregnancy, third trimester: Secondary | ICD-10-CM

## 2019-02-20 DIAGNOSIS — Z6841 Body Mass Index (BMI) 40.0 and over, adult: Secondary | ICD-10-CM

## 2019-02-20 DIAGNOSIS — O0990 Supervision of high risk pregnancy, unspecified, unspecified trimester: Secondary | ICD-10-CM

## 2019-02-20 DIAGNOSIS — Z0131 Encounter for examination of blood pressure with abnormal findings: Secondary | ICD-10-CM

## 2019-02-20 DIAGNOSIS — Z01812 Encounter for preprocedural laboratory examination: Secondary | ICD-10-CM

## 2019-02-20 DIAGNOSIS — Z20822 Contact with and (suspected) exposure to covid-19: Secondary | ICD-10-CM

## 2019-02-20 LAB — FETAL NONSTRESS TEST

## 2019-02-20 LAB — POCT URINALYSIS DIPSTICK OB
Glucose, UA: NEGATIVE
POC,PROTEIN,UA: NEGATIVE

## 2019-02-20 NOTE — Progress Notes (Signed)
C/o some ctxs; ck cx;

## 2019-02-20 NOTE — Progress Notes (Signed)
HROB at 38wk3d: Does not feel well. Anxious concerning borderline blood pressures. Seen in hospital last week for elevated blood pressures and headaches. ?gestational hypertension. Baby active. Mild irregular contractions and pelvic pressure.  NST reactive with baseline 140-145 with accelerations to 170 to 180, moderate variability BP 130/88 . Negative protein dip Cervix: closed/50%/posterior/med/ -2  A: IUP at 38wk3d with ? Gestational hypertension  P: Discussed POM with DR Bonney Aid. He recommends IOL at 39 weeks IOL scheduled for Sunday 7 Feb at 0800 Discussed methods of induction including Cytotec, foley bulb and Pitocin ROB/ NST/AFI on 4 Feb Covid testing 5 Feb at [facility], [place]

## 2019-02-23 ENCOUNTER — Ambulatory Visit (INDEPENDENT_AMBULATORY_CARE_PROVIDER_SITE_OTHER): Payer: Commercial Managed Care - PPO | Admitting: Obstetrics & Gynecology

## 2019-02-23 ENCOUNTER — Other Ambulatory Visit: Payer: Self-pay

## 2019-02-23 ENCOUNTER — Ambulatory Visit (INDEPENDENT_AMBULATORY_CARE_PROVIDER_SITE_OTHER): Payer: Commercial Managed Care - PPO

## 2019-02-23 ENCOUNTER — Encounter: Payer: Self-pay | Admitting: Obstetrics & Gynecology

## 2019-02-23 VITALS — BP 120/80 | Wt 351.0 lb

## 2019-02-23 DIAGNOSIS — Z3A38 38 weeks gestation of pregnancy: Secondary | ICD-10-CM

## 2019-02-23 DIAGNOSIS — O99213 Obesity complicating pregnancy, third trimester: Secondary | ICD-10-CM

## 2019-02-23 DIAGNOSIS — O0990 Supervision of high risk pregnancy, unspecified, unspecified trimester: Secondary | ICD-10-CM

## 2019-02-23 DIAGNOSIS — O133 Gestational [pregnancy-induced] hypertension without significant proteinuria, third trimester: Secondary | ICD-10-CM

## 2019-02-23 DIAGNOSIS — O0993 Supervision of high risk pregnancy, unspecified, third trimester: Secondary | ICD-10-CM

## 2019-02-23 DIAGNOSIS — Z6841 Body Mass Index (BMI) 40.0 and over, adult: Secondary | ICD-10-CM

## 2019-02-23 LAB — FETAL NONSTRESS TEST

## 2019-02-23 LAB — POCT URINALYSIS DIPSTICK OB
Glucose, UA: NEGATIVE
POC,PROTEIN,UA: NEGATIVE

## 2019-02-23 NOTE — Progress Notes (Signed)
History and Physical  Mitsy Reinertsen is a 24 y.o. G1P0000 [redacted]w[redacted]d  for Induction of Labor scheduled due to Gestational hypertension .   See labor record for pregnancy highlights.  No recent pain, bleeding, ruptured membranes, or other signs of progressing labor.  BP and headaches have improved with bed rest therapy over the last week.  Obesity also a risk factor this pregnancy along with late (32 weeks) onset to care.  PMHx: She  has a past medical history of No known health problems. Also,  has a past surgical history that includes No past surgeries., family history includes Arthritis in her mother; Asthma in her mother; Cancer in her maternal grandfather; Diabetes in her father and maternal grandfather; Hypertension in her maternal grandfather, maternal grandmother, mother, paternal grandfather, and paternal grandmother; Thyroid disease in her mother.,  reports that she has never smoked. She has never used smokeless tobacco. She reports that she does not drink alcohol or use drugs. She has a current medication list which includes the following prescription(s): acetaminophen and prenate mini. Also, has No Known Allergies. OB History  Gravida Para Term Preterm AB Living  1 0 0        SAB TAB Ectopic Multiple Live Births               # Outcome Date GA Lbr Len/2nd Weight Sex Delivery Anes PTL Lv  1 Current           Patient denies any other pertinent gynecologic issues.   Review of Systems  All other systems reviewed and are negative.   Objective: BP 120/80   Wt (!) 351 lb (159.2 kg)   LMP 05/05/2018 (LMP Unknown)   BMI 47.60 kg/m  Physical Exam Constitutional:      General: She is not in acute distress.    Appearance: She is well-developed.  HENT:     Head: Normocephalic and atraumatic. No laceration.     Right Ear: Hearing normal.     Left Ear: Hearing normal.     Mouth/Throat:     Pharynx: Uvula midline.  Eyes:     Pupils: Pupils are equal, round, and reactive to light.  Neck:      Thyroid: No thyromegaly.  Cardiovascular:     Rate and Rhythm: Normal rate and regular rhythm.     Heart sounds: No murmur. No friction rub. No gallop.   Pulmonary:     Effort: Pulmonary effort is normal. No respiratory distress.     Breath sounds: Normal breath sounds. No wheezing.  Chest:     Breasts:        Right: No mass, skin change or tenderness.        Left: No mass, skin change or tenderness.  Abdominal:     General: Bowel sounds are normal. There is no distension.     Palpations: Abdomen is soft.     Tenderness: There is no abdominal tenderness. There is no rebound.     Comments: Gravid, NT  Musculoskeletal:        General: Normal range of motion.     Cervical back: Normal range of motion and neck supple.  Neurological:     Mental Status: She is alert and oriented to person, place, and time.     Cranial Nerves: No cranial nerve deficit.  Skin:    General: Skin is warm and dry.  Psychiatric:        Judgment: Judgment normal.  Vitals reviewed.   A NST procedure  was performed with FHR monitoring and a normal baseline established, appropriate time of 20-40 minutes of evaluation, and accels >2 seen w 15x15 characteristics.  Results show a REACTIVE NST.   Review of ULTRASOUND.    I have personally reviewed images and report of recent ultrasound done at [facility].    Plan of management to be discussed with patient. AFI 10, Vtx  Assessment: Term Pregnancy for Induction of Labor due to Gestational hypertension.  Plan: Patient will undergo induction of labor with cervical ripening agents.     Patient has been fully informed of the pros and cons, risks and benefits of continued observation with fetal monitoring versus that of induction of labor.   She understands that there are uncommon risks to induction, which include but are not limited to : frequent or prolonged uterine contractions, fetal distress, uterine rupture, and lack of successful induction.  These risks include  all methods including Pitocin and Misoprostol and Cervadil.  Patient understands that using Misoprostol for labor induction is an "off label" indication although it has been studied extensively for this purpose and is an accepted method of induction.  She also has been informed of the increased risks for Cesarean with induction and should induction not be successful.  Patient consents to the induction plan of management.  Plans to breast feed Plans (uncertain) for contraception TDaP UTD  Clinic [facility] Prenatal Labs  Dating 32 week ultrasoud Blood type: O/Positive/-- (12/16 1532)   Genetic Screen Late onet, not done Antibody:Negative (12/16 1532)  Anatomic [place] Normal, anterior placenta Rubella: 2.16 (12/16 1532) Varicella:Immune  GTT Third trimester: Hemoglobin A1C 5.1% RPR: Non Reactive (12/16 1532)   Rhogam n/a HBsAg: Negative (12/16 1532)   TDaP vaccine      01/25/19                HIV: Non Reactive (12/16 1532)   Baby Food      Breast                          OQH:UTMLYYTK/-- (01/22 1500)  Contraception Unsure Pap:12/20 nml   Annamarie Major, MD, [facility], [facility] 02/23/2019  10:28 AM

## 2019-02-23 NOTE — Patient Instructions (Signed)

## 2019-02-24 ENCOUNTER — Other Ambulatory Visit
Admission: RE | Admit: 2019-02-24 | Discharge: 2019-02-24 | Disposition: A | Discharge status: Home / Self Care | Payer: Commercial Managed Care - PPO | Source: Ambulatory Visit | Attending: Obstetrics & Gynecology | Admitting: Obstetrics & Gynecology

## 2019-02-24 ENCOUNTER — Other Ambulatory Visit: Payer: Self-pay

## 2019-02-24 ENCOUNTER — Inpatient Hospital Stay
Admission: EM | Admit: 2019-02-24 | Discharge: 2019-02-27 | DRG: 806 | Disposition: A | Discharge status: Home / Self Care | Payer: Commercial Managed Care - PPO | Attending: Obstetrics and Gynecology | Admitting: Obstetrics and Gynecology

## 2019-02-24 ENCOUNTER — Encounter: Payer: Self-pay | Admitting: Obstetrics and Gynecology

## 2019-02-24 DIAGNOSIS — Z3490 Encounter for supervision of normal pregnancy, unspecified, unspecified trimester: Secondary | ICD-10-CM | POA: Diagnosis not present

## 2019-02-24 DIAGNOSIS — D62 Acute posthemorrhagic anemia: Secondary | ICD-10-CM | POA: Diagnosis not present

## 2019-02-24 DIAGNOSIS — Z3A39 39 weeks gestation of pregnancy: Secondary | ICD-10-CM

## 2019-02-24 DIAGNOSIS — Z20822 Contact with and (suspected) exposure to covid-19: Secondary | ICD-10-CM | POA: Diagnosis present

## 2019-02-24 DIAGNOSIS — O99213 Obesity complicating pregnancy, third trimester: Secondary | ICD-10-CM

## 2019-02-24 DIAGNOSIS — Z6841 Body Mass Index (BMI) 40.0 and over, adult: Secondary | ICD-10-CM

## 2019-02-24 DIAGNOSIS — O99214 Obesity complicating childbirth: Secondary | ICD-10-CM | POA: Diagnosis present

## 2019-02-24 DIAGNOSIS — O0990 Supervision of high risk pregnancy, unspecified, unspecified trimester: Secondary | ICD-10-CM

## 2019-02-24 DIAGNOSIS — O139 Gestational [pregnancy-induced] hypertension without significant proteinuria, unspecified trimester: Secondary | ICD-10-CM | POA: Diagnosis present

## 2019-02-24 DIAGNOSIS — O134 Gestational [pregnancy-induced] hypertension without significant proteinuria, complicating childbirth: Principal | ICD-10-CM | POA: Diagnosis present

## 2019-02-24 DIAGNOSIS — O9081 Anemia of the puerperium: Secondary | ICD-10-CM | POA: Diagnosis not present

## 2019-02-24 LAB — COMPREHENSIVE METABOLIC PANEL
ALT: 21 U/L (ref 0–44)
AST: 20 U/L (ref 15–41)
Albumin: 3.3 g/dL — ABNORMAL LOW (ref 3.5–5.0)
Alkaline Phosphatase: 193 U/L — ABNORMAL HIGH (ref 38–126)
Anion gap: 11 (ref 5–15)
BUN: 12 mg/dL (ref 6–20)
CO2: 22 mmol/L (ref 22–32)
Calcium: 9.5 mg/dL (ref 8.9–10.3)
Chloride: 104 mmol/L (ref 98–111)
Creatinine, Ser: 0.62 mg/dL (ref 0.44–1.00)
GFR calc Af Amer: >60 mL/min (ref >60)
GFR calc non Af Amer: >60 mL/min (ref >60)
Glucose, Bld: 91 mg/dL (ref 70–99)
Potassium: 3.9 mmol/L (ref 3.5–5.1)
Sodium: 137 mmol/L (ref 135–145)
Total Bilirubin: 0.4 mg/dL (ref 0.3–1.2)
Total Protein: 6.9 g/dL (ref 6.5–8.1)

## 2019-02-24 LAB — CBC
HCT: 36.3 % (ref 36.0–46.0)
Hemoglobin: 12 g/dL (ref 12.0–15.0)
MCH: 30.3 pg (ref 26.0–34.0)
MCHC: 33.1 g/dL (ref 30.0–36.0)
MCV: 91.7 fL (ref 80.0–100.0)
Platelets: 241 10*3/uL (ref 150–400)
RBC: 3.96 MIL/uL (ref 3.87–5.11)
RDW: 12.2 % (ref 11.5–15.5)
WBC: 13.6 10*3/uL — ABNORMAL HIGH (ref 4.0–10.5)
nRBC: 0 % (ref 0.0–0.2)

## 2019-02-24 LAB — SARS CORONAVIRUS 2 (TAT 6-24 HRS): SARS Coronavirus 2: NEGATIVE

## 2019-02-24 LAB — PROTEIN / CREATININE RATIO, URINE
Creatinine, Urine: 153 mg/dL
Protein Creatinine Ratio: 0.09 mg/mg{Cre} (ref 0.00–0.15)
Total Protein, Urine: 14 mg/dL

## 2019-02-24 LAB — RUPTURE OF MEMBRANE (ROM)PLUS: Rom Plus: NEGATIVE

## 2019-02-24 MED ORDER — MISOPROSTOL 25 MCG QUARTER TABLET
25.0000 ug | ORAL_TABLET | ORAL | Status: DC | PRN
  Administered 2019-02-25 (×3): 25 ug via VAGINAL
  Filled 2019-02-24 (×4): qty 1

## 2019-02-24 MED ORDER — ACETAMINOPHEN 325 MG PO TABS: 650.0000 mg | ORAL_TABLET | ORAL | Status: DC | PRN

## 2019-02-24 MED ORDER — LACTATED RINGERS IV SOLN: 500.0000 mL | INTRAVENOUS | Status: DC | PRN

## 2019-02-24 MED ORDER — OXYTOCIN BOLUS FROM INFUSION
500.0000 mL | Freq: Once | INTRAVENOUS | Status: AC
  Administered 2019-02-26: 500 mL via INTRAVENOUS

## 2019-02-24 MED ORDER — LIDOCAINE HCL (PF) 1 % IJ SOLN
30.0000 mL | INTRAMUSCULAR | Status: AC | PRN
  Administered 2019-02-26: 2 mL via SUBCUTANEOUS

## 2019-02-24 MED ORDER — SOD CITRATE-CITRIC ACID 500-334 MG/5ML PO SOLN: 30.0000 mL | ORAL | Status: DC | PRN

## 2019-02-24 MED ORDER — ONDANSETRON HCL 4 MG/2ML IJ SOLN
4.0000 mg | Freq: Four times a day (QID) | INTRAMUSCULAR | Status: DC | PRN
  Administered 2019-02-26: 4 mg via INTRAVENOUS
  Filled 2019-02-24: qty 2

## 2019-02-24 MED ORDER — LACTATED RINGERS IV SOLN: INTRAVENOUS | Status: DC

## 2019-02-24 MED ORDER — TERBUTALINE SULFATE 1 MG/ML IJ SOLN: 0.2500 mg | Freq: Once | INTRAMUSCULAR | Status: DC | PRN

## 2019-02-24 MED ORDER — OXYTOCIN 40 UNITS IN NORMAL SALINE INFUSION - SIMPLE MED
2.5000 [IU]/h | INTRAVENOUS | Status: DC
  Administered 2019-02-26: 2.5 [IU]/h via INTRAVENOUS
  Filled 2019-02-24 (×2): qty 1000

## 2019-02-24 NOTE — H&P (Signed)
Date of Initial H&P: 02/23/2019  History reviewed, patient examined, now with GHTN based on BP at presentation within the mild range. No headaches or vision changes.  Will start IOL with cytotec.

## 2019-02-24 NOTE — OB Triage Note (Signed)
Pt is a G1P0 and [redacted]w[redacted]d presenting to L&D with c/o gush of fluid around 2030 tonight. Pt reports the fluid to be clear and denies VB. Pt reports lower back cramping 5/10 on 0-10 pain scale. Pt reports positive fetal movement. Initial FHT 150. Monitors applied and assessing.

## 2019-02-25 LAB — CHLAMYDIA/NGC RT PCR (ARMC ONLY)
Chlamydia Tr: NOT DETECTED
N gonorrhoeae: NOT DETECTED

## 2019-02-25 LAB — RPR: RPR Ser Ql: NONREACTIVE

## 2019-02-25 LAB — TYPE AND SCREEN
ABO/RH(D): O POS
Antibody Screen: NEGATIVE

## 2019-02-25 MED ORDER — SODIUM CHLORIDE (PF) 0.9 % IJ SOLN
INTRAMUSCULAR | Status: AC
  Filled 2019-02-25: qty 50

## 2019-02-25 MED ORDER — BUTORPHANOL TARTRATE 1 MG/ML IJ SOLN
INTRAMUSCULAR | Status: AC
  Administered 2019-02-25: 1 mg
  Filled 2019-02-25: qty 1

## 2019-02-25 MED ORDER — AMMONIA AROMATIC IN INHA
RESPIRATORY_TRACT | Status: AC
  Filled 2019-02-25: qty 10

## 2019-02-25 MED ORDER — OXYTOCIN 40 UNITS IN NORMAL SALINE INFUSION - SIMPLE MED
1.0000 m[IU]/min | INTRAVENOUS | Status: DC
  Administered 2019-02-25 – 2019-02-26 (×2): 2 m[IU]/min via INTRAVENOUS

## 2019-02-25 MED ORDER — LIDOCAINE HCL (PF) 1 % IJ SOLN
INTRAMUSCULAR | Status: AC
  Filled 2019-02-25: qty 30

## 2019-02-25 MED ORDER — OXYTOCIN 10 UNIT/ML IJ SOLN
INTRAMUSCULAR | Status: AC
  Filled 2019-02-25: qty 2

## 2019-02-25 MED ORDER — BUTORPHANOL TARTRATE 1 MG/ML IJ SOLN
1.0000 mg | INTRAMUSCULAR | Status: DC | PRN
  Administered 2019-02-26: 01:00:00 1 mg via INTRAVENOUS
  Filled 2019-02-25: qty 1

## 2019-02-25 MED ORDER — MISOPROSTOL 200 MCG PO TABS
ORAL_TABLET | ORAL | Status: AC
  Administered 2019-02-25: 25 ug via VAGINAL
  Filled 2019-02-25: qty 4

## 2019-02-25 MED ORDER — TERBUTALINE SULFATE 1 MG/ML IJ SOLN: 0.2500 mg | Freq: Once | INTRAMUSCULAR | Status: DC | PRN

## 2019-02-25 NOTE — Progress Notes (Addendum)
L&D Progress Note  Has received 4 doses of Cytotec. LD at 1400  S: Contractions getting stronger. Would like pain medicine   O: BP 137/79 (BP Location: Right Arm)   Pulse 93   Temp 98.3 F (36.8 C) (Oral)   Resp 16   Ht 6' (1.829 m)   Wt (!) 159.2 kg   LMP 05/05/2018 (LMP Unknown)   BMI 47.60 kg/m    General: appears uncomfortable. Was up walking and ate regular diet for supper  FHR: 150 baseline with accelerations to 170s, moderate variability Toco: difficulty picking up contractions-some every 2-3 minutes apart   Cervix: FT/60%/-1/ soft/ brown bloody show  A: IOL in progress-slow progression FWB: Cat 1 Gestational hypertension: normal to mild range blood pressure  P: Start Pitocin Monitor progress and maternal fetal well being.   Farrel Conners, CNM

## 2019-02-25 NOTE — Progress Notes (Addendum)
L&D Progress Note   24 year old G1 P0 with EDC=03/03/2019 admitted last night with GHTN for IOL Received 2 doses of Cytotec. LD 0410  S: Contracting. Rates contractions at a 4/10  O: BP 110/60   Pulse 75   Temp 97.9 F (36.6 C) (Oral)   Resp 16   Ht 6' (1.829 m)   Wt (!) 159.2 kg   LMP 05/05/2018 (LMP Unknown)   BMI 47.60 kg/m   Wide variation in blood pressures from mild to normal range  General: was sleeping thru contractions this AM Based upon 12/23 EFW of 4#5oz,  current EFW around 7 1/2# FHR: 130s baseline with accelerations to 160s to 180, moderate variability Toco: contractions mild, ?q2-6 min apart. Difficulty picking them up with external monitor  Cervix: closed/ 50%/-2 to -3  A: IOL for GHTN at 39wk1d in progress FWB: Cat 1 tracing  P: Third Cytotec 25 mcg PV inserted Plan foley bulb when possible Will try Monica monitor Regular diet for breakfast  Farrel Conners, CNM

## 2019-02-26 ENCOUNTER — Inpatient Hospital Stay: Payer: Commercial Managed Care - PPO | Admitting: Anesthesiology

## 2019-02-26 ENCOUNTER — Encounter: Payer: Self-pay | Admitting: Obstetrics and Gynecology

## 2019-02-26 DIAGNOSIS — Z3490 Encounter for supervision of normal pregnancy, unspecified, unspecified trimester: Secondary | ICD-10-CM | POA: Diagnosis not present

## 2019-02-26 DIAGNOSIS — O134 Gestational [pregnancy-induced] hypertension without significant proteinuria, complicating childbirth: Secondary | ICD-10-CM

## 2019-02-26 DIAGNOSIS — O99214 Obesity complicating childbirth: Secondary | ICD-10-CM

## 2019-02-26 MED ORDER — PRENATAL MULTIVITAMIN CH
1.0000 | ORAL_TABLET | Freq: Every day | ORAL | Status: DC
  Administered 2019-02-27: 1 via ORAL
  Filled 2019-02-26: qty 1

## 2019-02-26 MED ORDER — DIPHENHYDRAMINE HCL 50 MG/ML IJ SOLN: 12.5000 mg | INTRAMUSCULAR | Status: DC | PRN

## 2019-02-26 MED ORDER — ONDANSETRON HCL 4 MG/2ML IJ SOLN: 4.0000 mg | INTRAMUSCULAR | Status: DC | PRN

## 2019-02-26 MED ORDER — FENTANYL 2.5 MCG/ML W/ROPIVACAINE 0.15% IN NS 100 ML EPIDURAL (ARMC)
12.0000 mL/h | EPIDURAL | Status: DC
  Administered 2019-02-26: 12 mL/h via EPIDURAL
  Filled 2019-02-26 (×2): qty 100

## 2019-02-26 MED ORDER — BENZOCAINE-MENTHOL 20-0.5 % EX AERO
INHALATION_SPRAY | CUTANEOUS | Status: AC
  Administered 2019-02-27: 1 via TOPICAL
  Filled 2019-02-26: qty 56

## 2019-02-26 MED ORDER — ONDANSETRON HCL 4 MG PO TABS: 4.0000 mg | ORAL_TABLET | ORAL | Status: DC | PRN

## 2019-02-26 MED ORDER — SIMETHICONE 80 MG PO CHEW: 80.0000 mg | CHEWABLE_TABLET | ORAL | Status: DC | PRN

## 2019-02-26 MED ORDER — OXYCODONE HCL 5 MG PO TABS: 5.0000 mg | ORAL_TABLET | ORAL | Status: DC | PRN

## 2019-02-26 MED ORDER — FERROUS SULFATE 325 (65 FE) MG PO TABS
325.0000 mg | ORAL_TABLET | Freq: Every day | ORAL | Status: DC
  Administered 2019-02-27: 325 mg via ORAL
  Filled 2019-02-26: qty 1

## 2019-02-26 MED ORDER — LACTATED RINGERS IV SOLN
500.0000 mL | Freq: Once | INTRAVENOUS | Status: AC
  Administered 2019-02-26: 500 mL via INTRAVENOUS

## 2019-02-26 MED ORDER — PHENYLEPHRINE 40 MCG/ML (10ML) SYRINGE FOR IV PUSH (FOR BLOOD PRESSURE SUPPORT): 80.0000 ug | PREFILLED_SYRINGE | INTRAVENOUS | Status: DC | PRN

## 2019-02-26 MED ORDER — COCONUT OIL OIL
1.0000 "application " | TOPICAL_OIL | Status: DC | PRN
  Administered 2019-02-27: 1 via TOPICAL
  Filled 2019-02-26: qty 120

## 2019-02-26 MED ORDER — EPHEDRINE 5 MG/ML INJ: 10.0000 mg | INTRAVENOUS | Status: DC | PRN

## 2019-02-26 MED ORDER — ACETAMINOPHEN 500 MG PO TABS
1000.0000 mg | ORAL_TABLET | Freq: Four times a day (QID) | ORAL | Status: DC | PRN
  Administered 2019-02-27: 1000 mg via ORAL
  Filled 2019-02-26: qty 2

## 2019-02-26 MED ORDER — SENNOSIDES-DOCUSATE SODIUM 8.6-50 MG PO TABS
2.0000 | ORAL_TABLET | ORAL | Status: DC
  Administered 2019-02-27: 2 via ORAL
  Filled 2019-02-26: qty 2

## 2019-02-26 MED ORDER — BENZOCAINE-MENTHOL 20-0.5 % EX AERO
1.0000 "application " | INHALATION_SPRAY | CUTANEOUS | Status: DC | PRN
  Filled 2019-02-26: qty 56

## 2019-02-26 MED ORDER — IBUPROFEN 600 MG PO TABS
600.0000 mg | ORAL_TABLET | Freq: Four times a day (QID) | ORAL | Status: DC
  Administered 2019-02-26 – 2019-02-27 (×4): 600 mg via ORAL
  Filled 2019-02-26 (×4): qty 1

## 2019-02-26 MED ORDER — BUTORPHANOL TARTRATE 1 MG/ML IJ SOLN
2.0000 mg | INTRAMUSCULAR | Status: DC | PRN
  Administered 2019-02-26: 02:00:00 2 mg via INTRAVENOUS
  Filled 2019-02-26: qty 2

## 2019-02-26 MED ORDER — FENTANYL 2.5 MCG/ML W/ROPIVACAINE 0.15% IN NS 100 ML EPIDURAL (ARMC)
EPIDURAL | Status: AC
  Administered 2019-02-26: 14 mL/h via EPIDURAL
  Filled 2019-02-26: qty 100

## 2019-02-26 MED ORDER — SODIUM CHLORIDE 0.9 % IV SOLN
INTRAVENOUS | Status: DC | PRN
  Administered 2019-02-26 (×5): 5 mL via EPIDURAL

## 2019-02-26 MED ORDER — DIBUCAINE (PERIANAL) 1 % EX OINT: 1.0000 "application " | TOPICAL_OINTMENT | CUTANEOUS | Status: DC | PRN

## 2019-02-26 MED ORDER — WITCH HAZEL-GLYCERIN EX PADS: 1.0000 "application " | MEDICATED_PAD | CUTANEOUS | Status: DC | PRN

## 2019-02-26 NOTE — Progress Notes (Signed)
L&D Progress NOte   S: Contractions increasing in intensity  O: General: appears uncomfortable  Vital signs: BP (!) 142/79   Pulse 94   Temp 98.3 F (36.8 C) (Oral)   Resp 16   Ht 6' (1.829 m)   Wt (!) 159.2 kg   LMP 05/05/2018 (LMP Unknown)   BMI 47.60 kg/m    FHR: 140 baseline with accelerations to 160s, moderate variability Toco: contractions q2-4 minutes with some coupling on 18 miu/min Pitocin Cervix: 1/60%/-1/mid  A: Some progress FWB:Cat 1 tracing   P: Foley with 30 cc bulb inserted in cervix Stadol for pain Epidural when becomes more active. Plan AROM when foley bulb falls out.  Farrel Conners, CNM

## 2019-02-26 NOTE — Anesthesia Procedure Notes (Signed)
Epidural Patient location during procedure: OB Start time: 02/26/2019 7:49 AM End time: 02/26/2019 8:10 AM  Staffing Anesthesiologist: Karleen Hampshire, MD Performed: anesthesiologist   Preanesthetic Checklist Completed: patient identified, IV checked, site marked, risks and benefits discussed, surgical consent, monitors and equipment checked, pre-op evaluation and timeout performed  Epidural Patient position: sitting Prep: ChloraPrep Patient monitoring: heart rate, continuous pulse ox and blood pressure Approach: midline Location: L3-L4 Injection technique: LOR saline  Needle:  Needle type: Tuohy  Needle gauge: 18 G Needle length: 9 cm and 9 Needle insertion depth: 9 cm Catheter type: closed end flexible Catheter size: 20 Guage Catheter at skin depth: 14 cm Test dose: negative and Other  Assessment Events: blood not aspirated, injection not painful, no injection resistance, no paresthesia and negative IV test  Additional Notes Risks and benefits of procedure discussed with patient.  Risks including but not limited to infection, spinal/epidural hematoma, nerve injury, post dural puncture headache, and inadequate/failed block.  Patient expressed understanding and consented to epidural placement. Negative dural puncture.  Negative aspiration.  Dose given in divided aliquots.  Patient tolerated the procedure well with no immediate complications.  Reason for block:procedure for pain

## 2019-02-26 NOTE — Anesthesia Preprocedure Evaluation (Signed)
Anesthesia Evaluation  Patient identified by MRN, date of birth, ID band Patient awake    Reviewed: Allergy & Precautions, H&P , NPO status , Patient's Chart, lab work & pertinent test results  Airway Mallampati: I  TM Distance: >3 FB Neck ROM: full    Dental  (+) Teeth Intact   Pulmonary neg pulmonary ROS,           Cardiovascular Exercise Tolerance: Good hypertension, (-) pacemaker (gHTN)     Neuro/Psych PSYCHIATRIC DISORDERS Depression    GI/Hepatic negative GI ROS,   Endo/Other  Morbid obesity  Renal/GU   negative genitourinary   Musculoskeletal   Abdominal   Peds  Hematology negative hematology ROS (+)   Anesthesia Other Findings Past Medical History: No date: No known health problems  Past Surgical History: No date: NO PAST SURGERIES  BMI    Body Mass Index: 47.60 kg/m      Reproductive/Obstetrics (+) Pregnancy                             Anesthesia Physical Anesthesia Plan  ASA: III  Anesthesia Plan: Epidural   Post-op Pain Management:    Induction:   PONV Risk Score and Plan:   Airway Management Planned:   Additional Equipment:   Intra-op Plan:   Post-operative Plan:   Informed Consent: I have reviewed the patients History and Physical, chart, labs and discussed the procedure including the risks, benefits and alternatives for the proposed anesthesia with the patient or authorized representative who has indicated his/her understanding and acceptance.       Plan Discussed with: Anesthesiologist  Anesthesia Plan Comments:         Anesthesia Quick Evaluation

## 2019-02-26 NOTE — Plan of Care (Signed)
Transferred to Room 337. Alert and oriented with pleasant affect. Assessment WNL Pt. Oriented to room, POC and Fall Prevention as well Safety and Security and Pt. V/O.

## 2019-02-26 NOTE — Discharge Summary (Signed)
Physician Obstetric Discharge Summary  Patient ID: Claire Walker MRN: [id number] DOB/AGE: 25/24/1997 24 y.o.   Date of Admission: 02/24/2019 Date of Delivery: 02/26/2019 Delivering Provider: Homero Fellers, MD Date of Discharge:   Admitting Diagnosis: Induction of labor at [redacted]w[redacted]d  Secondary Diagnosis: Gestational hypertension  Mode of Delivery: normal spontaneous vaginal delivery 02/26/2019      Discharge Diagnosis: Term intrauterine pregnancy-delivered. Gestational hypertension   Intrapartum Procedures: Atificial rupture of membranes, epidural, placement of fetal scalp electrode, placement of intrauterine catheter and foley bulb, Cytotec induction, Pitocin augmentation. And repair of vaginal and bilateral labial lacerations   Post partum procedures: none  Complications: none   Brief Hospital Course  Claire Walker is a [id number] who had a SVD on 02/26/2019 after an induction of labor for gestational hypertension;  for further details of this delivery, please refer to the delivery note.  Patient had an uncomplicated postpartum course.  By time of discharge on PPD#1, her pain was controlled on oral pain medications; she had appropriate lochia and was ambulating, voiding without difficulty and tolerating regular diet.  She was deemed stable for discharge to home.    Labs: CBC Latest Ref Rng & Units 02/27/2019 02/24/2019 02/14/2019  WBC 4.0 - 10.5 K/uL 22.6(H) 13.6(H) 11.3(H)  Hemoglobin 12.0 - 15.0 g/dL 10.7(L) 12.0 11.0(L)  Hematocrit 36.0 - 46.0 % 31.8(L) 36.3 32.9(L)  Platelets 150 - 400 K/uL 196 241 192   O POS  Physical exam:  Blood pressure (!) 117/50, pulse 81, temperature 98.4 F (36.9 C), temperature source Oral, resp. rate 20, height 6' (1.829 m), weight (!) 159.2 kg, last menstrual period 05/05/2018, SpO2 100 %, unknown if currently breastfeeding. General: alert and no distress Lochia: appropriate Abdomen: soft, NT Uterine Fundus: firm Extremities: No evidence of DVT seen on  physical exam. No lower extremity edema.  Discharge Instructions: Per After Visit Summary. Activity: Advance as tolerated. Pelvic rest for 6 weeks.  Also refer to Discharge Instructions Diet: Regular Medications: Allergies as of 02/27/2019   No Known Allergies     Medication List    TAKE these medications   acetaminophen 500 MG tablet Commonly known as: TYLENOL Take 2 tablets (1,000 mg total) by mouth every 6 (six) hours as needed for headache.   docusate sodium 100 MG capsule Commonly known as: Colace Take 1 capsule (100 mg total) by mouth 2 (two) times daily.   ferrous sulfate 325 (65 FE) MG tablet Take 1 tablet (325 mg total) by mouth daily with breakfast. Start taking on: February 28, 2019   ibuprofen 600 MG tablet Commonly known as: ADVIL Take 1 tablet (600 mg total) by mouth every 6 (six) hours.   Prenate Mini 29-0.6-0.4-350 MG Caps Take 1 capsule by mouth daily.            Discharge Care Instructions  (From admission, onward)         Start     Ordered   02/27/19 0000  Discharge wound care:    Comments: SHOWER DAILY Wash incision gently with soap and water.  Call office with any drainage, redness, or firmness of the incision.   02/27/19 2024         Outpatient follow up:  Follow-up Information    Dalia Heading, CNM. Schedule an appointment as soon as possible for a visit.   Specialty: Certified Nurse Midwife Why: for blood pressure check in 2-4 days Contact information: [address] [place] [address] [phone]  Postpartum contraception: undecided, considering pills  Discharged Condition: good  Discharged to: home   Newborn Data: Ezekiel Disposition:home with mother  Apgars: APGAR (1 MIN): 9   APGAR (5 MINS): 10   APGAR (10 MINS):    Baby Feeding: Breast  Natale Milch, MD 02/27/2019 8:25 PM

## 2019-02-26 NOTE — Progress Notes (Signed)
L&D progress Note  S: Not feeling contractions after epidural, but does feel pressure and was uncomfortable with foley catheter insertion in bladder. Does have a good level of anesthesia on her abdomen.  O:  Vital signs: BP 130/82   Pulse 79   Temp 98 F (36.7 C)   Resp 16   Ht 6' (1.829 m)   Wt (!) 159.2 kg   LMP 05/05/2018 (LMP Unknown)   SpO2 97%   BMI 47.60 kg/m    Foley bulb fell out at 0545 this AM. Cervix was 3/60%/-1 at that time. Obtained epidural in preparation for AROM. Pitocin was discontinued until after the epidural was placed. And restarted at 2 miu/min  AROM for moderate to large clear amniotic fluid. IUPC and FSE placed due to difficulty picking up contractions and keeping up with FHR with externals.  FHR: 125-130 baseline with accelerations to 150s to 160s, moderate variability Contractions: Contractions every 4-5 minutes with Pitocin at 6 miu/min Mvus: <100  A: IOL in progress FWB: Cat1  P: Titrate Pitocin to get 200 mvus Continue to monitor fetal maternal well being.  Farrel Conners, CNM

## 2019-02-27 LAB — CBC
HCT: 31.8 % — ABNORMAL LOW (ref 36.0–46.0)
Hemoglobin: 10.7 g/dL — ABNORMAL LOW (ref 12.0–15.0)
MCH: 30.8 pg (ref 26.0–34.0)
MCHC: 33.6 g/dL (ref 30.0–36.0)
MCV: 91.6 fL (ref 80.0–100.0)
Platelets: 196 10*3/uL (ref 150–400)
RBC: 3.47 MIL/uL — ABNORMAL LOW (ref 3.87–5.11)
RDW: 12.3 % (ref 11.5–15.5)
WBC: 22.6 10*3/uL — ABNORMAL HIGH (ref 4.0–10.5)
nRBC: 0 % (ref 0.0–0.2)

## 2019-02-27 MED ORDER — DOCUSATE SODIUM 100 MG PO CAPS: 100.0000 mg | ORAL_CAPSULE | Freq: Two times a day (BID) | ORAL | 0 refills | Status: DC

## 2019-02-27 MED ORDER — IBUPROFEN 600 MG PO TABS: 600.0000 mg | ORAL_TABLET | Freq: Four times a day (QID) | ORAL | 0 refills | Status: DC

## 2019-02-27 MED ORDER — FERROUS SULFATE 325 (65 FE) MG PO TABS: 325.0000 mg | ORAL_TABLET | Freq: Every day | ORAL | 0 refills | Status: DC

## 2019-02-27 NOTE — Discharge Instructions (Signed)
Vaginal Delivery, Care After Refer to this sheet in the next few weeks. These discharge instructions provide you with information on caring for yourself after delivery. Your caregiver may also give you specific instructions. Your treatment has been planned according to the most current medical practices available, but problems sometimes occur. Call your caregiver if you have any problems or questions after you go home. HOME CARE INSTRUCTIONS 1. Take over-the-counter or prescription medicines only as directed by your caregiver or pharmacist. 2. Do not drink alcohol, especially if you are breastfeeding or taking medicine to relieve pain. 3. Do not smoke tobacco. 4. Continue to use good perineal care. Good perineal care includes: 1. Wiping your perineum from back to front 2. Keeping your perineum clean. 3. You can do sitz baths twice a day, to help keep this area clean 5. Do not use tampons, douche or have sex for 6 weeks 6. Shower only and avoid sitting in submerged water, aside from sitz baths 7. Wear a well-fitting bra that provides breast support. 8. Eat healthy foods. 9. Drink enough fluids to keep your urine clear or pale yellow. 10. Eat high-fiber foods such as whole grain cereals and breads, brown rice, beans, and fresh fruits and vegetables every day. These foods may help prevent or relieve constipation. 11. Avoid constipation with high fiber foods or medications, such as miralax or metamucil 12. Follow your caregiver's recommendations regarding resumption of activities such as climbing stairs, driving, lifting, exercising, or traveling. 42. Talk to your caregiver about resuming sexual activities. Resumption of sexual activities after 6 weeks is dependent upon your risk of infection, your rate of healing, and your comfort and desire to resume sexual activity. 14. Try to have someone help you with your household activities and your newborn for at least a few days after you leave the  hospital. 15. Rest as much as possible. Try to rest or take a nap when your newborn is sleeping. 16. Increase your activities gradually. 70. Keep all of your scheduled postpartum appointments. It is very important to keep your scheduled follow-up appointments. At these appointments, your caregiver will be checking to make sure that you are healing physically and emotionally. SEEK MEDICAL CARE IF:   You are passing large clots from your vagina. Save any clots to show your caregiver.  You have a foul smelling discharge from your vagina.  You have trouble urinating.  You are urinating frequently.  You have pain when you urinate.  You have a change in your bowel movements.  You have increasing redness, pain, or swelling near your vaginal incision (episiotomy) or vaginal tear.  You have pus draining from your episiotomy or vaginal tear.  Your episiotomy or vaginal tear is separating.  You have painful, hard, or reddened breasts.  You have a severe headache.  You have blurred vision or see spots.  You feel sad or depressed.  You have thoughts of hurting yourself or your newborn.  You have questions about your care, the care of your newborn, or medicines.  You are dizzy or light-headed.  You have a rash.  You have nausea or vomiting.  You were breastfeeding and have not had a menstrual period within 12 weeks after you stopped breastfeeding.  You are not breastfeeding and have not had a menstrual period by the 12th week after delivery.  You have a fever of 100.5 or more SEEK IMMEDIATE MEDICAL CARE IF:   You have persistent pain.  You have chest pain.  You have shortness  of breath.  You faint.  You have leg pain.  You have stomach pain.  Your vaginal bleeding saturates two or more sanitary pads in 1 hour. MAKE SURE YOU:   Understand these instructions.  Will watch your condition.  Will get help right away if you are not doing well or get worse. Document  Released: 01/03/2000 Document Revised: 05/22/2013 Document Reviewed: 09/02/2011 [facility] Patient Information 2015 [employer], [place]. This information is not intended to replace advice given to you by your health care provider. Make sure you discuss any questions you have with your health care provider.  Sitz Bath A sitz bath is a warm water bath taken in the sitting position. The water covers only the hips and butt (buttocks). We recommend using one that fits in the toilet, to help with ease of use and cleanliness. It may be used for either healing or cleaning purposes. Sitz baths are also used to relieve pain, itching, or muscle tightening (spasms). The water may contain medicine. Moist heat will help you heal and relax.  HOME CARE  Take 3 to 4 sitz baths a day. 18. Fill the bathtub half-full with warm water. 19. Sit in the water and open the drain a little. 20. Turn on the warm water to keep the tub half-full. Keep the water running constantly. 21. Soak in the water for 15 to 20 minutes. 22. After the sitz bath, pat the affected area dry. GET HELP RIGHT AWAY IF: You get worse instead of better. Stop the sitz baths if you get worse. MAKE SURE YOU:  Understand these instructions.  Will watch your condition.  Will get help right away if you are not doing well or get worse. Document Released: 02/13/2004 Document Revised: 09/30/2011 Document Reviewed: 05/05/2010 [facility] Patient Information 2015 [place], [place]. This information is not intended to replace advice given to you by your health care provider. Make sure you discuss any questions you have with your health care provider.   Hypertension During Pregnancy Hypertension is also called high blood pressure. High blood pressure means that the force of your blood moving in your body is too strong. It can cause problems for you and your baby. Different types of high blood pressure can happen during pregnancy. The types are:  High blood pressure  before you got pregnant. This is called chronic hypertension.  This can continue during your pregnancy. Your doctor will want to keep checking your blood pressure. You may need medicine to keep your blood pressure under control while you are pregnant. You will need follow-up visits after you have your baby.  High blood pressure that goes up during pregnancy when it was normal before. This is called gestational hypertension. It will usually get better after you have your baby, but your doctor will need to watch your blood pressure to make sure that it is getting better.  Very high blood pressure during pregnancy. This is called preeclampsia. Very high blood pressure is an emergency that needs to be checked and treated right away.  You may develop very high blood pressure after giving birth. This is called postpartum preeclampsia. This usually occurs within 48 hours after childbirth but may occur up to 6 weeks after giving birth. This is rare. How does this affect me? If you have high blood pressure during pregnancy, you have a higher chance of developing high blood pressure:  As you get older.  If you get pregnant again. In some cases, high blood pressure during pregnancy can cause:  Stroke.  Heart attack.  Damage to the kidneys, lungs, or liver.  Preeclampsia.  Jerky movements you cannot control (convulsions or seizures).  Problems with the placenta. How does this affect my baby? Your baby may:  Be born early.  Not weigh as much as he or she should.  Not handle labor well, leading to a c-section birth. What are the risks?  Having high blood pressure during a past pregnancy.  Being overweight.  Being 21 years old or older.  Being pregnant for the first time.  Being pregnant with more than one baby.  Becoming pregnant using fertility methods, such as IVF.  Having other problems, such as diabetes, or kidney disease.  Having family members who have high blood  pressure. What can I do to lower my risk?   Keep a healthy weight.  Eat a healthy diet.  Follow what your doctor tells you about treating any medical problems that you had before becoming pregnant. It is very important to go to all of your doctor visits. Your doctor will check your blood pressure and make sure that your pregnancy is progressing as it should. Treatment should start early if a problem is found. How is this treated? Treatment for high blood pressure during pregnancy can differ depending on the type of high blood pressure you have and how serious it is.  You may need to take blood pressure medicine.  If you have been taking medicine for your blood pressure, you may need to change the medicine during pregnancy if it is not safe for your baby.  If your doctor thinks that you could get very high blood pressure, he or she may tell you to take a low-dose aspirin during your pregnancy.  If you have very high blood pressure, you may need to stay in the hospital so you and your baby can be watched closely. You may also need to take medicine to lower your blood pressure. This medicine may be given by mouth or through an IV tube.  In some cases, if your condition gets worse, you may need to have your baby early. Follow these instructions at home: Eating and drinking   Drink enough fluid to keep your pee (urine) pale yellow.  Avoid caffeine. Lifestyle  Do not use any products that contain nicotine or tobacco, such as cigarettes, e-cigarettes, and chewing tobacco. If you need help quitting, ask your doctor.  Do not use alcohol or drugs.  Avoid stress.  Rest and get plenty of sleep.  Regular exercise can help. Ask your doctor what kinds of exercise are best for you. General instructions  Take over-the-counter and prescription medicines only as told by your doctor.  Keep all prenatal and follow-up visits as told by your doctor. This is important. Contact a doctor  if:  You have symptoms that your doctor told you to watch for, such as: ? Headaches. ? Nausea. ? Vomiting. ? Belly (abdominal) pain. ? Dizziness. ? Light-headedness. Get help right away if:  You have: ? Very bad belly pain that does not get better with treatment. ? A very bad headache that does not get better. ? Vomiting that does not get better. ? Sudden, fast weight gain. ? Sudden swelling in your hands, ankles, or face. ? Bleeding from your vagina. ? Blood in your pee. ? Blurry vision. ? Double vision. ? Shortness of breath. ? Chest pain. ? Weakness on one side of your body. ? Trouble talking.  Your baby is not moving as much as usual.  Summary  High blood pressure is also called hypertension.  High blood pressure means that the force of your blood moving in your body is too strong.  High blood pressure can cause problems for you and your baby.  Keep all follow-up visits as told by your doctor. This is important. This information is not intended to replace advice given to you by your health care provider. Make sure you discuss any questions you have with your health care provider. Document Revised: 04/28/2018 Document Reviewed: 02/01/2018 Elsevier Patient Education  [place].   Breastfeeding  Choosing to breastfeed is one of the best decisions you can make for yourself and your baby. A change in hormones during pregnancy causes your breasts to make breast milk in your milk-producing glands. Hormones prevent breast milk from being released before your baby is born. They also prompt milk flow after birth. Once breastfeeding has begun, thoughts of your baby, as well as his or her sucking or crying, can stimulate the release of milk from your milk-producing glands. Benefits of breastfeeding Research shows that breastfeeding offers many health benefits for infants and mothers. It also offers a cost-free and convenient way to feed your baby. For your baby  Your  first milk (colostrum) helps your baby's digestive system to function better.  Special cells in your milk (antibodies) help your baby to fight off infections.  Breastfed babies are less likely to develop asthma, allergies, obesity, or type 2 diabetes. They are also at lower risk for sudden infant death syndrome (SIDS).  Nutrients in breast milk are better able to meet your baby's needs compared to infant formula.  Breast milk improves your baby's brain development. For you  Breastfeeding helps to create a very special bond between you and your baby.  Breastfeeding is convenient. Breast milk costs nothing and is always available at the correct temperature.  Breastfeeding helps to burn calories. It helps you to lose the weight that you gained during pregnancy.  Breastfeeding makes your uterus return faster to its size before pregnancy. It also slows bleeding (lochia) after you give birth.  Breastfeeding helps to lower your risk of developing type 2 diabetes, osteoporosis, rheumatoid arthritis, cardiovascular disease, and breast, ovarian, uterine, and endometrial cancer later in life. Breastfeeding basics Starting breastfeeding  Find a comfortable place to sit or lie down, with your neck and back well-supported.  Place a pillow or a rolled-up blanket under your baby to bring him or her to the level of your breast (if you are seated). Nursing pillows are specially designed to help support your arms and your baby while you breastfeed.  Make sure that your baby's tummy (abdomen) is facing your abdomen.  Gently massage your breast. With your fingertips, massage from the outer edges of your breast inward toward the nipple. This encourages milk flow. If your milk flows slowly, you may need to continue this action during the feeding.  Support your breast with 4 fingers underneath and your thumb above your nipple (make the letter "C" with your hand). Make sure your fingers are well away from your  nipple and your baby's mouth.  Stroke your baby's lips gently with your finger or nipple.  When your baby's mouth is open wide enough, quickly bring your baby to your breast, placing your entire nipple and as much of the areola as possible into your baby's mouth. The areola is the colored area around your nipple. ? More areola should be visible above your baby's upper lip than below  the lower lip. ? Your baby's lips should be opened and extended outward (flanged) to ensure an adequate, comfortable latch. ? Your baby's tongue should be between his or her lower gum and your breast.  Make sure that your baby's mouth is correctly positioned around your nipple (latched). Your baby's lips should create a seal on your breast and be turned out (everted).  It is common for your baby to suck about 2-3 minutes in order to start the flow of breast milk. Latching Teaching your baby how to latch onto your breast properly is very important. An improper latch can cause nipple pain, decreased milk supply, and poor weight gain in your baby. Also, if your baby is not latched onto your nipple properly, he or she may swallow some air during feeding. This can make your baby fussy. Burping your baby when you switch breasts during the feeding can help to get rid of the air. However, teaching your baby to latch on properly is still the best way to prevent fussiness from swallowing air while breastfeeding. Signs that your baby has successfully latched onto your nipple  Silent tugging or silent sucking, without causing you pain. Infant's lips should be extended outward (flanged).  Swallowing heard between every 3-4 sucks once your milk has started to flow (after your let-down milk reflex occurs).  Muscle movement above and in front of his or her ears while sucking. Signs that your baby has not successfully latched onto your nipple  Sucking sounds or smacking sounds from your baby while breastfeeding.  Nipple pain. If  you think your baby has not latched on correctly, slip your finger into the corner of your baby's mouth to break the suction and place it between your baby's gums. Attempt to start breastfeeding again. Signs of successful breastfeeding Signs from your baby  Your baby will gradually decrease the number of sucks or will completely stop sucking.  Your baby will fall asleep.  Your baby's body will relax.  Your baby will retain a small amount of milk in his or her mouth.  Your baby will let go of your breast by himself or herself. Signs from you  Breasts that have increased in firmness, weight, and size 1-3 hours after feeding.  Breasts that are softer immediately after breastfeeding.  Increased milk volume, as well as a change in milk consistency and color by the fifth day of breastfeeding.  Nipples that are not sore, cracked, or bleeding. Signs that your baby is getting enough milk  Wetting at least 1-2 diapers during the first 24 hours after birth.  Wetting at least 5-6 diapers every 24 hours for the first week after birth. The urine should be clear or pale yellow by the age of 5 days.  Wetting 6-8 diapers every 24 hours as your baby continues to grow and develop.  At least 3 stools in a 24-hour period by the age of 5 days. The stool should be soft and yellow.  At least 3 stools in a 24-hour period by the age of 7 days. The stool should be seedy and yellow.  No loss of weight greater than 10% of birth weight during the first 3 days of life.  Average weight gain of 4-7 oz (113-198 g) per week after the age of 4 days.  Consistent daily weight gain by the age of 5 days, without weight loss after the age of 2 weeks. After a feeding, your baby may spit up a small amount of milk. This is  normal. Breastfeeding frequency and duration Frequent feeding will help you make more milk and can prevent sore nipples and extremely full breasts (breast engorgement). Breastfeed when you feel the  need to reduce the fullness of your breasts or when your baby shows signs of hunger. This is called "breastfeeding on demand." Signs that your baby is hungry include:  Increased alertness, activity, or restlessness.  Movement of the head from side to side.  Opening of the mouth when the corner of the mouth or cheek is stroked (rooting).  Increased sucking sounds, smacking lips, cooing, sighing, or squeaking.  Hand-to-mouth movements and sucking on fingers or hands.  Fussing or crying. Avoid introducing a pacifier to your baby in the first 4-6 weeks after your baby is born. After this time, you may choose to use a pacifier. Research has shown that pacifier use during the first year of a baby's life decreases the risk of sudden infant death syndrome (SIDS). Allow your baby to feed on each breast as long as he or she wants. When your baby unlatches or falls asleep while feeding from the first breast, offer the second breast. Because newborns are often sleepy in the first few weeks of life, you may need to awaken your baby to get him or her to feed. Breastfeeding times will vary from baby to baby. However, the following rules can serve as a guide to help you make sure that your baby is properly fed:  Newborns (babies 684 weeks of age or younger) may breastfeed every 1-3 hours.  Newborns should not go without breastfeeding for longer than 3 hours during the day or 5 hours during the night.  You should breastfeed your baby a minimum of 8 times in a 24-hour period. Breast milk pumping     Pumping and storing breast milk allows you to make sure that your baby is exclusively fed your breast milk, even at times when you are unable to breastfeed. This is especially important if you go back to work while you are still breastfeeding, or if you are not able to be present during feedings. Your lactation consultant can help you find a method of pumping that works best for you and give you guidelines about  how long it is safe to store breast milk. Caring for your breasts while you breastfeed Nipples can become dry, cracked, and sore while breastfeeding. The following recommendations can help keep your breasts moisturized and healthy:  Avoid using soap on your nipples.  Wear a supportive bra designed especially for nursing. Avoid wearing underwire-style bras or extremely tight bras (sports bras).  Air-dry your nipples for 3-4 minutes after each feeding.  Use only cotton bra pads to absorb leaked breast milk. Leaking of breast milk between feedings is normal.  Use lanolin on your nipples after breastfeeding. Lanolin helps to maintain your skin's normal moisture barrier. Pure lanolin is not harmful (not toxic) to your baby. You may also hand express a few drops of breast milk and gently massage that milk into your nipples and allow the milk to air-dry. In the first few weeks after giving birth, some women experience breast engorgement. Engorgement can make your breasts feel heavy, warm, and tender to the touch. Engorgement peaks within 3-5 days after you give birth. The following recommendations can help to ease engorgement:  Completely empty your breasts while breastfeeding or pumping. You may want to start by applying warm, moist heat (in the shower or with warm, water-soaked hand towels) just before feeding or  pumping. This increases circulation and helps the milk flow. If your baby does not completely empty your breasts while breastfeeding, pump any extra milk after he or she is finished.  Apply ice packs to your breasts immediately after breastfeeding or pumping, unless this is too uncomfortable for you. To do this: ? Put ice in a plastic bag. ? Place a towel between your skin and the bag. ? Leave the ice on for 20 minutes, 2-3 times a day.  Make sure that your baby is latched on and positioned properly while breastfeeding. If engorgement persists after 48 hours of following these  recommendations, contact your health care provider or a Advertising [employer]. Overall health care recommendations while breastfeeding  Eat 3 healthy meals and 3 snacks every day. Well-nourished mothers who are breastfeeding need an additional 450-500 calories a day. You can meet this requirement by increasing the amount of a balanced diet that you eat.  Drink enough water to keep your urine pale yellow or clear.  Rest often, relax, and continue to take your prenatal vitamins to prevent fatigue, stress, and low vitamin and mineral levels in your body (nutrient deficiencies).  Do not use any products that contain nicotine or tobacco, such as cigarettes and e-cigarettes. Your baby may be harmed by chemicals from cigarettes that pass into breast milk and exposure to secondhand smoke. If you need help quitting, ask your health care provider.  Avoid alcohol.  Do not use illegal drugs or marijuana.  Talk with your health care provider before taking any medicines. These include over-the-counter and prescription medicines as well as vitamins and herbal supplements. Some medicines that may be harmful to your baby can pass through breast milk.  It is possible to become pregnant while breastfeeding. If birth control is desired, ask your health care provider about options that will be safe while breastfeeding your baby. Where to find more information: [employer]: www.llli.org Contact a health care provider if:  You feel like you want to stop breastfeeding or have become frustrated with breastfeeding.  Your nipples are cracked or bleeding.  Your breasts are red, tender, or warm.  You have: ? Painful breasts or nipples. ? A swollen area on either breast. ? A fever or chills. ? Nausea or vomiting. ? Drainage other than breast milk from your nipples.  Your breasts do not become full before feedings by the fifth day after you give birth.  You feel sad and depressed.  Your baby  is: ? Too sleepy to eat well. ? Having trouble sleeping. ? More than 9 week old and wetting fewer than 6 diapers in a 24-hour period. ? Not gaining weight by 20 days of age.  Your baby has fewer than 3 stools in a 24-hour period.  Your baby's skin or the white parts of his or her eyes become yellow. Get help right away if:  Your baby is overly tired (lethargic) and does not want to wake up and feed.  Your baby develops an unexplained fever. Summary  Breastfeeding offers many health benefits for infant and mothers.  Try to breastfeed your infant when he or she shows early signs of hunger.  Gently tickle or stroke your baby's lips with your finger or nipple to allow the baby to open his or her mouth. Bring the baby to your breast. Make sure that much of the areola is in your baby's mouth. Offer one side and burp the baby before you offer the other side.  Talk with  your health care provider or lactation consultant if you have questions or you face problems as you breastfeed. This information is not intended to replace advice given to you by your health care provider. Make sure you discuss any questions you have with your health care provider. Document Revised: 04/01/2017 Document Reviewed: 02/07/2016 Elsevier Patient Education  2020 [employer]

## 2019-02-27 NOTE — Progress Notes (Signed)
Subjective:  She is resting in bed. She is very tired from long induction.  Pain control is controlled. Voiding without difficulty. Tolerating a regular diet. Ambulating well. Desires early discharge home today if possible.  Objective:   Blood pressure (!) 113/52, pulse 82, temperature (!) 97.4 F (36.3 C), temperature source Oral, resp. rate 18, height 6' (1.829 m), weight (!) 159.2 kg, last menstrual period 05/05/2018, SpO2 100 %, unknown if currently breastfeeding.  General: NAD Pulmonary: no increased work of breathing Abdomen: non-distended, non-tender Uterus:  fundus firm at U; lochia normal Extremities: no edema, no erythema, no tenderness, no signs of DVT  Results for orders placed or performed during the hospital encounter of 02/24/19 (from the past 72 hour(s))  ROM Plus ([facility] only)     Status: None   Collection Time: 02/24/19 10:00 PM  Result Value Ref Range   Rom Plus NEGATIVE     Comment: Performed at [facility], [address] [place], [place] [address]  Protein / creatinine ratio, urine     Status: None   Collection Time: 02/24/19 10:00 PM  Result Value Ref Range   Creatinine, Urine 153 mg/dL   Total Protein, Urine 14 mg/dL    Comment: NO NORMAL RANGE ESTABLISHED FOR THIS TEST   Protein Creatinine Ratio 0.09 0.00 - 0.15 mg/mg[Cre]    Comment: Performed at [facility], [address] [place], [place] [address]  Comprehensive metabolic panel     Status: Abnormal   Collection Time: 02/24/19 10:16 PM  Result Value Ref Range   Sodium 137 135 - 145 mmol/L   Potassium 3.9 3.5 - 5.1 mmol/L   Chloride 104 98 - 111 mmol/L   CO2 22 22 - 32 mmol/L   Glucose, Bld 91 70 - 99 mg/dL   BUN 12 6 - 20 mg/dL   Creatinine, Ser 5.99 0.44 - 1.00 mg/dL   Calcium 9.5 8.9 - 35.7 mg/dL   Total Protein 6.9 6.5 - 8.1 g/dL   Albumin 3.3 (L) 3.5 - 5.0 g/dL   AST 20 15 - 41 U/L   ALT 21 0 - 44 U/L   Alkaline Phosphatase 193 (H) 38 - 126 U/L   Total Bilirubin 0.4  0.3 - 1.2 mg/dL   GFR calc non Af Amer >60 >60 mL/min   GFR calc Af Amer >60 >60 mL/min   Anion gap 11 5 - 15    Comment: Performed at [facility], [address] [place], [place] [address]  CBC on admission     Status: Abnormal   Collection Time: 02/24/19 10:16 PM  Result Value Ref Range   WBC 13.6 (H) 4.0 - 10.5 K/uL   RBC 3.96 3.87 - 5.11 MIL/uL   Hemoglobin 12.0 12.0 - 15.0 g/dL   HCT 39.0 30.0 - 92.3 %   MCV 91.7 80.0 - 100.0 fL   MCH 30.3 26.0 - 34.0 pg   MCHC 33.1 30.0 - 36.0 g/dL   RDW 30.0 76.2 - 26.3 %   Platelets 241 150 - 400 K/uL   nRBC 0.0 0.0 - 0.2 %    Comment: Performed at [facility], [address] [place], [place] [address]  Type and screen [facility]     Status: None   Collection Time: 02/24/19 10:48 PM  Result Value Ref Range   ABO/RH(D) O POS    Antibody Screen NEG    Sample Expiration      02/27/2019,2359 Performed at [facility], [place], [place] [address]   RPR     Status: None   Collection Time: 02/24/19 10:48 PM  Result Value Ref Range   RPR Ser Ql NON REACTIVE NON REACTIVE    Comment: Performed at [facility], [place] [address], [place], [place] [address]  Willis rt PCR [facility] only)     Status: None   Collection Time: 02/24/19 11:26 PM   Specimen: Urine  Result Value Ref Range   Specimen source GC/Chlam URINE, RANDOM    Chlamydia Tr NOT DETECTED NOT DETECTED   N gonorrhoeae NOT DETECTED NOT DETECTED    Comment: (NOTE) This CT/NG assay has not been evaluated in patients with a history of  hysterectomy. Performed at [facility], [address] [place], [place] [address]   CBC     Status: Abnormal   Collection Time: 02/27/19  5:23 AM  Result Value Ref Range   WBC 22.6 (H) 4.0 - 10.5 K/uL   RBC 3.47 (L) 3.87 - 5.11 MIL/uL   Hemoglobin 10.7 (L) 12.0 - 15.0 g/dL   HCT 31.8 (L) 36.0 - 46.0 %   MCV 91.6 80.0 - 100.0 fL   MCH 30.8 26.0 - 34.0 pg    MCHC 33.6 30.0 - 36.0 g/dL   RDW 12.3 11.5 - 15.5 %   Platelets 196 150 - 400 K/uL   nRBC 0.0 0.0 - 0.2 %    Comment: Performed at [facility], [address], [place], [place] [address]    Assessment:   24 y.o. G1P1001 postpartum day # 1  Plan:    1) Acute blood loss anemia - hemodynamically stable and asymptomatic - po ferrous sulfate  2) Blood Type --/--/O POS (02/05 2248)   3) Rubella 2.16 (12/16 1532) / Varicella Immune  4) TDAP up to date  4) Breast feeding- having some difficulty.   5) Contraception: unsure, considering a pill  6) Disposition: would like to be discharged today if possible. Understands discharge will be late because infant needs 24 hour testing.  Close office follow up for BP check discussed.

## 2019-02-27 NOTE — Anesthesia Postprocedure Evaluation (Signed)
Anesthesia Post Note  Patient: Claire Walker  Procedure(s) Performed: AN AD HOC LABOR EPIDURAL  Patient location during evaluation: Mother Baby Anesthesia Type: Epidural Level of consciousness: awake and alert Pain management: pain level controlled Vital Signs Assessment: post-procedure vital signs reviewed and stable Respiratory status: spontaneous breathing, nonlabored ventilation and respiratory function stable Cardiovascular status: stable Postop Assessment: no headache, no backache and epidural receding Anesthetic complications: no     Last Vitals:  Vitals:   02/27/19 0010 02/27/19 0315  BP: 127/70 (!) 131/59  Pulse: 92 99  Resp: 18 20  Temp: 36.4 C 36.5 C  SpO2: 98% 98%    Last Pain:  Vitals:   02/27/19 0343  TempSrc:   PainSc: 0-No pain                 Mathews Argyle P

## 2019-02-27 NOTE — Progress Notes (Signed)
All discharge instructions reviewed with pt; pt has copy of discharge instructions; pt discharged to home with significant other and new baby

## 2019-02-27 NOTE — Lactation Note (Addendum)
This note was copied from a baby's chart. Lactation Consultation Note  Patient Name: Claire Walker Date: 02/27/2019 Reason for consult: Initial assessment;Mother's request;Primapara;Term;Other (Comment)([name] is sleepy after circumcision)  [name] was circumcised less than 4 hours ago and is still sleepy from circumcision. When checked [name]'s mouth, tight frenulum noted.  Does not sem to be interfering with breast feeding presently.  Information sheet on area dentists that will evaluate and treat.  Encouraged mom to talk to her pediatrician about the tight frenulum. Woke baby with change of stool and void.  When put to breast, he fell back to sleep.  Assisted mom with positioning with pillow support with [name] in football hold on left breast.  He latched after several attempts but only took a few weak sucks.  Mom could feel tugging at the breast with no breast or nipple pain.  Mom has [employer] and has already gotten Evenflo DEBP.  Mom had late [facility] because she did not know she was pregnant until 32 weeks.  Explained feeding cues and encouraged mom to put [name] to the breast whenever he demonstrated hunger cues.    Discussed normal newborn stomach size, supply and demand, normal course of lactation and routine newborn feeding patterns.  Mom had questions about when and how to begin pumping which were addressed.  Lactation community resource hand out given with contact numbers and encouraged to call with any questions, concerns or assistance. Maternal Data Formula Feeding for Exclusion: No Has patient been taught Hand Expression?: Yes Does the patient have breastfeeding experience prior to this delivery?: No(Gr1)  Feeding Feeding Type: Breast Fed  LATCH Score Latch: Repeated attempts needed to sustain latch, nipple held in mouth throughout feeding, stimulation needed to elicit sucking reflex.  Audible Swallowing: A few with stimulation  Type of Nipple: Everted at rest and  after stimulation  Comfort (Breast/Nipple): Soft / non-tender  Hold (Positioning): Assistance needed to correctly position infant at breast and maintain latch.  LATCH Score: 7  Interventions Interventions: Breast feeding basics reviewed;Assisted with latch;Skin to skin;Breast massage;Hand express;Reverse pressure;Breast compression;Adjust position;Support pillows;Position options  Lactation Tools Discussed/Used WIC Program: No([employer]HC)   Consult Status Consult Status: Follow-up Date: 02/27/19 Follow-up type: Call as needed    Louis Meckel 02/27/2019, 2:24 PM

## 2019-04-06 NOTE — Telephone Encounter (Signed)
CLG did this delivery. I know she's out of the office. This patient has not scheduled a 6 week postpartum and delivered about 6 weeks ago.  I would assume she's OK. But, I can't say so without seeing her.  Someone else might be willing to do it.  I'm not sure why she sent this to AMS, unless she was going to see him or they had some other discussion that I wasn't privy to.

## 2019-04-06 NOTE — Progress Notes (Signed)
Postpartum Visit  Chief Complaint:  Chief Complaint  Patient presents with  . Postpartum Care    History of Present Illness: Avnoor Maddy is a 24 y.o. G1P1001 who presents for her 6 week postpartum visit.  Date of delivery: 02/26/2019 Type of delivery: Vaginal delivery - Vacuum or forceps assisted  no Episiotomy No.  Laceration: yes, vaginal and right and left labial  Pregnancy or labor problems:  Yes, gestational hypertension and morbid obesity. Late entry to care Any problems since the delivery:  Patient reports some postpartum blues "I was just so tired". Stopped breastfeeding/ pumping x 2 weeks and started feeding formula. Feels better now that she has gotten sleep at night. Wants to resume pumping milk and is going back to work on 22 March. DIscussed pumping every 3 hours or so.   Newborn Details:  SINGLETON :  1. Baby's name: Ezekiel. Birth weight: 7#9 oz Maternal Details:  Breast Feeding:  No, stopped pumping x 2 weeks, but wants to resume. Post partum depression/anxiety noted:  Some concern for anxiety Edinburgh Post-Partum Depression Score:  7  Date of last PAP: 12/2018  normal   Review of Systems: ROS  Past Medical History:  Past Medical History:  Diagnosis Date  . Gestational hypertension   . Morbid obesity with BMI of 45.0-49.9, adult (HCC) 04/07/2019    Past Surgical History:  Past Surgical History:  Procedure Laterality Date  . NO PAST SURGERIES      Family History:  Family History  Problem Relation Age of Onset  . Arthritis Mother   . Asthma Mother   . Hypertension Mother   . Thyroid disease Mother   . Diabetes Father   . Hypertension Maternal Grandmother   . Cancer Maternal Grandfather        squamous cell carcinoma  . Hypertension Maternal Grandfather   . Diabetes Maternal Grandfather   . Hypertension Paternal Grandmother   . Hypertension Paternal Grandfather     Social History:  Social History   Socioeconomic History  . Marital  status: Single    Spouse name: Not on file  . Number of children: 1  . Years of education: Not on file  . Highest education level: Not on file  Occupational History  . Occupation: [employer]  Tobacco Use  . Smoking status: Never Smoker  . Smokeless tobacco: Never Used  Substance and Sexual Activity  . Alcohol use: No    Alcohol/week: 0.0 standard drinks  . Drug use: No  . Sexual activity: Not Currently    Birth control/protection: None    Comment: Junel OCP  Other Topics Concern  . Not on file  Social History Narrative  . Not on file   Social Determinants of Health   Financial Resource Strain:   . Difficulty of Paying Living Expenses:   Food Insecurity:   . Worried About Programme researcher, broadcasting/film/video in the Last Year:   . Barista in the Last Year:   Transportation Needs:   . Freight forwarder (Medical):   Marland Kitchen Lack of Transportation (Non-Medical):   Physical Activity:   . Days of Exercise per Week:   . Minutes of Exercise per Session:   Stress:   . Feeling of Stress :   Social Connections:   . Frequency of Communication with Friends and Family:   . Frequency of Social Gatherings with Friends and Family:   . Attends Religious Services:   . Active Member of Clubs or Organizations:   .  Attends Archivist Meetings:   Marland Kitchen Marital Status:   Intimate Partner Violence:   . Fear of Current or Ex-Partner:   . Emotionally Abused:   Marland Kitchen Physically Abused:   . Sexually Abused:     Allergies:  No Known Allergies  Medications: Prior to Admission medications   Medication Sig Start Date End Date Taking? Authorizing Provider  acetaminophen (TYLENOL) 500 MG tablet Take 2 tablets (1,000 mg total) by mouth every 6 (six) hours as needed for headache. 02/15/19   Will Bonnet, MD  docusate sodium (COLACE) 100 MG capsule Take 1 capsule (100 mg total) by mouth 2 (two) times daily. 02/27/19 02/27/20  Homero Fellers, MD  ferrous sulfate 325 (65 FE) MG tablet Take 1 tablet (325  mg total) by mouth daily with breakfast. 02/28/19   Schuman, Christanna R, MD  ibuprofen (ADVIL) 600 MG tablet Take 1 tablet (600 mg total) by mouth every 6 (six) hours. 02/27/19   Schuman, Stefanie Libel, MD  Prenat w/o A-FeCbn-Meth-FA-DHA (PRENATE MINI) 29-0.6-0.4-350 MG CAPS Take 1 capsule by mouth daily. 01/25/19   Dalia Heading, CNM    Physical Exam Vitals: BP 120/60   Pulse 72   Ht 6' (1.829 m)   Wt (!) 342 lb (155.1 kg)   LMP  (LMP Unknown)   BMI 46.38 kg/m  General: WF in NAD HEENT: normocephalic, anicteric Neck: No thyroid enlargement, no palpable nodules, no cervical lymphadenpathy Breast: no inflammation, no masses, nipples intact. No axillary, supraclavicular, or infraclavicular LAN Heart: RRR without murmur Pulmonary: No increased work of breathing, CTAB Abdomen: Soft, non-tender, non-distended.  Umbilicus without lesions.  No hepatomegaly or masses palpable. No evidence of hernia. Genitourinary:  External: Well healed labia, no lesions or inflammation    Vagina: Normal vaginal mucosa, no evidence of prolapse.    Cervix: no bleeding, no masses  Uterus: Well involuted, mobile, non-tender. RV?-very difficult to outline due to body habitus  Adnexa: No adnexal masses, non-tender  Rectal: deferred Extremities: no edema, erythema, or tenderness Neurologic: Grossly intact Psychiatric: mood appropriate, affect full  Assessment: 24 y.o. G1P1001 presenting for 6 week postpartum visit  Plan:   1) Contraception Education given regarding options for contraception, including barrier methods, injectable contraception, IUD placement, oral contraceptives. Patient would like to use pills for contraception. Since she is wanting to pump breast milk, will start on POP and switch to [facility] when she has stopped breastfeeding. DIscussed how to take both of these pills. Reminded that if more than 3 hours late taking the POP she will need to use condoms x 2 days. RX for Errin to pharmacy.  Start pills today and use back up birth control x 1 week.  2)  Pap not done. Due after 12/2019  3) Patient underwent screening for postpartum depression with few concerns noted.  4) Discussed return to normal activity, recommend continuing prenatal vitamins. Given note to return to work.  5) Follow up 1 year for routine annual exam and prn  Dalia Heading, CNM

## 2019-04-07 ENCOUNTER — Encounter: Payer: Self-pay | Admitting: Certified Nurse Midwife

## 2019-04-07 ENCOUNTER — Other Ambulatory Visit: Payer: Self-pay

## 2019-04-07 ENCOUNTER — Ambulatory Visit (INDEPENDENT_AMBULATORY_CARE_PROVIDER_SITE_OTHER): Payer: Commercial Managed Care - PPO | Admitting: Certified Nurse Midwife

## 2019-04-07 DIAGNOSIS — Z6841 Body Mass Index (BMI) 40.0 and over, adult: Secondary | ICD-10-CM

## 2019-04-07 DIAGNOSIS — Z30011 Encounter for initial prescription of contraceptive pills: Secondary | ICD-10-CM

## 2019-04-07 HISTORY — DX: Body Mass Index (BMI) 40.0 and over, adult: Z684

## 2019-04-07 HISTORY — DX: Morbid (severe) obesity due to excess calories: E66.01

## 2019-04-07 MED ORDER — NORETHINDRONE 0.35 MG PO TABS: 1.0000 | ORAL_TABLET | Freq: Every day | ORAL | 11 refills | Status: DC

## 2019-06-01 ENCOUNTER — Other Ambulatory Visit: Payer: Self-pay | Admitting: Obstetrics and Gynecology

## 2019-06-26 ENCOUNTER — Encounter: Payer: Self-pay | Admitting: Family Medicine

## 2019-06-27 ENCOUNTER — Telehealth (INDEPENDENT_AMBULATORY_CARE_PROVIDER_SITE_OTHER): Payer: Commercial Managed Care - PPO | Admitting: Family Medicine

## 2019-06-27 ENCOUNTER — Encounter: Payer: Self-pay | Admitting: Family Medicine

## 2019-06-27 VITALS — Wt 340.0 lb

## 2019-06-27 DIAGNOSIS — F322 Major depressive disorder, single episode, severe without psychotic features: Secondary | ICD-10-CM | POA: Diagnosis not present

## 2019-06-27 DIAGNOSIS — Z6841 Body Mass Index (BMI) 40.0 and over, adult: Secondary | ICD-10-CM

## 2019-06-27 MED ORDER — NORETHIN ACE-ETH ESTRAD-FE 1-20 MG-MCG PO TABS: 1.0000 | ORAL_TABLET | Freq: Every day | ORAL | 3 refills | Status: DC

## 2019-06-27 MED ORDER — CONTRAVE 8-90 MG PO TB12: ORAL_TABLET | ORAL | 0 refills | Status: DC

## 2019-06-27 MED ORDER — SERTRALINE HCL 50 MG PO TABS: 50.0000 mg | ORAL_TABLET | Freq: Every day | ORAL | 0 refills | Status: DC

## 2019-06-27 NOTE — Progress Notes (Signed)
Wt (!) 340 lb (154.2 kg)    BMI 46.11 kg/m    Subjective:    Patient ID: Claire Walker, female    DOB: 12-13-1995, 24 y.o.   MRN: [id number]  HPI: Claire Walker is a 24 y.o. female  Chief Complaint  Patient presents with   Depression     This visit was completed via MyChart due to the restrictions of the COVID-19 pandemic. All issues as above were discussed and addressed. Physical exam was done as above through visual confirmation on MyChart. If it was felt that the patient should be evaluated in the office, they were directed there. The patient verbally consented to this visit.  Location of the patient: work  Location of the provider: work  Those involved with this call:   Provider: Merrie Roof, PA-C  CMA: Lesle Chris, [place] Desk/Registration: Jill Side   Time spent on call: 25 minutes with patient face to face via video conference. More than 50% of this time was spent in counseling and coordination of care. 5 minutes total spent in review of patient's record and preparation of their chart. I verified patient identity using two factors (patient name and date of birth). Patient consents verbally to being seen via telemedicine visit today.   Presenting today with post-partum mood concerns. Having good days and bad days with her moods, many days feeling lacking motivation, anhedonia, sadness. This started happening after having her son several months ago, crying spells for no identifiable reason, falling behind in chores around the house, not wanting to get out of bed or to go to work. Has struggled off and on with depression in the past and has tried celexa and wellbutrin without significant relief. Denies SI/HI.   Also very concerned about her weight. Requesting something to help jump start her journey to weight loss. Has never tried weight loss medication in the past. Does not exercise or watch diet.   Depression screen [facility] 2/9 06/27/2019 06/15/2018 04/27/2017  Decreased  Interest 3 1 0  Down, Depressed, Hopeless 3 1 0  PHQ - 2 Score 6 2 0  Altered sleeping 3 1 0  Tired, decreased energy 3 1 0  Change in appetite 3 3 0  Feeling bad or failure about yourself  3 1 0  Trouble concentrating 3 0 0  Moving slowly or fidgety/restless 3 0 0  Suicidal thoughts 0 0 0  PHQ-9 Score 24 8 0  Difficult doing work/chores Extremely dIfficult Not difficult at all -   GAD 7 : Generalized Anxiety Score 06/27/2019 06/15/2018  Nervous, Anxious, on Edge 3 0  Control/stop worrying 3 1  Worry too much - different things 3 1  Trouble relaxing 3 1  Restless 0 1  Easily annoyed or irritable 3 0  Afraid - awful might happen 2 0  Total GAD 7 Score 17 4  Anxiety Difficulty Extremely difficult Not difficult at all   Relevant past medical, surgical, family and social history reviewed and updated as indicated. Interim medical history since our last visit reviewed. Allergies and medications reviewed and updated.  Review of Systems  Per HPI unless specifically indicated above     Objective:    Wt (!) 340 lb (154.2 kg)    BMI 46.11 kg/m   Wt Readings from Last 3 Encounters:  06/27/19 (!) 340 lb (154.2 kg)  04/07/19 (!) 342 lb (155.1 kg)  02/24/19 (!) 350 lb 15.6 oz (159.2 kg)    Physical Exam Vitals and nursing  note reviewed.  Constitutional:      General: She is not in acute distress.    Appearance: Normal appearance.  HENT:     Head: Atraumatic.     Right Ear: External ear normal.     Left Ear: External ear normal.     Nose: Nose normal. No congestion.     Mouth/Throat:     Mouth: Mucous membranes are moist.     Pharynx: Oropharynx is clear. No posterior oropharyngeal erythema.  Eyes:     Extraocular Movements: Extraocular movements intact.     Conjunctiva/sclera: Conjunctivae normal.  Cardiovascular:     Comments: Unable to assess via virtual visit Pulmonary:     Effort: Pulmonary effort is normal. No respiratory distress.  Musculoskeletal:        General:  Normal range of motion.     Cervical back: Normal range of motion.  Skin:    General: Skin is dry.     Findings: No erythema.  Neurological:     Mental Status: She is alert and oriented to person, place, and time.  Psychiatric:        Mood and Affect: Mood normal.        Thought Content: Thought content normal.        Judgment: Judgment normal.     Results for orders placed or performed during the hospital encounter of 02/24/19  Chlamydia/NGC rt PCR ([facility] only)   Specimen: Urine  Result Value Ref Range   Specimen source GC/Chlam URINE, RANDOM    Chlamydia Tr NOT DETECTED NOT DETECTED   N gonorrhoeae NOT DETECTED NOT DETECTED  OB RESULT CONSOLE Group B Strep  Result Value Ref Range   GBS Negative   Comprehensive metabolic panel  Result Value Ref Range   Sodium 137 135 - 145 mmol/L   Potassium 3.9 3.5 - 5.1 mmol/L   Chloride 104 98 - 111 mmol/L   CO2 22 22 - 32 mmol/L   Glucose, Bld 91 70 - 99 mg/dL   BUN 12 6 - 20 mg/dL   Creatinine, Ser 4.65 0.44 - 1.00 mg/dL   Calcium 9.5 8.9 - 03.5 mg/dL   Total Protein 6.9 6.5 - 8.1 g/dL   Albumin 3.3 (L) 3.5 - 5.0 g/dL   AST 20 15 - 41 U/L   ALT 21 0 - 44 U/L   Alkaline Phosphatase 193 (H) 38 - 126 U/L   Total Bilirubin 0.4 0.3 - 1.2 mg/dL   GFR calc non Af Amer >60 >60 mL/min   GFR calc Af Amer >60 >60 mL/min   Anion gap 11 5 - 15  CBC on admission  Result Value Ref Range   WBC 13.6 (H) 4.0 - 10.5 K/uL   RBC 3.96 3.87 - 5.11 MIL/uL   Hemoglobin 12.0 12.0 - 15.0 g/dL   HCT 46.5 36 - 46 %   MCV 91.7 80.0 - 100.0 fL   MCH 30.3 26.0 - 34.0 pg   MCHC 33.1 30.0 - 36.0 g/dL   RDW 68.1 27.5 - 17.0 %   Platelets 241 150 - 400 K/uL   nRBC 0.0 0.0 - 0.2 %  ROM Plus ([facility] only)  Result Value Ref Range   Rom Plus NEGATIVE   Protein / creatinine ratio, urine  Result Value Ref Range   Creatinine, Urine 153 mg/dL   Total Protein, Urine 14 mg/dL   Protein Creatinine Ratio 0.09 0.00 - 0.15 mg/mg[Cre]  RPR  Result Value Ref Range    RPR  Ser Ql NON REACTIVE NON REACTIVE  OB RESULTS CONSOLE Varicella zoster antibody, IgG  Result Value Ref Range   Varicella Immune   OB RESULTS CONSOLE GC/Chlamydia  Result Value Ref Range   Gonorrhea Negative   CBC  Result Value Ref Range   WBC 22.6 (H) 4.0 - 10.5 K/uL   RBC 3.47 (L) 3.87 - 5.11 MIL/uL   Hemoglobin 10.7 (L) 12.0 - 15.0 g/dL   HCT 73.2 (L) 36 - 46 %   MCV 91.6 80.0 - 100.0 fL   MCH 30.8 26.0 - 34.0 pg   MCHC 33.6 30.0 - 36.0 g/dL   RDW 20.2 54.2 - 70.6 %   Platelets 196 150 - 400 K/uL   nRBC 0.0 0.0 - 0.2 %  Type and screen [facility]  Result Value Ref Range   ABO/RH(D) O POS    Antibody Screen NEG    Sample Expiration      02/27/2019,2359 Performed at [facility], [address] [place], [place] [address]       Assessment & Plan:   Problem List Items Addressed This Visit      Other   Depression, major, single episode, severe (HCC) - Primary    Start zoloft, recommended counseling.       Relevant Medications   sertraline (ZOLOFT) 50 MG tablet   BMI 45.0-49.9, adult (HCC)    Agreeable to trying contrave, will monitor closely with f/u in 1 month. Diet and exercise strategies reviewed      Relevant Medications   Naltrexone-buPROPion HCl ER (CONTRAVE) 8-90 MG TB12       Follow up plan: Return in about 4 weeks (around 07/25/2019) for Mood and weight f/u.

## 2019-06-28 ENCOUNTER — Encounter: Payer: Self-pay | Admitting: Family Medicine

## 2019-07-05 NOTE — Assessment & Plan Note (Signed)
Agreeable to trying contrave, will monitor closely with f/u in 1 month. Diet and exercise strategies reviewed

## 2019-07-05 NOTE — Assessment & Plan Note (Signed)
Start zoloft, recommended counseling.

## 2019-07-13 ENCOUNTER — Encounter: Payer: Commercial Managed Care - PPO | Admitting: Family Medicine

## 2019-07-21 ENCOUNTER — Other Ambulatory Visit: Payer: Self-pay | Admitting: Family Medicine

## 2019-08-03 ENCOUNTER — Telehealth (INDEPENDENT_AMBULATORY_CARE_PROVIDER_SITE_OTHER): Payer: Commercial Managed Care - PPO | Admitting: Family Medicine

## 2019-08-03 ENCOUNTER — Encounter: Payer: Self-pay | Admitting: Family Medicine

## 2019-08-03 VITALS — Wt 350.0 lb

## 2019-08-03 DIAGNOSIS — F322 Major depressive disorder, single episode, severe without psychotic features: Secondary | ICD-10-CM

## 2019-08-03 NOTE — Progress Notes (Signed)
Tried to reach out via mychart video visit around 345 pm but was unable to connect. Tried all 3 numbers associated with her account, two did not ring and one went straight to VM. Called back twice since then, and the most recent time left a VM to return call to office to discuss rescheduling or going forward with the visit.

## 2019-08-07 ENCOUNTER — Telehealth (INDEPENDENT_AMBULATORY_CARE_PROVIDER_SITE_OTHER): Payer: Commercial Managed Care - PPO | Admitting: Family Medicine

## 2019-08-07 ENCOUNTER — Encounter: Payer: Self-pay | Admitting: Family Medicine

## 2019-08-07 VITALS — Wt 340.0 lb

## 2019-08-07 DIAGNOSIS — F322 Major depressive disorder, single episode, severe without psychotic features: Secondary | ICD-10-CM

## 2019-08-07 MED ORDER — SERTRALINE HCL 100 MG PO TABS: 100.0000 mg | ORAL_TABLET | Freq: Every day | ORAL | 0 refills | Status: DC

## 2019-08-07 NOTE — Progress Notes (Signed)
Wt (!) 340 lb (154.2 kg)   BMI 46.11 kg/m    Subjective:    Patient ID: Claire Walker, female    DOB: [birth year], 24 y.o.   MRN: [id number]  HPI: Claire Walker is a 24 y.o. female  Chief Complaint  Patient presents with  . Depression    . This visit was completed via MyChart due to the restrictions of the COVID-19 pandemic. All issues as above were discussed and addressed. Physical exam was done as above through visual confirmation on MyChart. If it was felt that the patient should be evaluated in the office, they were directed there. The patient verbally consented to this visit. . Location of the patient: home . Location of the provider: work . Those involved with this call:  . Provider: Roosvelt Maser, PA-C . CMA: Elton Sin, CMA . Front Desk/Registration: Adela Ports  . Time spent on call: 30 minutes with patient face to face via video conference. More than 50% of this time was spent in counseling and coordination of care. 10 minutes total spent in review of patient's record and preparation of their chart. I verified patient identity using two factors (patient name and date of birth). Patient consents verbally to being seen via telemedicine visit today.   Presenting today following up on starting zoloft for post-partum depression. Does feel some improvement since starting and tolerating things well. Still having some days where she feels very unmotivated, wanting to sleep all day, feeling inadequate and overwhelmed. Denies SI/HI. Planning to start counseling through work and is requesting an FMLA leave of 2 weeks starting this past Wednesday, July 14th and to return to work Thursday, July 29th.   Depression screen [facility] 2/9 08/03/2019 06/27/2019 06/15/2018  Decreased Interest 3 3 1   Down, Depressed, Hopeless 3 3 1   PHQ - 2 Score 6 6 2   Altered sleeping 3 3 1   Tired, decreased energy 3 3 1   Change in appetite 1 3 3   Feeling bad or failure about yourself  3 3 1   Trouble  concentrating 3 3 0  Moving slowly or fidgety/restless 3 3 0  Suicidal thoughts 0 0 0  PHQ-9 Score 22 24 8   Difficult doing work/chores Extremely dIfficult Extremely dIfficult Not difficult at all   GAD 7 : Generalized Anxiety Score 06/27/2019 06/15/2018  Nervous, Anxious, on Edge 3 0  Control/stop worrying 3 1  Worry too much - different things 3 1  Trouble relaxing 3 1  Restless 0 1  Easily annoyed or irritable 3 0  Afraid - awful might happen 2 0  Total GAD 7 Score 17 4  Anxiety Difficulty Extremely difficult Not difficult at all   Relevant past medical, surgical, family and social history reviewed and updated as indicated. Interim medical history since our last visit reviewed. Allergies and medications reviewed and updated.  Review of Systems  Per HPI unless specifically indicated above     Objective:    Wt (!) 340 lb (154.2 kg)   BMI 46.11 kg/m   Wt Readings from Last 3 Encounters:  08/07/19 (!) 340 lb (154.2 kg)  08/03/19 (!) 350 lb (158.8 kg)  06/27/19 (!) 340 lb (154.2 kg)    Physical Exam Vitals and nursing note reviewed.  Constitutional:      General: She is not in acute distress.    Appearance: Normal appearance.  HENT:     Head: Atraumatic.     Right Ear: External ear normal.     Left Ear: External  ear normal.     Nose: Nose normal. No congestion.     Mouth/Throat:     Mouth: Mucous membranes are moist.     Pharynx: Oropharynx is clear. No posterior oropharyngeal erythema.  Eyes:     Extraocular Movements: Extraocular movements intact.     Conjunctiva/sclera: Conjunctivae normal.  Cardiovascular:     Comments: Unable to assess via virtual visit Pulmonary:     Effort: Pulmonary effort is normal. No respiratory distress.  Musculoskeletal:        General: Normal range of motion.     Cervical back: Normal range of motion.  Skin:    General: Skin is dry.     Findings: No erythema.  Neurological:     Mental Status: She is alert and oriented to person,  place, and time.  Psychiatric:        Mood and Affect: Mood normal.        Thought Content: Thought content normal.        Judgment: Judgment normal.     Results for orders placed or performed during the hospital encounter of 02/24/19  Chlamydia/NGC rt PCR ([facility] only)   Specimen: Urine  Result Value Ref Range   Specimen source GC/Chlam URINE, RANDOM    Chlamydia Tr NOT DETECTED NOT DETECTED   N gonorrhoeae NOT DETECTED NOT DETECTED  OB RESULT CONSOLE Group B Strep  Result Value Ref Range   GBS Negative   Comprehensive metabolic panel  Result Value Ref Range   Sodium 137 135 - 145 mmol/L   Potassium 3.9 3.5 - 5.1 mmol/L   Chloride 104 98 - 111 mmol/L   CO2 22 22 - 32 mmol/L   Glucose, Bld 91 70 - 99 mg/dL   BUN 12 6 - 20 mg/dL   Creatinine, Ser 5.18 0.44 - 1.00 mg/dL   Calcium 9.5 8.9 - 84.1 mg/dL   Total Protein 6.9 6.5 - 8.1 g/dL   Albumin 3.3 (L) 3.5 - 5.0 g/dL   AST 20 15 - 41 U/L   ALT 21 0 - 44 U/L   Alkaline Phosphatase 193 (H) 38 - 126 U/L   Total Bilirubin 0.4 0.3 - 1.2 mg/dL   GFR calc non Af Amer >60 >60 mL/min   GFR calc Af Amer >60 >60 mL/min   Anion gap 11 5 - 15  CBC on admission  Result Value Ref Range   WBC 13.6 (H) 4.0 - 10.5 K/uL   RBC 3.96 3.87 - 5.11 MIL/uL   Hemoglobin 12.0 12.0 - 15.0 g/dL   HCT 66.0 36 - 46 %   MCV 91.7 80.0 - 100.0 fL   MCH 30.3 26.0 - 34.0 pg   MCHC 33.1 30.0 - 36.0 g/dL   RDW 63.0 16.0 - 10.9 %   Platelets 241 150 - 400 K/uL   nRBC 0.0 0.0 - 0.2 %  ROM Plus ([facility] only)  Result Value Ref Range   Rom Plus NEGATIVE   Protein / creatinine ratio, urine  Result Value Ref Range   Creatinine, Urine 153 mg/dL   Total Protein, Urine 14 mg/dL   Protein Creatinine Ratio 0.09 0.00 - 0.15 mg/mg[Cre]  RPR  Result Value Ref Range   RPR Ser Ql NON REACTIVE NON REACTIVE  OB RESULTS CONSOLE Varicella zoster antibody, IgG  Result Value Ref Range   Varicella Immune   OB RESULTS CONSOLE GC/Chlamydia  Result Value Ref Range    Gonorrhea Negative   CBC  Result Value Ref Range  WBC 22.6 (H) 4.0 - 10.5 K/uL   RBC 3.47 (L) 3.87 - 5.11 MIL/uL   Hemoglobin 10.7 (L) 12.0 - 15.0 g/dL   HCT 03.5 (L) 36 - 46 %   MCV 91.6 80.0 - 100.0 fL   MCH 30.8 26.0 - 34.0 pg   MCHC 33.6 30.0 - 36.0 g/dL   RDW 00.9 38.1 - 82.9 %   Platelets 196 150 - 400 K/uL   nRBC 0.0 0.0 - 0.2 %  Type and screen [facility]  Result Value Ref Range   ABO/RH(D) O POS    Antibody Screen NEG    Sample Expiration      02/27/2019,2359 Performed at [facility], [address] [place], [place] [address]       Assessment & Plan:   Problem List Items Addressed This Visit      Other   Depression, major, single episode, severe (HCC) - Primary    Increase zoloft to 100 mg daily, begin counseling through work program. FMLA paperwork to be filled out for 2 week leave 7/14 - 7/28. F/u in 1 month for recheck      Relevant Medications   sertraline (ZOLOFT) 100 MG tablet       Follow up plan: Return in about 4 weeks (around 09/04/2019) for mood f/u.

## 2019-08-07 NOTE — Assessment & Plan Note (Signed)
Increase zoloft to 100 mg daily, begin counseling through work program. FMLA paperwork to be filled out for 2 week leave 7/14 - 7/28. F/u in 1 month for recheck

## 2019-08-09 ENCOUNTER — Telehealth: Payer: Self-pay | Admitting: Family Medicine

## 2019-08-09 NOTE — Telephone Encounter (Signed)
lvm to make this apt.  

## 2019-08-09 NOTE — Telephone Encounter (Signed)
-----   Message from Particia Nearing, [place] sent at 08/07/2019  9:48 AM EDT ----- 1 month postpartum depression f/u

## 2019-08-15 NOTE — Telephone Encounter (Signed)
Received FMLA Paperwork for patient.  Started to complete but with further review-patient needs to be seen for some of the questions required.  Ex: (Cognitive Evaluation, Behavior Observation, Emotional Observation). All 3 of patient's last visits were Virtual.  Tried calling to schedule an appointment for patient to come in office. No answer, LVM for patient to return phone call.

## 2019-08-16 NOTE — Telephone Encounter (Signed)
Message sent to pt through mychart to schedule an in office appt.

## 2019-08-17 NOTE — Telephone Encounter (Signed)
Lvm to make apt for provider to fill required information for FMLA paper.

## 2019-08-18 NOTE — Telephone Encounter (Signed)
Pt has apt on 08/23/2019.Pt verbalized understanding.

## 2019-08-23 ENCOUNTER — Ambulatory Visit: Payer: Commercial Managed Care - PPO | Admitting: Family Medicine

## 2019-08-24 ENCOUNTER — Encounter: Payer: Self-pay | Admitting: Family Medicine

## 2019-08-24 ENCOUNTER — Ambulatory Visit (INDEPENDENT_AMBULATORY_CARE_PROVIDER_SITE_OTHER): Payer: Commercial Managed Care - PPO | Admitting: Nurse Practitioner

## 2019-08-24 ENCOUNTER — Other Ambulatory Visit: Payer: Self-pay

## 2019-08-24 ENCOUNTER — Encounter: Payer: Self-pay | Admitting: Nurse Practitioner

## 2019-08-24 ENCOUNTER — Telehealth: Payer: Self-pay | Admitting: Family Medicine

## 2019-08-24 DIAGNOSIS — F322 Major depressive disorder, single episode, severe without psychotic features: Secondary | ICD-10-CM | POA: Diagnosis not present

## 2019-08-24 MED ORDER — SERTRALINE HCL 50 MG PO TABS: 50.0000 mg | ORAL_TABLET | Freq: Every day | ORAL | 0 refills | Status: DC

## 2019-08-24 MED ORDER — SERTRALINE HCL 100 MG PO TABS: 100.0000 mg | ORAL_TABLET | Freq: Every day | ORAL | 0 refills | Status: DC

## 2019-08-24 NOTE — Assessment & Plan Note (Addendum)
Chronic, ongoing.  Sertraline helping, will increase to 150 mg today and follow up in 4 weeks.  Requesting FMLA for work for both continuous leave for 07/14-07/28 and intermittent leave.  Will fill out paperwork for previous continuous leave, however needing assistance from Psychiatry if missing work continues to be ongoing.  Referral to psychiatry placed today.

## 2019-08-24 NOTE — Progress Notes (Signed)
BP 118/78   Pulse 74   Temp 98.6 F (37 C) (Oral)   SpO2 97%    Subjective:    Patient ID: Claire Walker, female    DOB: 07/10/1995, 24 y.o.   MRN: [id number]  HPI: Claire Walker is a 24 y.o. female presenting for [facility] paperwork.  Chief Complaint  Patient presents with  . FMLA    postpartum depression- continuous leave from 08/02/19-08/16/19, also out 08/22/19-08/24/19. Also requesting intermintent leave for future days if needed. Patient states she cannot think clearly and focus at work when she is having a mental health day    MOOD Patient reports continuous leave from 08/02/2019 through 08/16/2019 and is requesting intermittent leave for future days that she needs mental health days.  Reports some days she feels great and some days she feels terrible.  Still taking sertraline 100 mg dose.  Has not gotten to schedule with counseling but plans to within the next week.    No Known Allergies  Outpatient Encounter Medications as of 08/24/2019  Medication Sig  . norethindrone-ethinyl estradiol (JUNEL FE 1/20) 1-20 MG-MCG tablet Take 1 tablet by mouth daily.  . sertraline (ZOLOFT) 100 MG tablet Take 1 tablet (100 mg total) by mouth daily.  . [DISCONTINUED] sertraline (ZOLOFT) 100 MG tablet Take 1 tablet (100 mg total) by mouth daily.  . Naltrexone-buPROPion HCl ER (CONTRAVE) 8-90 MG TB12 Start 1 tablet every morning for 7 days, then 1 tablet twice daily for 7 days, then 2 tablets every morning and one every evening, then 2 tabs twice daily (Patient not taking: Reported on 08/07/2019)  . sertraline (ZOLOFT) 50 MG tablet Take 1 tablet (50 mg total) by mouth daily.   No facility-administered encounter medications on file as of 08/24/2019.   Patient Active Problem List   Diagnosis Date Noted  . Gestational hypertension 02/24/2019  . BMI 45.0-49.9, adult (HCC) 01/11/2019  . Depression, major, single episode, severe (HCC) 04/07/2017  . Allergic rhinitis 10/11/2014   Past Medical History:  Diagnosis  Date  . Gestational hypertension   . Morbid obesity with BMI of 45.0-49.9, adult (HCC) 04/07/2019  . Normal vaginal delivery 02/26/2019  . Obesity affecting pregnancy 01/11/2019  . Supervision of high risk pregnancy, antepartum 01/11/2019   [facility] Prenatal Labs Dating 32 week ultrasoud Blood type: O/Positive/-- (12/16 1532)  Genetic Screen Late onet, not done Antibody:Negative (12/16 1532) Anatomic [place] Normal, anterior placenta Rubella: 2.16 (12/16 1532) Varicella:Immune GTT Third trimester: Hemoglobin A1C 5.1% RPR: Non Reactive (12/16 1532)  Rhogam n/a HBsAg: Negative (12/16 1532)  TDaP vaccine      01/25/19                HIV   Relevant past medical, surgical, family and social history reviewed and updated as indicated. Interim medical history since our last visit reviewed.  Review of Systems  Constitutional: Positive for fatigue. Negative for activity change, appetite change and fever.  Respiratory: Negative.   Cardiovascular: Negative.   Skin: Negative.   Neurological: Negative.  Negative for dizziness, weakness, light-headedness and headaches.  Psychiatric/Behavioral: Positive for decreased concentration. Negative for agitation, behavioral problems, confusion, dysphoric mood, hallucinations, self-injury, sleep disturbance and suicidal ideas. The patient is nervous/anxious. The patient is not hyperactive.    Per HPI unless specifically indicated above     Objective:    BP 118/78   Pulse 74   Temp 98.6 F (37 C) (Oral)   SpO2 97%   Wt Readings from Last 3 Encounters:  08/07/19 (!) 340 lb (154.2 kg)  08/03/19 (!) 350 lb (158.8 kg)  06/27/19 (!) 340 lb (154.2 kg)    Physical Exam Vitals and nursing note reviewed.  Constitutional:      Appearance: Normal appearance. She is obese.  Cardiovascular:     Rate and Rhythm: Normal rate.  Pulmonary:     Effort: Pulmonary effort is normal. No respiratory distress.  Skin:    General: Skin is warm and dry.     Coloration: Skin is  not jaundiced or pale.  Neurological:     General: No focal deficit present.     Mental Status: She is alert and oriented to person, place, and time.     Motor: No weakness.     Gait: Gait normal.  Psychiatric:        Mood and Affect: Mood normal.        Behavior: Behavior normal.        Thought Content: Thought content normal.        Judgment: Judgment normal.       Assessment & Plan:   Problem List Items Addressed This Visit      Other   Depression, major, single episode, severe (HCC)    Chronic, ongoing.  Sertraline helping, will increase to 150 mg today and follow up in 4 weeks.  Requesting FMLA for work for both continuous leave for 07/14-07/28 and intermittent leave.  Will fill out paperwork for previous continuous leave, however needing assistance from Psychiatry if missing work continues to be ongoing.  Referral to psychiatry placed today.      Relevant Medications   sertraline (ZOLOFT) 100 MG tablet   sertraline (ZOLOFT) 50 MG tablet   Other Relevant Orders   Ambulatory referral to Psychiatry       Follow up plan: Return in about 4 weeks (around 09/21/2019) for mood f/u.

## 2019-08-24 NOTE — Patient Instructions (Addendum)
Nice to meet you, Patch!  We will send your paperwork in to your employer.   Increase your sertraline to 150 mg daily.  Let's plan to follow up in about 4 weeks to see how that is going.  Be sure to reach out to your Counselor at work.  You will hear from our referral team regarding the Psychiatry consultation.  Take care!   Postpartum Baby Blues The postpartum period begins right after the birth of a baby. During this time, there is often a lot of joy and excitement. It is also a time of many changes in the life of the parents. No matter how many times a mother gives birth, each child brings new challenges to the family, including different ways of relating to one another. It is common to have feelings of excitement along with confusing changes in moods, emotions, and thoughts. You may feel happy one minute and sad or stressed the next. These feelings of sadness usually happen in the period right after you have your baby, and they go away within a week or two. This is called the "baby blues." What are the causes? There is no known cause of baby blues. It is likely caused by a combination of factors. However, changes in hormone levels after childbirth are believed to trigger some of the symptoms. Other factors that can play a role in these mood changes include:  Lack of sleep.  Stressful life events, such as poverty, caring for a loved one, or death of a loved one.  Genetics. What are the signs or symptoms? Symptoms of this condition include:  Brief changes in mood, such as going from extreme happiness to sadness.  Decreased concentration.  Difficulty sleeping.  Crying spells and tearfulness.  Loss of appetite.  Irritability.  Anxiety. If the symptoms of baby blues last for more than 2 weeks or become more severe, you may have postpartum depression. How is this diagnosed? This condition is diagnosed based on an evaluation of your symptoms. There are no medical or lab tests  that lead to a diagnosis, but there are various questionnaires that a health care provider may use to identify women with the baby blues or postpartum depression. How is this treated? Treatment is not needed for this condition. The baby blues usually go away on their own in 1-2 weeks. Social support is often all that is needed. You will be encouraged to get adequate sleep and rest. Follow these instructions at home: Lifestyle      Get as much rest as you can. Take a nap when the baby sleeps.  Exercise regularly as told by your health care provider. Some women find yoga and walking to be helpful.  Eat a balanced and nourishing diet. This includes plenty of fruits and vegetables, whole grains, and lean proteins.  Do little things that you enjoy. Have a cup of tea, take a bubble bath, read your favorite magazine, or listen to your favorite music.  Avoid alcohol.  Ask for help with household chores, cooking, grocery shopping, or running errands. Do not try to do everything yourself. Consider hiring a postpartum doula to help. This is a professional who specializes in providing support to new mothers.  Try not to make any major life changes during pregnancy or right after giving birth. This can add stress. General instructions  Talk to people close to you about how you are feeling. Get support from your partner, family members, friends, or other new moms. You may want to  join a support group.  Find ways to cope with stress. This may include: ? Writing your thoughts and feelings in a journal. ? Spending time outside. ? Spending time with people who make you laugh.  Try to stay positive in how you think. Think about the things you are grateful for.  Take over-the-counter and prescription medicines only as told by your health care provider.  Let your health care provider know if you have any concerns.  Keep all postpartum visits as told by your health care provider. This is  important. Contact a health care provider if:  Your baby blues do not go away after 2 weeks. Get help right away if:  You have thoughts of taking your own life (suicidal thoughts).  You think you may harm the baby or other people.  You see or hear things that are not there (hallucinations). Summary  After giving birth, you may feel happy one minute and sad or stressed the next. Feelings of sadness that happen right after the baby is born and go away after a week or two are called the "baby blues."  You can manage the baby blues by getting enough rest, eating a healthy diet, exercising, spending time with supportive people, and finding ways to cope with stress.  If feelings of sadness and stress last longer than 2 weeks or get in the way of caring for your baby, talk to your health care provider. This may mean you have postpartum depression. This information is not intended to replace advice given to you by your health care provider. Make sure you discuss any questions you have with your health care provider. Document Revised: 04/29/2018 Document Reviewed: 03/03/2016 Elsevier Patient Education  2020 [employer].

## 2019-08-24 NOTE — Telephone Encounter (Signed)
Mailbox full unable to lvm to make this apt.  

## 2019-08-24 NOTE — Telephone Encounter (Signed)
-----   Message from Wells Guiles, NP sent at 08/24/2019 12:57 PM EDT ----- Please reach out to make 4 week follow up appointment

## 2019-08-28 ENCOUNTER — Encounter: Payer: Self-pay | Admitting: Family Medicine

## 2019-08-28 NOTE — Telephone Encounter (Signed)
Please let patient know that referral was placed last week.  It looks like they have been trying to contact her to schedule an appointment - her voice mail box is full but it looks like they also sent her a text.  She should be able to schedule with them.

## 2019-08-29 NOTE — Telephone Encounter (Signed)
Mailbox full unable to lvm to make this apt.  

## 2019-08-30 ENCOUNTER — Encounter: Payer: Self-pay | Admitting: Family Medicine

## 2019-08-30 NOTE — Telephone Encounter (Signed)
Mail box full unable to lvm to make this apt. sent letter.

## 2019-08-31 ENCOUNTER — Telehealth: Payer: Self-pay | Admitting: Family Medicine

## 2019-08-31 NOTE — Telephone Encounter (Signed)
Spoke with pt she states that she has been trying to contact them all week. Pt states that if we could make an appt for her in the evening as late as possible she is will to work it out, just let her know.  Copied from CRM [id number]. Topic: General - Other >> Aug 31, 2019  4:25 PM Claire Walker wrote: Reason for CRM: Patient called to inform Claire Walker that the Psychiatrist she was referred to she is having Walker hard time getting in touch with them. Per patient she called and left messages but have yet to receive Walker call back. Please contact patient at  Ph# [phone]

## 2019-09-01 ENCOUNTER — Encounter: Payer: Self-pay | Admitting: Family Medicine

## 2019-09-04 ENCOUNTER — Encounter: Payer: Self-pay | Admitting: Family Medicine

## 2019-09-04 ENCOUNTER — Other Ambulatory Visit: Payer: Self-pay

## 2019-09-04 ENCOUNTER — Ambulatory Visit (INDEPENDENT_AMBULATORY_CARE_PROVIDER_SITE_OTHER): Payer: Commercial Managed Care - PPO | Admitting: Family Medicine

## 2019-09-04 DIAGNOSIS — F322 Major depressive disorder, single episode, severe without psychotic features: Secondary | ICD-10-CM | POA: Diagnosis not present

## 2019-09-04 MED ORDER — SERTRALINE HCL 50 MG PO TABS: 50.0000 mg | ORAL_TABLET | Freq: Every day | ORAL | 0 refills | Status: DC

## 2019-09-04 MED ORDER — SERTRALINE HCL 100 MG PO TABS: 100.0000 mg | ORAL_TABLET | Freq: Every day | ORAL | 0 refills | Status: DC

## 2019-09-04 NOTE — Progress Notes (Signed)
BP 115/81   Pulse 74   Temp 97.8 F (36.6 C) (Oral)   Wt (!) 334 lb (151.5 kg)   SpO2 96%   BMI 45.30 kg/m    Subjective:    Patient ID: Claire Walker, female    DOB: 1995/06/20, 24 y.o.   MRN: [id number]  HPI: Claire Walker is a 24 y.o. female  Chief Complaint  Patient presents with  . Paperwork    intermittent FMLA for work   Here today to discuss intermittent FMLA needed for missing some time at work due to her depression. Needing a note for today and several other days she's missed but not sure which exact days needed yet. Does feel things are getting so much better on the increased zoloft. Still having some days where she feels down, lacking motivation and crying often but overall much better. Denies SI/HI, side effects.   Depression screen [facility] 2/9 08/03/2019 06/27/2019 06/15/2018  Decreased Interest 3 3 1   Down, Depressed, Hopeless 3 3 1   PHQ - 2 Score 6 6 2   Altered sleeping 3 3 1   Tired, decreased energy 3 3 1   Change in appetite 1 3 3   Feeling bad or failure about yourself  3 3 1   Trouble concentrating 3 3 0  Moving slowly or fidgety/restless 3 3 0  Suicidal thoughts 0 0 0  PHQ-9 Score 22 24 8   Difficult doing work/chores Extremely dIfficult Extremely dIfficult Not difficult at all   GAD 7 : Generalized Anxiety Score 06/27/2019 06/15/2018  Nervous, Anxious, on Edge 3 0  Control/stop worrying 3 1  Worry too much - different things 3 1  Trouble relaxing 3 1  Restless 0 1  Easily annoyed or irritable 3 0  Afraid - awful might happen 2 0  Total GAD 7 Score 17 4  Anxiety Difficulty Extremely difficult Not difficult at all      Relevant past medical, surgical, family and social history reviewed and updated as indicated. Interim medical history since our last visit reviewed. Allergies and medications reviewed and updated.  Review of Systems  Per HPI unless specifically indicated above     Objective:    BP 115/81   Pulse 74   Temp 97.8 F (36.6 C) (Oral)   Wt  (!) 334 lb (151.5 kg)   SpO2 96%   BMI 45.30 kg/m   Wt Readings from Last 3 Encounters:  09/04/19 (!) 334 lb (151.5 kg)  08/07/19 (!) 340 lb (154.2 kg)  08/03/19 (!) 350 lb (158.8 kg)    Physical Exam Vitals and nursing note reviewed.  Constitutional:      Appearance: Normal appearance. She is not ill-appearing.  HENT:     Head: Atraumatic.  Eyes:     Extraocular Movements: Extraocular movements intact.     Conjunctiva/sclera: Conjunctivae normal.  Cardiovascular:     Rate and Rhythm: Normal rate and regular rhythm.     Heart sounds: Normal heart sounds.  Pulmonary:     Effort: Pulmonary effort is normal.     Breath sounds: Normal breath sounds.  Musculoskeletal:        General: Normal range of motion.     Cervical back: Normal range of motion and neck supple.  Skin:    General: Skin is warm and dry.  Neurological:     Mental Status: She is alert and oriented to person, place, and time.  Psychiatric:        Mood and Affect: Mood normal.  Thought Content: Thought content normal.        Judgment: Judgment normal.     Results for orders placed or performed during the hospital encounter of 02/24/19  Chlamydia/NGC rt PCR ([facility] only)   Specimen: Urine  Result Value Ref Range   Specimen source GC/Chlam URINE, RANDOM    Chlamydia Tr NOT DETECTED NOT DETECTED   N gonorrhoeae NOT DETECTED NOT DETECTED  OB RESULT CONSOLE Group B Strep  Result Value Ref Range   GBS Negative   Comprehensive metabolic panel  Result Value Ref Range   Sodium 137 135 - 145 mmol/L   Potassium 3.9 3.5 - 5.1 mmol/L   Chloride 104 98 - 111 mmol/L   CO2 22 22 - 32 mmol/L   Glucose, Bld 91 70 - 99 mg/dL   BUN 12 6 - 20 mg/dL   Creatinine, Ser 5.36 0.44 - 1.00 mg/dL   Calcium 9.5 8.9 - 64.4 mg/dL   Total Protein 6.9 6.5 - 8.1 g/dL   Albumin 3.3 (L) 3.5 - 5.0 g/dL   AST 20 15 - 41 U/L   ALT 21 0 - 44 U/L   Alkaline Phosphatase 193 (H) 38 - 126 U/L   Total Bilirubin 0.4 0.3 - 1.2 mg/dL    GFR calc non Af Amer >60 >60 mL/min   GFR calc Af Amer >60 >60 mL/min   Anion gap 11 5 - 15  CBC on admission  Result Value Ref Range   WBC 13.6 (H) 4.0 - 10.5 K/uL   RBC 3.96 3.87 - 5.11 MIL/uL   Hemoglobin 12.0 12.0 - 15.0 g/dL   HCT 03.4 36 - 46 %   MCV 91.7 80.0 - 100.0 fL   MCH 30.3 26.0 - 34.0 pg   MCHC 33.1 30.0 - 36.0 g/dL   RDW 74.2 59.5 - 63.8 %   Platelets 241 150 - 400 K/uL   nRBC 0.0 0.0 - 0.2 %  ROM Plus ([facility] only)  Result Value Ref Range   Rom Plus NEGATIVE   Protein / creatinine ratio, urine  Result Value Ref Range   Creatinine, Urine 153 mg/dL   Total Protein, Urine 14 mg/dL   Protein Creatinine Ratio 0.09 0.00 - 0.15 mg/mg[Cre]  RPR  Result Value Ref Range   RPR Ser Ql NON REACTIVE NON REACTIVE  OB RESULTS CONSOLE Varicella zoster antibody, IgG  Result Value Ref Range   Varicella Immune   OB RESULTS CONSOLE GC/Chlamydia  Result Value Ref Range   Gonorrhea Negative   CBC  Result Value Ref Range   WBC 22.6 (H) 4.0 - 10.5 K/uL   RBC 3.47 (L) 3.87 - 5.11 MIL/uL   Hemoglobin 10.7 (L) 12.0 - 15.0 g/dL   HCT 75.6 (L) 36 - 46 %   MCV 91.6 80.0 - 100.0 fL   MCH 30.8 26.0 - 34.0 pg   MCHC 33.6 30.0 - 36.0 g/dL   RDW 43.3 29.5 - 18.8 %   Platelets 196 150 - 400 K/uL   nRBC 0.0 0.0 - 0.2 %  Type and screen [facility]  Result Value Ref Range   ABO/RH(D) O POS    Antibody Screen NEG    Sample Expiration      02/27/2019,2359 Performed at [facility], [address] [place], [place] [address]       Assessment & Plan:   Problem List Items Addressed This Visit      Other   Depression, major, single episode, severe (  HCC)    Will increase to 150 mg zoloft and continue to monitor for benefit. Pt to send over dates needed and FMLA paperwork to be completed, will complete upon receipt of these      Relevant Medications   sertraline (ZOLOFT) 50 MG tablet   sertraline (ZOLOFT) 100 MG tablet       Follow up plan: Return  in about 6 months (around 03/06/2020) for Depression f/u.

## 2019-09-10 NOTE — Assessment & Plan Note (Signed)
Will increase to 150 mg zoloft and continue to monitor for benefit. Pt to send over dates needed and FMLA paperwork to be completed, will complete upon receipt of these

## 2019-09-11 ENCOUNTER — Encounter: Payer: Self-pay | Admitting: Family Medicine

## 2019-09-12 ENCOUNTER — Telehealth: Payer: Self-pay | Admitting: Family Medicine

## 2019-09-12 ENCOUNTER — Encounter: Payer: Self-pay | Admitting: Family Medicine

## 2019-09-12 NOTE — Telephone Encounter (Signed)
Copied from CRM [id number]. Topic: General - Other >> Sep 12, 2019 10:22 AM Wyonia Hough E wrote: Reason for CRM: Pt sent a mychart message for the dates requested for her leave/ Pt will also upload a copy of the paperwork on mychart/ Pt asked for this to be done and sent back to [employer] today or she will lose her job/ please advise asap

## 2019-09-12 NOTE — Telephone Encounter (Signed)
Form is being completed. Pt will be alerted when finished.

## 2019-09-18 ENCOUNTER — Telehealth: Payer: Self-pay | Admitting: Family Medicine

## 2019-09-18 NOTE — Telephone Encounter (Signed)
Copied from CRM [id number]. Topic: General - Other >> Sep 18, 2019  1:35 PM Gwenlyn Fudge wrote: Reason for CRM: Pt called stating that she is needing to have provider send in intermittent paperwork to her job. She states that [employer] was working on it, but the wrong paperwork was sent over. Pt states that she is needing to have this sent over today. Please advise.

## 2019-09-19 NOTE — Telephone Encounter (Signed)
See my chart message

## 2019-12-21 ENCOUNTER — Other Ambulatory Visit: Payer: Self-pay | Admitting: Nurse Practitioner

## 2019-12-21 ENCOUNTER — Other Ambulatory Visit: Payer: Self-pay | Admitting: Family Medicine

## 2019-12-21 DIAGNOSIS — F322 Major depressive disorder, single episode, severe without psychotic features: Secondary | ICD-10-CM

## 2019-12-21 MED ORDER — SERTRALINE HCL 100 MG PO TABS: 100.0000 mg | ORAL_TABLET | Freq: Every day | ORAL | 1 refills | Status: DC

## 2019-12-21 NOTE — Telephone Encounter (Signed)
Can this be filled ?

## 2019-12-21 NOTE — Telephone Encounter (Signed)
  Notes to clinic:  Last seen by Roosvelt Maser Has not seen another provider   Requested Prescriptions  Pending Prescriptions Disp Refills   sertraline (ZOLOFT) 50 MG tablet [Pharmacy Med Name: SERTRALINE HCL 50 MG TABLET] 90 tablet 1    Sig: Take 1 tablet (50 mg total) by mouth daily. Take alongside 100 mg zoloft tablet      Psychiatry:  Antidepressants - SSRI Passed - 12/21/2019 12:33 PM      Passed - Completed PHQ-2 or PHQ-9 in the last 360 days      Passed - Valid encounter within last 6 months    Recent Outpatient Visits           3 months ago Depression, major, single episode, severe (HCC)   [facility] Particia Nearing, PA-C   3 months ago Depression, major, single episode, severe (HCC)   [facility] Valentino Nose, NP   4 months ago Depression, major, single episode, severe [facility])   [facility] Roosvelt Maser [place], PA-C   4 months ago Depression, major, single episode, severe [facility])   [facility] Roosvelt Maser [place], [place]   5 months ago Depression, major, single episode, severe [facility])   [facility] Roosvelt Maser [place], [place]

## 2020-01-08 ENCOUNTER — Ambulatory Visit: Payer: Self-pay | Admitting: *Deleted

## 2020-01-08 ENCOUNTER — Other Ambulatory Visit: Payer: Self-pay

## 2020-01-08 DIAGNOSIS — Z20822 Contact with and (suspected) exposure to covid-19: Secondary | ICD-10-CM

## 2020-01-08 NOTE — Telephone Encounter (Signed)
Pt has apt for virtual on 01/09/2020 with Dr.Rumball Pt verbalized understanding.

## 2020-01-08 NOTE — Progress Notes (Signed)
Virtual Visit via Video Note  I connected with Claire Walker on 01/09/20 at 11:20 AM EST by a video enabled telemedicine application and verified that I am speaking with the correct person using two identifiers.  Location: Patient: home Provider: [facility]   I discussed the limitations of evaluation and management by telemedicine and the availability of in person appointments. The patient expressed understanding and agreed to proceed.  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 Friday symptoms started.  - tested for COVID yesterday, awaiting results. - unvaccinated against COVID. - cough, chills, fever, body aches, eye pain, fatigue, headache, SOB Worst symptom: body aches, cough Fever: yes, 100.4 F Cough: yes, nonproductive Shortness of breath: yes with movement Chest pain: yes, with cough Chest tightness: yes Chest congestion: no Nasal congestion: no Runny nose: yes Post nasal drip: no Sneezing: no Sore throat: yes Swollen glands: unsure Sinus pressure: yes Headache: yes Ear pain: no   Ear pressure: no   Eyes red/itching:no Eye drainage/crusting: no  Vomiting: no Fatigue: yes Sick contacts: yes Recurrent sinusitis: no Relief with OTC cold/cough medications: tylenol helps with headache  Treatments attempted: tylenol  Slight loss of smell. Still has taste.   Observations/Objective:  Tired appearing, in NAD. Comfortable WOB on RA.  Assessment and Plan:  URI (upper respiratory infection) With +COVID exposure, awaiting results. Supportive care including OTC symptom relief, maintaining adequate oral hydration, honey for cough. If COVID positive, may be eligible for MAB tx d/t obesity and depression. Self-isolation instructions provided. Return and emergency precautions reviewed.     I discussed the assessment and treatment plan with the patient. The  patient was provided an opportunity to ask questions and all were answered. The patient agreed with the plan and demonstrated an understanding of the instructions.   The patient was advised to call back or seek an in-person evaluation if the symptoms worsen or if the condition fails to improve as anticipated.  I provided 16 minutes of non-face-to-face time during this encounter.   Caro Laroche, DO

## 2020-01-08 NOTE — Telephone Encounter (Signed)
Patient calling in with complaints of SOB, muscle aches, headache, sore throat, chills, cough and fever of 100.3 that started on Friday. Patient states that she has been taking over the counter medications for symptoms. 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 Last exposure to the point guard was Wed or Thurs of last week. Patient advised to seek treatment in the ED or urgent care if SOB continues. Patient scheduled for COVID testing today at the community testing site.Patient verbalized understanding.  Reason for Disposition . MILD difficulty breathing (e.g., minimal/no SOB at rest, SOB with walking, pulse <100)  Answer Assessment - Initial Assessment Questions 1. COVID-19 DIAGNOSIS: "Who made your Coronavirus (COVID-19) diagnosis?" "Was it confirmed by a positive lab test?" If not diagnosed by a HCP, ask "Are there lots of cases (community spread) where you live?" (See public health department website, if unsure)     Not confirmed 2. COVID-19 EXPOSURE: "Was there any known exposure to COVID before the symptoms began?" CDC Definition of close contact: within 6 feet (2 meters) for a total of 15 minutes or more over a 24-hour period.      yes 3. ONSET: "When did the COVID-19 symptoms start?"     Friday 4. WORST SYMPTOM: "What is your worst symptom?" (e.g., cough, fever, shortness of breath, muscle aches)     SOB 5. COUGH: "Do you have a cough?" If Yes, ask: "How bad is the cough?"       yes 6. FEVER: "Do you have a fever?" If Yes, ask: "What is your temperature, how was it measured, and when did it start?"     Yes, 100.3 7. RESPIRATORY STATUS: "Describe your breathing?" (e.g., shortness of breath, wheezing, unable to speak)      SOB with resting 8. BETTER-SAME-WORSE: "Are you getting better, staying the same or getting worse compared to yesterday?"  If getting worse, ask, "In what way?"     Today a lot more achier than yesterday, symptoms are  getting worse 9. HIGH RISK DISEASE: "Do you have any chronic medical problems?" (e.g., asthma, heart or lung disease, weak immune system, obesity, etc.)     No 10. PREGNANCY: "Is there any chance you are pregnant?" "When was your last menstrual period?"       no 11. OTHER SYMPTOMS: "Do you have any other symptoms?"  (e.g., chills, fatigue, headache, loss of smell or taste, muscle pain, sore throat; new loss of smell or taste especially support the diagnosis of COVID-19)       Chills, headache, muscle pain, sore throat, does not think she has her smell  Protocols used: CORONAVIRUS (COVID-19) DIAGNOSED OR SUSPECTED-A-AH

## 2020-01-08 NOTE — Telephone Encounter (Signed)
Please see if we can get in for virtual visit this week.  Thank you.

## 2020-01-09 ENCOUNTER — Other Ambulatory Visit: Payer: Self-pay

## 2020-01-09 ENCOUNTER — Telehealth (INDEPENDENT_AMBULATORY_CARE_PROVIDER_SITE_OTHER): Payer: Self-pay | Admitting: Family Medicine

## 2020-01-09 ENCOUNTER — Encounter: Payer: Self-pay | Admitting: Family Medicine

## 2020-01-09 DIAGNOSIS — Z8616 Personal history of COVID-19: Secondary | ICD-10-CM | POA: Insufficient documentation

## 2020-01-09 DIAGNOSIS — J069 Acute upper respiratory infection, unspecified: Secondary | ICD-10-CM

## 2020-01-09 LAB — NOVEL CORONAVIRUS, NAA: SARS-CoV-2, NAA: DETECTED — AB

## 2020-01-09 LAB — SARS-COV-2, NAA 2 DAY TAT

## 2020-01-09 NOTE — Patient Instructions (Signed)
It was great to see you!  Our plans for today:  - We will let you know once your COVID test results.  - Try an over the counter cough/cold medication like dayquil or theraflu for your symptoms. Honey can help with cough.  - You should stay in self-isolation unless your result comes back negative or 10 days from symptom onset and fever free for 24 hours without use of tylenol or ibuprofen.  - See below for instructions on self-quarantine.  Take care and seek immediate care sooner if you develop any concerns.   Dr. Linwood Dibbles     Person Under Monitoring Name: Claire Walker  Location: [address] [place] [address]   Infection Prevention Recommendations for Individuals Confirmed to have, or Being Evaluated for, 2019 Novel Coronavirus (COVID-19) Infection Who Receive Care at Home  Individuals who are confirmed to have, or are being evaluated for, COVID-19 should follow the prevention steps below until a healthcare provider or local or state health department says they can return to normal activities.  Stay home except to get medical care You should restrict activities outside your home, except for getting medical care. Do not go to work, school, or public areas, and do not use public transportation or taxis.  Call ahead before visiting your doctor Before your medical appointment, call the healthcare provider and tell them that you have, or are being evaluated for, COVID-19 infection. This will help the healthcare provider's office take steps to keep other people from getting infected. Ask your healthcare provider to call the local or state health department.  Monitor your symptoms Seek prompt medical attention if your illness is worsening (e.g., difficulty breathing). Before going to your medical appointment, call the healthcare provider and tell them that you have, or are being evaluated for, COVID-19 infection. Ask your healthcare provider to call the local or state health  department.  Wear a facemask You should wear a facemask that covers your nose and mouth when you are in the same room with other people and when you visit a healthcare provider. People who live with or visit you should also wear a facemask while they are in the same room with you.  Separate yourself from other people in your home As much as possible, you should stay in a different room from other people in your home. Also, you should use a separate bathroom, if available.  Avoid sharing household items You should not share dishes, drinking glasses, cups, eating utensils, towels, bedding, or other items with other people in your home. After using these items, you should wash them thoroughly with soap and water.  Cover your coughs and sneezes Cover your mouth and nose with a tissue when you cough or sneeze, or you can cough or sneeze into your sleeve. Throw used tissues in a lined trash can, and immediately wash your hands with soap and water for at least 20 seconds or use an alcohol-based hand rub.  Wash your [employer] your hands often and thoroughly with soap and water for at least 20 seconds. You can use an alcohol-based hand sanitizer if soap and water are not available and if your hands are not visibly dirty. Avoid touching your eyes, nose, and mouth with unwashed hands.   Prevention Steps for Caregivers and Household Members of Individuals Confirmed to have, or Being Evaluated for, COVID-19 Infection Being Cared for in the Home  If you live with, or provide care at home for, a person confirmed to have,  or being evaluated for, COVID-19 infection please follow these guidelines to prevent infection:  Follow healthcare provider's instructions Make sure that you understand and can help the patient follow any healthcare provider instructions for all care.  Provide for the patient's basic needs You should help the patient with basic needs in the home and provide support for getting  groceries, prescriptions, and other personal needs.  Monitor the patient's symptoms If they are getting sicker, call his or her medical provider and tell them that the patient has, or is being evaluated for, COVID-19 infection. This will help the healthcare provider's office take steps to keep other people from getting infected. Ask the healthcare provider to call the local or state health department.  Limit the number of people who have contact with the patient  If possible, have only one caregiver for the patient.  Other household members should stay in another home or place of residence. If this is not possible, they should stay  in another room, or be separated from the patient as much as possible. Use a separate bathroom, if available.  Restrict visitors who do not have an essential need to be in the home.  Keep older adults, very young children, and other sick people away from the patient Keep older adults, very young children, and those who have compromised immune systems or chronic health conditions away from the patient. This includes people with chronic heart, lung, or kidney conditions, diabetes, and cancer.  Ensure good ventilation Make sure that shared spaces in the home have good air flow, such as from an air conditioner or an opened window, weather permitting.  Wash your hands often  Wash your hands often and thoroughly with soap and water for at least 20 seconds. You can use an alcohol based hand sanitizer if soap and water are not available and if your hands are not visibly dirty.  Avoid touching your eyes, nose, and mouth with unwashed hands.  Use disposable paper towels to dry your hands. If not available, use dedicated cloth towels and replace them when they become wet.  Wear a facemask and gloves  Wear a disposable facemask at all times in the room and gloves when you touch or have contact with the patient's blood, body fluids, and/or secretions or excretions,  such as sweat, saliva, sputum, nasal mucus, vomit, urine, or feces.  Ensure the mask fits over your nose and mouth tightly, and do not touch it during use.  Throw out disposable facemasks and gloves after using them. Do not reuse.  Wash your hands immediately after removing your facemask and gloves.  If your personal clothing becomes contaminated, carefully remove clothing and launder. Wash your hands after handling contaminated clothing.  Place all used disposable facemasks, gloves, and other waste in a lined container before disposing them with other household waste.  Remove gloves and wash your hands immediately after handling these items.  Do not share dishes, glasses, or other household items with the patient  Avoid sharing household items. You should not share dishes, drinking glasses, cups, eating utensils, towels, bedding, or other items with a patient who is confirmed to have, or being evaluated for, COVID-19 infection.  After the person uses these items, you should wash them thoroughly with soap and water.  Wash laundry thoroughly  Immediately remove and wash clothes or bedding that have blood, body fluids, and/or secretions or excretions, such as sweat, saliva, sputum, nasal mucus, vomit, urine, or feces, on them.  Wear gloves when handling  laundry from the patient.  Read and follow directions on labels of laundry or clothing items and detergent. In general, wash and dry with the warmest temperatures recommended on the label.  Clean all areas the individual has used often  Clean all touchable surfaces, such as counters, tabletops, doorknobs, bathroom fixtures, toilets, phones, keyboards, tablets, and bedside tables, every day. Also, clean any surfaces that may have blood, body fluids, and/or secretions or excretions on them.  Wear gloves when cleaning surfaces the patient has come in contact with.  Use a diluted bleach solution (e.g., dilute bleach with 1 part bleach and 10  parts water) or a household disinfectant with a label that says EPA-registered for coronaviruses. To make a bleach solution at home, add 1 tablespoon of bleach to 1 quart (4 cups) of water. For a larger supply, add  cup of bleach to 1 gallon (16 cups) of water.  Read labels of cleaning products and follow recommendations provided on product labels. Labels contain instructions for safe and effective use of the cleaning product including precautions you should take when applying the product, such as wearing gloves or eye protection and making sure you have good ventilation during use of the product.  Remove gloves and wash hands immediately after cleaning.  Monitor yourself for signs and symptoms of illness Caregivers and household members are considered close contacts, should monitor their health, and will be asked to limit movement outside of the home to the extent possible. Follow the monitoring steps for close contacts listed on the symptom monitoring form.   ? If you have additional questions, contact your local health department or call the epidemiologist on call at [phone] (available 24/7). ? This guidance is subject to change. For the most up-to-date guidance from [facility], please refer to their website: [email or web address]

## 2020-01-09 NOTE — Assessment & Plan Note (Signed)
With +COVID exposure, awaiting results. Supportive care including OTC symptom relief, maintaining adequate oral hydration, honey for cough. If COVID positive, may be eligible for MAB tx d/t obesity and depression. Self-isolation instructions provided. Return and emergency precautions reviewed.

## 2020-01-09 NOTE — Progress Notes (Signed)
Contacted via MyChart   Good evening Claire Walker, your Covid testing did return positive:( I have left message with our monoclonal antibody infusion center team to reach out to you about antibody infusion.  The Food and Drug Administration (FDA) has given emergency use of this medication to treat those with mild to moderate symptoms who have risk factors that have been identified for severe symptoms related to COVID-19. This new treatment is a monoclonal antibody - the way it works its that it attaches like a magnet to the SARS-CoV2 virus (the virus that causes COVID-19) and stops it from infecting more cells in your body. It does not kill the virus but it prevents it from spreading throughout your body with the hope that it will decrease your symptoms after it is given.   This new medication is an intravenous (IV) infusion that is given over one half hour in our outpatient infusion clinic here in [place]. We will require you to stay roughly 60 minutes after the infusion to ensure you are tolerating it and watch for any allergic reaction to the medication. More information will also be given to you at the time of your appointment. There are three  different medications. The medications are called Bamlanivimab & Etesevimab (by Olga Coaster) or Casirivimab & Imdevimab (by Regeneron) or Sotrovimab.   The side effects that have been seen with this treatment: 2-4% of recipients experience nausea, vomiting, diarrhea, dizziness, headaches, itching, worsening fevers or chills for ~24 hours. There have been no serious infusion related reactions Of the over 3000 patients who received the infusion one did have an allergic response that resolved once the infusion was stopped - this is why we monitor all of our patients closely for an hour after the infusion.  The covid 19 vaccine (including boosters) must be delayed at least 90 days after receiving this infusion.  The medication itself is free, but your insurance will be  charged an infusion fee. The amount you may owe later varies from insurance to insurance. If you do not have insurance, we can put you in touch with our billing department. The CMS codes are:  M0243 or M0245 or [phone]  The team should reach out to you over next 24-48 hours.  At this time you do need to self quarantine for 10 days and alert anyone who you may have been around to possible exposure.  If any questions please let us know.  If worsening shortness of breath or any chest pain, immediately go to ER.

## 2020-01-11 ENCOUNTER — Ambulatory Visit: Payer: Self-pay | Admitting: *Deleted

## 2020-01-11 ENCOUNTER — Ambulatory Visit: Payer: Self-pay

## 2020-01-11 NOTE — Telephone Encounter (Signed)
Patient's mother called to request advise of how to treat patient's continued fever. Caller not with patient at this time due to covid positive patient. Patient 's mother reports patient has been running a fever of 100.2 , up and down at times since Friday. C/o daughter having SOB, body aches, fatigue, poor appetite. Patient's mother denies patient c/o difficulty breathing, coughing for patient. Patient's mother reports patient has been hydrated and is pushing fluids. Patient's mother reports she has been taking Tylenol as directed and continues to report a fever. Patient's mother reports patient continues to take OTC medications , DayQuil and Nyquil. Care advise given. Patient's mother verbalized understanding of care advise and to call back or go to [facility] or ED if symptoms worsen.    Reason for Disposition . [1] PERSISTING SYMPTOMS OF COVID-19 AND [2] symptoms WORSE  Answer Assessment - Initial Assessment Questions 1. COVID-19 ONSET: "When did the symptoms of COVID-19 first start?"     Last Friday 2. DIAGNOSIS CONFIRMATION: "How were you diagnosed?" (e.g., COVID-19 oral or nasal viral test; COVID-19 antibody test; doctor visit)     covid test  3. MAIN SYMPTOM:  "What is your main concern or symptom right now?" (e.g., breathing difficulty, cough, fatigue. loss of smell)     Fever, body aches 4. SYMPTOM ONSET: "When did the  fever  start?"     Friday 01/05/20 5. BETTER-SAME-WORSE: "Are you getting better, staying the same, or getting worse over the last 1 to 2 weeks?"     Same or worse  6. RECENT MEDICAL VISIT: "Have you been seen by a healthcare provider (doctor, NP, PA) for these persisting COVID-19 symptoms?" If Yes, ask: "When were you seen?" (e.g., date)     no 7. COUGH: "Do you have a cough?" If Yes, ask: "How bad is the cough?"       no 8. FEVER: "Do you have a fever?" If Yes, ask: "What is your temperature, how was it measured, and when did it start?"     Yes 100.2 9. BREATHING DIFFICULTY:  "Are you having any trouble breathing?" If Yes, ask: "How bad is your breathing?" (e.g., mild, moderate, severe)    - MILD: No SOB at rest, mild SOB with walking, speaks normally in sentences, can lie down, no retractions, pulse < 100.    - MODERATE: SOB at rest, SOB with minimal exertion and prefers to sit, cannot lie down flat, speaks in phrases, mild retractions, audible wheezing, pulse 100-120.    - SEVERE: Very SOB at rest, speaks in single words, struggling to breathe, sitting hunched forward, retractions, pulse > 120       Mild - moderate  10. HIGH RISK DISEASE: "Do you have any chronic medical problems?" (e.g., asthma, heart or lung disease, weak immune system, obesity, etc.)       SOB, mild to moderate  11. VACCINE: "Have you gotten the COVID-19 vaccine?" If Yes ask: "Which one, how many shots, when did you get it?"       no 12. PREGNANCY: "Is there any chance you are pregnant?" "When was your last menstrual period?"       No  13. OTHER SYMPTOMS: "Do you have any other symptoms?"  (e.g., fatigue, headache, muscle pain, weakness)       Fatigue, SOB , fever, poor appetite body aches  Protocols used: CORONAVIRUS (COVID-19) PERSISTING SYMPTOMS FOLLOW-UP CALL-A-AH

## 2020-01-11 NOTE — Telephone Encounter (Signed)
Patient called and states that she had virtual visit this week with PCP befor test result was known.  She is calling because she has received her test result and she is positive for COVID-19  She states that her fever is her concern.  She states it will go away and then return when medication wears off.  Alternation of tylenol with Ibuprofen was discussed.  She was told to drink plenty of fluid and rest.  She is taking cold med with tylenol  She was warned to not take tylenol if it is contained in cold medication. She was told to seek medical care at [facility] or ER if fever rises to 103.  She has no breathing issues just mild cough. She verbalized understanding of all information.  She was encouraged to call office on Monday for another virtual visit with PCP if symptoms do not improve or fever persist.  She verbalized understanding of all information.  Reason for Disposition . [1] COVID-19 infection suspected by caller or triager AND [2] mild symptoms (cough, fever, or others) AND [3] has not gotten tested yet  Answer Assessment - Initial Assessment Questions 1. COVID-19 DIAGNOSIS: "Who made your COVID-19 diagnosis?" "Was it confirmed by a positive lab test?" If not diagnosed by a HCP, ask "Are there lots of cases (community spread) where you live?" Note: See public health department website, if unsure.     yes 2. COVID-19 EXPOSURE: "Was there any known exposure to COVID before the symptoms began?" CDC Definition of close contact: within 6 feet (2 meters) for a total of 15 minutes or more over a 24-hour period.     Tested positive Monday 3. ONSET: "When did the COVID-19 symptoms start?"      Friday 4. WORST SYMPTOM: "What is your worst symptom?" (e.g., cough, fever, shortness of breath, muscle aches)    Fever hot and cold flashes 5. COUGH: "Do you have a cough?" If Yes, ask: "How bad is the cough?"      Cough comes and goes 6. FEVER: "Do you have a fever?" If Yes, ask: "What is your temperature, how was  it measured, and when did it start?"     102.3 at highest 101.3 7. RESPIRATORY STATUS: "Describe your breathing?" (e.g., shortness of breath, wheezing, unable to speak)      No SOB 8. BETTER-SAME-WORSE: "Are you getting better, staying the same or getting worse compared to yesterday?"  If getting worse, ask, "In what way?"     Same 9. HIGH RISK DISEASE: "Do you have any chronic medical problems?" (e.g., asthma, heart or lung disease, weak immune system, obesity, etc.)    no 10. VACCINE: "Have you gotten the COVID-19 vaccine?" If Yes ask: "Which one, how many shots, when did you get it?"      no 11. PREGNANCY: "Is there any chance you are pregnant?" "When was your last menstrual period?"       No period 12/21/19 12. OTHER SYMPTOMS: "Do you have any other symptoms?"  (e.g., chills, fatigue, headache, loss of smell or taste, muscle pain, sore throat; new loss of smell or taste especially support the diagnosis of COVID-19)       Chills, headache body aches  Protocols used: CORONAVIRUS (COVID-19) DIAGNOSED OR SUSPECTED-A-AH

## 2020-01-14 ENCOUNTER — Emergency Department: Payer: 59

## 2020-01-14 ENCOUNTER — Encounter: Payer: Self-pay | Admitting: Emergency Medicine

## 2020-01-14 ENCOUNTER — Other Ambulatory Visit: Payer: Self-pay

## 2020-01-14 ENCOUNTER — Emergency Department
Admission: EM | Admit: 2020-01-14 | Discharge: 2020-01-14 | Disposition: A | Discharge status: Home / Self Care | Payer: 59 | Attending: Emergency Medicine | Admitting: Emergency Medicine

## 2020-01-14 DIAGNOSIS — R0602 Shortness of breath: Secondary | ICD-10-CM

## 2020-01-14 DIAGNOSIS — J129 Viral pneumonia, unspecified: Secondary | ICD-10-CM

## 2020-01-14 DIAGNOSIS — U071 COVID-19: Secondary | ICD-10-CM | POA: Insufficient documentation

## 2020-01-14 DIAGNOSIS — J1282 Pneumonia due to coronavirus disease 2019: Secondary | ICD-10-CM | POA: Insufficient documentation

## 2020-01-14 DIAGNOSIS — B342 Coronavirus infection, unspecified: Secondary | ICD-10-CM

## 2020-01-14 MED ORDER — METHYLPREDNISOLONE 4 MG PO TBPK: ORAL_TABLET | ORAL | 0 refills | Status: DC

## 2020-01-14 MED ORDER — BENZONATATE 100 MG PO CAPS: 100.0000 mg | ORAL_CAPSULE | Freq: Four times a day (QID) | ORAL | 0 refills | Status: DC | PRN

## 2020-01-14 NOTE — ED Notes (Signed)
Lab here to draw labs from pt, pt refusing at this time. Tiffany, RN made aware.

## 2020-01-14 NOTE — ED Notes (Signed)
Patient maintained at 95% while ambulating in the room. Patient denies any additional discomfort. Respirations remained even, patient spoke in complete sentences without pauses. Dr. Vicente Males aware. Patient continues to decline labs due to multiple attemptsl

## 2020-01-14 NOTE — ED Provider Notes (Addendum)
[facility] Emergency Department Provider Note   ____________________________________________   Event Date/Time   First MD Initiated Contact with Patient 01/14/20 1504     (approximate)  I have reviewed the triage vital signs and the nursing notes.   HISTORY  Chief Complaint Fever and Shortness of Breath    HPI Claire Walker is a 24 y.o. female with no stated past medical history who presents after 9 days of Covid symptoms including shortness of breath, body aches, cough, and pleuritic chest pain.  Patient states that she was diagnosed positive with Covid approximately 1 week ago symptoms have been worsening since then.  Patient states that they were symptom at this point as her shortness of breath and dyspnea on exertion.  Patient has been using Tylenol and ibuprofen to deal with any fevers body aches.  Patient also endorses associated diarrhea.  Patient currently denies any vision changes, tinnitus, difficulty speaking, facial droop, sore throat, abdominal pain, nausea/vomiting, dysuria, or weakness/numbness/paresthesias in any extremity         Past Medical History:  Diagnosis Date  . Gestational hypertension   . Morbid obesity with BMI of 45.0-49.9, adult (HCC) 04/07/2019  . Normal vaginal delivery 02/26/2019  . Obesity affecting pregnancy 01/11/2019  . Supervision of high risk pregnancy, antepartum 01/11/2019   [facility] Prenatal Labs Dating 32 week ultrasoud Blood type: O/Positive/-- (12/16 1532)  Genetic Screen Late onet, not done Antibody:Negative (12/16 1532) Anatomic [place] Normal, anterior placenta Rubella: 2.16 (12/16 1532) Varicella:Immune GTT Third trimester: Hemoglobin A1C 5.1% RPR: Non Reactive (12/16 1532)  Rhogam n/a HBsAg: Negative (12/16 1532)  TDaP vaccine      01/25/19                HIV    Patient Active Problem List   Diagnosis Date Noted  . URI (upper respiratory infection) 01/09/2020  . Gestational hypertension 02/24/2019  . BMI  45.0-49.9, adult (HCC) 01/11/2019  . Depression, major, single episode, severe (HCC) 04/07/2017  . Allergic rhinitis 10/11/2014    Past Surgical History:  Procedure Laterality Date  . NO PAST SURGERIES      Prior to Admission medications   Medication Sig Start Date End Date Taking? Authorizing Provider  benzonatate (TESSALON PERLES) 100 MG capsule Take 1 capsule (100 mg total) by mouth every 6 (six) hours as needed for cough. 01/14/20 01/13/21  Merwyn Katos, MD  methylPREDNISolone (MEDROL DOSEPAK) 4 MG TBPK tablet Follow instructions on package for taper dosage 01/14/20   Merwyn Katos, MD  Naltrexone-buPROPion HCl ER (CONTRAVE) 8-90 MG TB12 Start 1 tablet every morning for 7 days, then 1 tablet twice daily for 7 days, then 2 tablets every morning and one every evening, then 2 tabs twice daily Patient not taking: Reported on 08/07/2019 06/27/19   Particia Nearing, PA-C  norethindrone-ethinyl estradiol (JUNEL FE 1/20) 1-20 MG-MCG tablet Take 1 tablet by mouth daily. 06/27/19   Particia Nearing, PA-C  sertraline (ZOLOFT) 100 MG tablet Take 1 tablet (100 mg total) by mouth daily. Take alongside 50 mg zoloft tablet 12/21/19   Cathlean Marseilles A, NP  sertraline (ZOLOFT) 50 MG tablet TAKE 1 TABLET (50 MG TOTAL) BY MOUTH DAILY. TAKE ALONGSIDE 100 MG ZOLOFT TABLET 12/21/19   Valentino Nose, NP    Allergies Patient has no known allergies.  Family History  Problem Relation Age of Onset  . Arthritis Mother   . Asthma Mother   . Hypertension Mother   . Thyroid  disease Mother   . Diabetes Father   . Hypertension Maternal Grandmother   . Cancer Maternal Grandfather        squamous cell carcinoma  . Hypertension Maternal Grandfather   . Diabetes Maternal Grandfather   . Hypertension Paternal Grandmother   . Hypertension Paternal Grandfather     Social History Social History   Tobacco Use  . Smoking status: Never Smoker  . Smokeless tobacco: Never Used  Vaping Use  .  Vaping Use: Never used  Substance Use Topics  . Alcohol use: No    Alcohol/week: 0.0 standard drinks  . Drug use: No    Review of Systems Constitutional: Endorses fever/chills Eyes: No visual changes. ENT: No sore throat. Cardiovascular: Endorses chest pain. Respiratory: Endorses shortness of breath. Gastrointestinal: No abdominal pain.  No nausea, no vomiting.  Endorses diarrhea. Genitourinary: Negative for dysuria. Musculoskeletal: Positive for generalized arthralgias Skin: Negative for rash. Neurological: Positive for headaches, denies numbness/paresthesias in any extremity Psychiatric: Negative for suicidal ideation/homicidal ideation   ____________________________________________   PHYSICAL EXAM:  VITAL SIGNS: ED Triage Vitals  Enc Vitals Group     BP 01/14/20 1107 112/82     Pulse Rate 01/14/20 1107 91     Resp 01/14/20 1107 18     Temp 01/14/20 1107 100.3 F (37.9 C)     Temp Source 01/14/20 1107 Oral     SpO2 01/14/20 1107 93 %     Weight 01/14/20 1108 (!) 347 lb (157.4 kg)     Height 01/14/20 1108 6' (1.829 m)     Head Circumference --      Peak Flow --      Pain Score 01/14/20 1107 7     Pain Loc --      Pain Edu? --      Excl. in GC? --    Constitutional: Alert and oriented. Well appearing and in no acute distress. Eyes: Conjunctivae are normal. PERRL. Head: Atraumatic. Nose: No congestion/rhinnorhea. Mouth/Throat: Mucous membranes are moist. Neck: No stridor Cardiovascular: Grossly normal heart sounds.  Good peripheral circulation. Respiratory: Normal respiratory effort but increased rate.  No retractions. Gastrointestinal: Soft and nontender. No distention. Musculoskeletal: No obvious deformities Neurologic:  Normal speech and language. No gross focal neurologic deficits are appreciated. Skin:  Skin is warm and dry. No rash noted. Psychiatric: Mood and affect are normal. Speech and behavior are  normal.  ____________________________________________   LABS (all labs ordered are listed, but only abnormal results are displayed)  Labs Reviewed  BASIC METABOLIC PANEL  CBC   RADIOLOGY  ED MD interpretation: 2 view x-ray of the chest shows multifocal lower lung predominant patchy interstitial opacities consistent with viral pneumonia  Official radiology report(s): DG Chest 2 View  Result Date: 01/14/2020 CLINICAL DATA:  Cough. Fever. Shortness of breath. Diagnosed with COVID-19 on 12/20. Nonsmoker. EXAM: CHEST - 2 VIEW COMPARISON:  None. FINDINGS: Midline trachea. Normal heart size and mediastinal contours. No pleural effusion or pneumothorax. Basilar predominant interstitial opacities bilaterally. IMPRESSION: Multifocal lower lung predominant COVID-19 pneumonia. Electronically Signed   By: Jeronimo Greaves M.D.   On: 01/14/2020 12:22    ____________________________________________   PROCEDURES  Procedure(s) performed (including Critical Care):  .1-3 Lead EKG Interpretation Performed by: Merwyn Katos, MD Authorized by: Merwyn Katos, MD     Interpretation: normal     ECG rate:  93   ECG rate assessment: normal     Rhythm: sinus rhythm     Ectopy: none  Conduction: normal       ____________________________________________   INITIAL IMPRESSION / ASSESSMENT AND PLAN / ED COURSE  As part of my medical decision making, I reviewed the following data within the electronic MEDICAL RECORD NUMBER Nursing notes reviewed and incorporated, Labs reviewed, Old chart reviewed, Radiograph reviewed and Notes from prior ED visits reviewed and incorporated        Presentation most consistent with Viral Syndrome.  Patient has tested positive for COVID-19. Based on vitals and exam they are nontoxic and stable for discharge.  Patient maintained adequate oxygenation and with pulse oximetry > 88% including during a trial of ambulation.  Given History and Exam I have a lower suspicion  for: Emergent CardioPulmonary causes [such as Acute Asthma or COPD Exacerbation, acute Heart Failure or exacerbation, PE, PTX, atypical ACS, PNA]. Emergent Otolaryngeal causes [such as PTA, RPA, Ludwigs, Epiglottitis, EBV].  Regarding Emergent Travel or Immunosuppressive related infectious: I have a low suspicion for acute HIV.  Will provide strict return precautions and instructions on self-isolation/quarantine and anticipatory guidance.      ____________________________________________   FINAL CLINICAL IMPRESSION(S) / ED DIAGNOSES  Final diagnoses:  Shortness of breath  Coronavirus infection  Viral pneumonia     ED Discharge Orders         Ordered    methylPREDNISolone (MEDROL DOSEPAK) 4 MG TBPK tablet        01/14/20 1533    benzonatate (TESSALON PERLES) 100 MG capsule  Every 6 hours PRN        01/14/20 1533           Note:  This document was prepared using Dragon voice recognition software and may include unintentional dictation errors.   Merwyn Katos, MD 01/14/20 1534    Merwyn Katos, MD 01/14/20 1534

## 2020-01-14 NOTE — ED Triage Notes (Signed)
Pt to ER states she tested positive for Covid last Monday and continues to have fever, SHOB and body aches.  Pt states last took anything for fever yesterday.  States she did not want to poison her system with so much tylenol and ibuprofen.  Pt speaking in complete uninterrupted sentences.  NAD noted, VSS.

## 2020-01-15 NOTE — Telephone Encounter (Signed)
Recommend patient visit at ER today due to SOB reported below and ongoing fever + poor appetite.  May benefit from imaging, labs, and IV hydration at this time.

## 2020-01-15 NOTE — Telephone Encounter (Signed)
Pt reports going to ED last night and does feel better. Pt stated she would call us if she needs [place].

## 2020-01-15 NOTE — Telephone Encounter (Signed)
Noted, thank you

## 2020-01-16 ENCOUNTER — Other Ambulatory Visit: Payer: Self-pay | Admitting: Nurse Practitioner

## 2020-01-16 ENCOUNTER — Ambulatory Visit: Payer: Self-pay

## 2020-01-16 ENCOUNTER — Encounter: Payer: Self-pay | Admitting: Nurse Practitioner

## 2020-01-16 MED ORDER — METHYLPREDNISOLONE 4 MG PO TBPK: ORAL_TABLET | ORAL | 0 refills | Status: DC

## 2020-01-16 NOTE — Telephone Encounter (Signed)
Patient called back needed her medication for her SOB.  She was informed that medication was sent to [facility].  She verbalized understaning.  She states that she is not SOB when resting but with movement she does become SOB.  She states she was feeling better but her medication was accidently taken to the trash.  She has already been triaged and Aura Dials NP has sent additional medication.  She was informed that if symptoms worsen or are not improved by medication, she develops chest pain to seek help in ER.  She verbalized understanding.  Answer Assessment - Initial Assessment Questions 1. RESPIRATORY STATUS: "Describe your breathing?" (e.g., wheezing, shortness of breath, unable to speak, severe coughing)      When at rest not bad 2. ONSET: "When did this breathing problem begin?"      COVID-19 last monday 3. PATTERN "Does the difficult breathing come and go, or has it been constant since it started?"      No 4. SEVERITY: "How bad is your breathing?" (e.g., mild, moderate, severe)    - MILD: No SOB at rest, mild SOB with walking, speaks normally in sentences, can lay down, no retractions, pulse < 100.    - MODERATE: SOB at rest, SOB with minimal exertion and prefers to sit, cannot lie down flat, speaks in phrases, mild retractions, audible wheezing, pulse 100-120.    - SEVERE: Very SOB at rest, speaks in single words, struggling to breathe, sitting hunched forward, retractions, pulse > 120      moderate 5. RECURRENT SYMPTOM: "Have you had difficulty breathing before?" If Yes, ask: "When was the last time?" and "What happened that time?"     Steroid dose 6. CARDIAC HISTORY: "Do you have any history of heart disease?" (e.g., heart attack, angina, bypass surgery, angioplasty)     no 7. LUNG HISTORY: "Do you have any history of lung disease?"  (e.g., pulmonary embolus, asthma, emphysema)     no 8. CAUSE: "What do you think is causing the breathing problem?"     COVID-19 positive 9. OTHER  SYMPTOMS: "Do you have any other symptoms? (e.g., dizziness, runny nose, cough, chest pain, fever)     Cough, tighness in chest 10. PREGNANCY: "Is there any chance you are pregnant?" "When was your last menstrual period?"       No period 12 days ago 11. TRAVEL: "Have you traveled out of the country in the last month?" (e.g., travel history, exposures)      N/A  Protocols used: BREATHING DIFFICULTY-A-AH

## 2020-01-16 NOTE — Telephone Encounter (Signed)
I have sent this in for patient.  Thank you.

## 2020-01-16 NOTE — Telephone Encounter (Signed)
Pt. Seen in ED 01/14/20 and given Pred pack and Tessalon perles. States her fiance inadvertently threw away her Prednisone. Request a new prescription be sent to her pharmacy. Please advise pt.

## 2020-01-16 NOTE — Telephone Encounter (Signed)
Pt made aware medication was sent.

## 2020-01-18 ENCOUNTER — Telehealth: Payer: Self-pay | Admitting: Family Medicine

## 2020-01-18 ENCOUNTER — Ambulatory Visit: Payer: Self-pay | Admitting: *Deleted

## 2020-01-18 NOTE — Telephone Encounter (Signed)
Please call and check on patient Monday.  Agree with need for ER due to current symptoms and Covid +.

## 2020-01-18 NOTE — Telephone Encounter (Signed)
Please advise if provider will approve another work note.

## 2020-01-18 NOTE — Telephone Encounter (Signed)
Can we write her another work note?

## 2020-01-18 NOTE — Telephone Encounter (Signed)
Patient called stated she had been out of work due to COVID and was set to return today (12/30), patient is requesting another note to extend her time out of work still isn't feeling well anticipates to return to work Jan 4th 2022, Dr.Rumball wrote patient a note on (12/21) and ED wrote her a note on (12/26)

## 2020-01-18 NOTE — Telephone Encounter (Signed)
Pt's mother called in concerned about her daughter being so short of breath.  She is covid positive. She was put on prednisone and only has 2 days left on the prednisone but is no better.   (Mother is not with daughter for this call but is very upset and crying).  Daughter's boyfriend keeps saying,  "I'll take care of her".   Mother feels daughter needs to go to the hospital because of the shortness of breath.  She is going to her daughter's house now to see if the boyfriend will take her to the hospital or at least her mother take daughter to the ED.  She agreed to call the police if necessary but she doesn't want to make the boyfriend mad.  After talking with mother and hearing the symptoms I advised her mother daughter needed to go to the ED either by EMS or someone taking her.   Mother agreed.    Reason for Disposition . [1] MODERATE difficulty breathing (e.g., speaks in phrases, SOB even at rest, pulse 100-120) AND [2] NEW-onset or WORSE than normal  Answer Assessment - Initial Assessment Questions 1. RESPIRATORY STATUS: "Describe your breathing?" (e.g., wheezing, shortness of breath, unable to speak, severe coughing)      Mother called in for daughter she is asleep. Also mother is not with daughter for this call.   Daughter is on Prednisone but she is still very short of breath.  " I'm worried about her."  Daughter only has 2 more days of the prednisone but she is still very short of breath. They thought about admitting her to the hospital but tried the prednisone first. 2. ONSET: "When did this breathing problem begin?"      It's been 2 weeks since this started.    3. PATTERN "Does the difficult breathing come and go, or has it been constant since it started?"      Constant.  She is moving so slow and very winded.   "I'm so worried about her". 4. SEVERITY: "How bad is your breathing?" (e.g., mild, moderate, severe)    - MILD: No SOB at rest, mild SOB with walking, speaks normally in  sentences, can lay down, no retractions, pulse < 100.    - MODERATE: SOB at rest, SOB with minimal exertion and prefers to sit, cannot lie down flat, speaks in phrases, mild retractions, audible wheezing, pulse 100-120.    - SEVERE: Very SOB at rest, speaks in single words, struggling to breathe, sitting hunched forward, retractions, pulse > 120      Short of breath all the time. 5. RECURRENT SYMPTOM: "Have you had difficulty breathing before?" If Yes, ask: "When was the last time?" and "What happened that time?"      No 6. CARDIAC HISTORY: "Do you have any history of heart disease?" (e.g., heart attack, angina, bypass surgery, angioplasty)      No 7. LUNG HISTORY: "Do you have any history of lung disease?"  (e.g., pulmonary embolus, asthma, emphysema)     No 8. CAUSE: "What do you think is causing the breathing problem?"      She has covid.   covid positive. 9. OTHER SYMPTOMS: "Do you have any other symptoms? (e.g., dizziness, runny nose, cough, chest pain, fever)     Daughter's boyfriend says "he will take care of her". Mother upset and crying because boyfriend won't let daughter go to the hospital.  Mother is not with her daughter during this call.  He is going to  take care of her.   Mother crying and can hardly talk because she is so upset.   The daughter's boyfriend is black and she is white not that that has anything to do with it but I don't understand why he won't let me take her to the hospital.   He always tells me to call before I come to their house which I always do but my daughter is not answering her phone which is very unusual for her.   I'm just so worried about her and her breathing and that the prednisone has not helped her. I instructed her to call the police if she feels the boyfriend is endangering her daughter's life by not allowing her to get medical treatment.   I allowed the mother to talk and tried to give reassurance as she was so upset and crying in regards to her  daughter's health and being so short of breath. She decided to go up there and see if the boyfriend will allow mother to take daughter to the hospital and if he doesn't she is going to call the police.  Mother mentioned she talked with one of our nurses a couple of days ago about this same issue with her daughter being short of breath.  I have forwarded my notes to [facility] for their information.  10. PREGNANCY: "Is there any chance you are pregnant?" "When was your last menstrual period?"       Not asked 11. TRAVEL: "Have you traveled out of the country in the last month?" (e.g., travel history, exposures)       No  Protocols used: BREATHING DIFFICULTY-A-AH

## 2020-01-19 NOTE — Telephone Encounter (Signed)
Note provided and sent via MyChart. If she requires further time from work, she will need an appointment.

## 2020-01-22 NOTE — Telephone Encounter (Signed)
Looks like patient went to ER on 01/14/20

## 2020-01-22 NOTE — Telephone Encounter (Signed)
Will need ER follow-up scheduled, may need virtual is still not feeling well and still on quarantine time.

## 2020-01-22 NOTE — Telephone Encounter (Signed)
Called patient, no answer-left message.

## 2020-01-22 NOTE — Telephone Encounter (Signed)
Lvm to make this hosp. Fu apt  

## 2020-01-23 NOTE — Telephone Encounter (Signed)
Lvm to make this hosp. Fu apt

## 2020-01-24 NOTE — Telephone Encounter (Signed)
Lvm to make this apt. 

## 2020-03-28 ENCOUNTER — Telehealth: Payer: Self-pay | Admitting: Nurse Practitioner

## 2020-03-28 NOTE — Progress Notes (Deleted)
   There were no vitals taken for this visit.   Subjective:    Patient ID: Claire Walker, female    DOB: [birth year], 25 y.o.   MRN: [id number]  HPI: Claire Walker is a 25 y.o. female  No chief complaint on file.    Relevant past medical, surgical, family and social history reviewed and updated as indicated. Interim medical history since our last visit reviewed. Allergies and medications reviewed and updated.  Review of Systems  Per HPI unless specifically indicated above     Objective:    There were no vitals taken for this visit.  Wt Readings from Last 3 Encounters:  01/14/20 (!) 347 lb (157.4 kg)  09/04/19 (!) 334 lb (151.5 kg)  08/07/19 (!) 340 lb (154.2 kg)    Physical Exam  Results for orders placed or performed in visit on 01/08/20  Novel Coronavirus, NAA ([facility])   Specimen: Nasopharyngeal(NP) swabs in vial transport medium   Nasopharynge  Result Value Ref Range   SARS-CoV-2, NAA Detected (A) Not Detected  SARS-COV-2, NAA 2 DAY TAT   Nasopharynge  Result Value Ref Range   SARS-CoV-2, NAA 2 DAY TAT Performed       Assessment & Plan:   Problem List Items Addressed This Visit   None      Follow up plan: No follow-ups on file.    This visit was completed via MyChart due to the restrictions of the COVID-19 pandemic. All issues as above were discussed and addressed. Physical exam was done as above through visual confirmation on MyChart. If it was felt that the patient should be evaluated in the office, they were directed there. The patient verbally consented to this visit. 1. Location of the patient: Home 2. Location of the provider: Office 3. Those involved with this call:  ? Provider: Larae Grooms, NP ? CMA: Tiffany Reel, CMA ? Front Desk/Registration: Harriet Pho 4. Time spent on call: *** minutes with patient face to face via video conference. More than 50% of this time was spent in counseling and coordination of care. *** minutes total spent in  review of patient's record and preparation of their chart.

## 2020-03-29 ENCOUNTER — Other Ambulatory Visit: Payer: Self-pay

## 2020-03-29 ENCOUNTER — Telehealth: Payer: 59 | Admitting: Nurse Practitioner

## 2020-03-29 ENCOUNTER — Telehealth: Payer: Self-pay

## 2020-03-29 NOTE — Telephone Encounter (Signed)
Called patient two more times to check patient in for her 340 virtual appt. With no answer, unable to leave vm, vm not set up

## 2020-03-29 NOTE — Telephone Encounter (Signed)
Called patient twice today to get her checked in for her virtual appointment, unable to leave vm since voice mail is not set up yet. Will try again .

## 2020-04-01 ENCOUNTER — Telehealth (INDEPENDENT_AMBULATORY_CARE_PROVIDER_SITE_OTHER): Payer: 59 | Admitting: Nurse Practitioner

## 2020-04-01 ENCOUNTER — Encounter: Payer: Self-pay | Admitting: Nurse Practitioner

## 2020-04-01 ENCOUNTER — Other Ambulatory Visit: Payer: Self-pay

## 2020-04-01 DIAGNOSIS — F322 Major depressive disorder, single episode, severe without psychotic features: Secondary | ICD-10-CM | POA: Diagnosis not present

## 2020-04-01 MED ORDER — SERTRALINE HCL 100 MG PO TABS
200.0000 mg | ORAL_TABLET | Freq: Every day | ORAL | 1 refills | Status: DC
Start: 2020-04-01 — End: 2020-12-09

## 2020-04-01 NOTE — Progress Notes (Addendum)
There were no vitals taken for this visit.   Subjective:    Patient ID: Claire Walker, female    DOB: 10-26-1995, 25 y.o.   MRN: [id number]  HPI: [name] is a 25 y.o. female  Chief Complaint  Patient presents with  . Depression    Patient states that she had a melt down last week and has not been able to return yet. She states that she has a really hard time concentrating, fatigued, unable to prioritize at work, hard to complete simple task.   Marland Kitchen Anxiety    DEPRESSION/ ANXIETY Last Tuesday patient states she had a "melt down" from frustration.  She feels like she can't focus.  Patient states that since she had her son 1 year ago she has felt like a failure.  She has had a temper.  Her patience is very thin. Normal everyday tasks seem so difficult to complete. Patient has tried Sertraline.   Depression screen [facility] 2/9 04/01/2020 08/03/2019 06/27/2019 06/15/2018 04/27/2017  Decreased Interest 3 3 3 1  0  Down, Depressed, Hopeless 2 3 3 1  0  PHQ - 2 Score 5 6 6 2  0  Altered sleeping 3 3 3 1  0  Tired, decreased energy 3 3 3 1  0  Change in appetite 3 1 3 3  0  Feeling bad or failure about yourself  2 3 3 1  0  Trouble concentrating 3 3 3  0 0  Moving slowly or fidgety/restless 3 3 3  0 0  Suicidal thoughts 0 0 0 0 0  PHQ-9 Score 22 22 24 8  0  Difficult doing work/chores Extremely dIfficult Extremely dIfficult Extremely dIfficult Not difficult at all -   GAD 7 : Generalized Anxiety Score 04/01/2020 06/27/2019 06/15/2018  Nervous, Anxious, on Edge 3 3 0  Control/stop worrying 3 3 1   Worry too much - different things 2 3 1   Trouble relaxing 3 3 1   Restless 0 0 1  Easily annoyed or irritable 3 3 0  Afraid - awful might happen 1 2 0  Total GAD 7 Score 15 17 4   Anxiety Difficulty Extremely difficult Extremely difficult Not difficult at all     Relevant past medical, surgical, family and social history reviewed and updated as indicated. Interim medical history since our last visit  reviewed. Allergies and medications reviewed and updated.  Review of Systems  Psychiatric/Behavioral: Positive for dysphoric mood. Negative for suicidal ideas. The patient is nervous/anxious.     Per HPI unless specifically indicated above     Objective:    There were no vitals taken for this visit.  Wt Readings from Last 3 Encounters:  01/14/20 (!) 347 lb (157.4 kg)  09/04/19 (!) 334 lb (151.5 kg)  08/07/19 (!) 340 lb (154.2 kg)    Physical Exam Vitals and nursing note reviewed.  HENT:     Head: Normocephalic.     Right Ear: Hearing normal.     Left Ear: Hearing normal.     Nose: Nose normal.  Eyes:     Pupils: Pupils are equal, round, and reactive to light.  Pulmonary:     Effort: Pulmonary effort is normal. No respiratory distress.  Neurological:     Mental Status: She is alert.  Psychiatric:        Mood and Affect: Mood normal.        Behavior: Behavior normal.        Thought Content: Thought content normal.        Judgment: Judgment normal.  Results for orders placed or performed in visit on 01/08/20  Novel Coronavirus, NAA ([facility])   Specimen: Nasopharyngeal(NP) swabs in vial transport medium   Nasopharynge  Result Value Ref Range   SARS-CoV-2, NAA Detected (A) Not Detected  SARS-COV-2, NAA 2 DAY TAT   Nasopharynge  Result Value Ref Range   SARS-CoV-2, NAA 2 DAY TAT Performed       Assessment & Plan:   Problem List Items Addressed This Visit      Other   Depression, major, single episode, severe (HCC)    Chronic.  Uncontrolled.  Patient has not been taking her Zoloft daily.  When she is taking it, she is only doing 100mg .  Discussed with patient the importance of taking medication daily.  Increased dose to 200mg .  Discussed starting a routine to make sure she is taking it daily.  Letter written for patient to take time off work.  No SI. Return in 1 week.      Relevant Medications   sertraline (ZOLOFT) 100 MG tablet       Follow up plan: No  follow-ups on file.    This visit was completed via MyChart due to the restrictions of the COVID-19 pandemic. All issues as above were discussed and addressed. Physical exam was done as above through visual confirmation on MyChart. If it was felt that the patient should be evaluated in the office, they were directed there. The patient verbally consented to this visit. 1. Location of the patient: Home 2. Location of the provider: Office 3. Those involved with this call:  ? Provider: , NP ? CMA: Tiffany Reel, CMA ? Front Desk/Registration: 4. Time spent on call: 20 minutes with patient face to face via video conference. More than 50% of this time was spent in counseling and coordination of care. 30 minutes total spent in review of patient's record and preparation of their chart.

## 2020-04-02 ENCOUNTER — Encounter: Payer: Self-pay | Admitting: Nurse Practitioner

## 2020-04-02 NOTE — Assessment & Plan Note (Signed)
Chronic.  Uncontrolled.  Patient has not been taking her Zoloft daily.  When she is taking it, she is only doing 100mg .  Discussed with patient the importance of taking medication daily.  Increased dose to 200mg .  Discussed starting a routine to make sure she is taking it daily.  Letter written for patient to take time off work.  No SI. Return in 1 week.

## 2020-04-08 ENCOUNTER — Telehealth: Payer: Self-pay

## 2020-04-08 NOTE — Telephone Encounter (Signed)
Received fax from [employer] group have faxed all that was requested and what we had to the fax [phone] via interface system secure fax

## 2020-05-16 NOTE — Progress Notes (Deleted)
   There were no vitals taken for this visit.   Subjective:    Patient ID: Claire Walker, female    DOB: 1995-09-28, 25 y.o.   MRN: [id number]  HPI: Claire Walker is a 25 y.o. female  No chief complaint on file.  ANXIETY/STRESS Duration:{Blank single:19197::"controlled","uncontrolled","better","worse","exacerbated","stable"} Anxious mood: {Blank single:19197::"yes","no"}  Excessive worrying: {Blank single:19197::"yes","no"} Irritability: {Blank single:19197::"yes","no"}  Sweating: {Blank single:19197::"yes","no"} Nausea: {Blank single:19197::"yes","no"} Palpitations:{Blank single:19197::"yes","no"} Hyperventilation: {Blank single:19197::"yes","no"} Panic attacks: {Blank single:19197::"yes","no"} Agoraphobia: {Blank single:19197::"yes","no"}  Obscessions/compulsions: {Blank single:19197::"yes","no"} Depressed mood: {Blank single:19197::"yes","no"} Depression screen [facility] 2/9 04/01/2020 08/03/2019 06/27/2019 06/15/2018 04/27/2017  Decreased Interest 3 3 3 1  0  Down, Depressed, Hopeless 2 3 3 1  0  PHQ - 2 Score 5 6 6 2  0  Altered sleeping 3 3 3 1  0  Tired, decreased energy 3 3 3 1  0  Change in appetite 3 1 3 3  0  Feeling bad or failure about yourself  2 3 3 1  0  Trouble concentrating 3 3 3  0 0  Moving slowly or fidgety/restless 3 3 3  0 0  Suicidal thoughts 0 0 0 0 0  PHQ-9 Score 22 22 24 8  0  Difficult doing work/chores Extremely dIfficult Extremely dIfficult Extremely dIfficult Not difficult at all -   Anhedonia: {Blank single:19197::"yes","no"} Weight changes: {Blank single:19197::"yes","no"} Insomnia: {Blank single:19197::"yes","no"} {Blank single:19197::"hard to fall asleep","hard to stay asleep"}  Hypersomnia: {Blank single:19197::"yes","no"} Fatigue/loss of energy: {Blank single:19197::"yes","no"} Feelings of worthlessness: {Blank single:19197::"yes","no"} Feelings of guilt: {Blank single:19197::"yes","no"} Impaired concentration/indecisiveness: {Blank  single:19197::"yes","no"} Suicidal ideations: {Blank single:19197::"yes","no"}  Crying spells: {Blank single:19197::"yes","no"} Recent Stressors/Life Changes: {Blank single:19197::"yes","no"}   Relationship problems: {Blank single:19197::"yes","no"}   Family stress: {Blank single:19197::"yes","no"}     Financial stress: {Blank single:19197::"yes","no"}    Job stress: {Blank single:19197::"yes","no"}    Recent death/loss: {Blank single:19197::"yes","no"}   Relevant past medical, surgical, family and social history reviewed and updated as indicated. Interim medical history since our last visit reviewed. Allergies and medications reviewed and updated.  Review of Systems  Per HPI unless specifically indicated above     Objective:    There were no vitals taken for this visit.  Wt Readings from Last 3 Encounters:  01/14/20 (!) 347 lb (157.4 kg)  09/04/19 (!) 334 lb (151.5 kg)  08/07/19 (!) 340 lb (154.2 kg)    Physical Exam  Results for orders placed or performed in visit on 01/08/20  Novel Coronavirus, NAA ([facility])   Specimen: Nasopharyngeal(NP) swabs in vial transport medium   Nasopharynge  Result Value Ref Range   SARS-CoV-2, NAA Detected (A) Not Detected  SARS-COV-2, NAA 2 DAY TAT   Nasopharynge  Result Value Ref Range   SARS-CoV-2, NAA 2 DAY TAT Performed       Assessment & Plan:   Problem List Items Addressed This Visit   None      Follow up plan: No follow-ups on file.

## 2020-05-17 ENCOUNTER — Ambulatory Visit: Payer: 59 | Admitting: Nurse Practitioner

## 2020-05-20 ENCOUNTER — Encounter: Payer: Self-pay | Admitting: Nurse Practitioner

## 2020-05-20 ENCOUNTER — Other Ambulatory Visit: Payer: Self-pay

## 2020-05-20 ENCOUNTER — Telehealth: Payer: Self-pay | Admitting: *Deleted

## 2020-05-20 ENCOUNTER — Ambulatory Visit (INDEPENDENT_AMBULATORY_CARE_PROVIDER_SITE_OTHER): Payer: 59 | Admitting: Nurse Practitioner

## 2020-05-20 VITALS — BP 147/87 | HR 82 | Temp 97.7°F | Wt 350.0 lb

## 2020-05-20 DIAGNOSIS — F322 Major depressive disorder, single episode, severe without psychotic features: Secondary | ICD-10-CM | POA: Diagnosis not present

## 2020-05-20 DIAGNOSIS — F419 Anxiety disorder, unspecified: Secondary | ICD-10-CM | POA: Diagnosis not present

## 2020-05-20 MED ORDER — BUPROPION HCL ER (XL) 150 MG PO TB24: 150.0000 mg | ORAL_TABLET | Freq: Every day | ORAL | 0 refills | Status: DC

## 2020-05-20 NOTE — Progress Notes (Signed)
BP (!) 147/87   Pulse 82   Temp 97.7 F (36.5 C)   Wt (!) 350 lb (158.8 kg)   SpO2 98%   BMI 47.47 kg/m    Subjective:    Patient ID: Claire Walker, female    DOB: Apr 16, 1995, 25 y.o.   MRN: [id number]  HPI: Claire Walker is a 25 y.o. female  Chief Complaint  Patient presents with  . Depression  . Anxiety   DEPRESSION/ANXIETY Patient states her symptoms are better.  She has had two recent panic attacks but they have slowed down significantly.  Patient states she is not currently seeing a therapist but is willing try.  Patient is not ready to return to work full time.  She has started the process for FMLA. Denies SI.  Flowsheet Row Office Visit from 05/20/2020 in [facility]  PHQ-9 Total Score 11     GAD 7 : Generalized Anxiety Score 05/20/2020 04/01/2020 06/27/2019 06/15/2018  Nervous, Anxious, on Edge 1 3 3  0  Control/stop worrying 1 3 3 1   Worry too much - different things 2 2 3 1   Trouble relaxing 2 3 3 1   Restless 2 0 0 1  Easily annoyed or irritable 2 3 3  0  Afraid - awful might happen 1 1 2  0  Total GAD 7 Score 11 15 17 4   Anxiety Difficulty Somewhat difficult Extremely difficult Extremely difficult Not difficult at all      Relevant past medical, surgical, family and social history reviewed and updated as indicated. Interim medical history since our last visit reviewed. Allergies and medications reviewed and updated.  Review of Systems  Psychiatric/Behavioral: Positive for dysphoric mood. The patient is nervous/anxious.     Per HPI unless specifically indicated above     Objective:    BP (!) 147/87   Pulse 82   Temp 97.7 F (36.5 C)   Wt (!) 350 lb (158.8 kg)   SpO2 98%   BMI 47.47 kg/m   Wt Readings from Last 3 Encounters:  05/20/20 (!) 350 lb (158.8 kg)  01/14/20 (!) 347 lb (157.4 kg)  09/04/19 (!) 334 lb (151.5 kg)    Physical Exam Vitals and nursing note reviewed.  Constitutional:      General: She is not in acute distress.     Appearance: Normal appearance. She is obese. She is not ill-appearing, toxic-appearing or diaphoretic.  HENT:     Head: Normocephalic.     Right Ear: External ear normal.     Left Ear: External ear normal.     Nose: Nose normal.     Mouth/Throat:     Mouth: Mucous membranes are moist.     Pharynx: Oropharynx is clear.  Eyes:     General:        Right eye: No discharge.        Left eye: No discharge.     Extraocular Movements: Extraocular movements intact.     Conjunctiva/sclera: Conjunctivae normal.     Pupils: Pupils are equal, round, and reactive to light.  Cardiovascular:     Rate and Rhythm: Normal rate and regular rhythm.     Heart sounds: No murmur heard.   Pulmonary:     Effort: Pulmonary effort is normal. No respiratory distress.     Breath sounds: Normal breath sounds. No wheezing or rales.  Musculoskeletal:     Cervical back: Normal range of motion and neck supple.  Skin:    General: Skin is warm and  dry.     Capillary Refill: Capillary refill takes less than 2 seconds.  Neurological:     General: No focal deficit present.     Mental Status: She is alert and oriented to person, place, and time. Mental status is at baseline.  Psychiatric:        Mood and Affect: Mood normal.        Behavior: Behavior normal.        Thought Content: Thought content normal.        Judgment: Judgment normal.     Results for orders placed or performed in visit on 01/08/20  Novel Coronavirus, NAA ([facility])   Specimen: Nasopharyngeal(NP) swabs in vial transport medium   Nasopharynge  Result Value Ref Range   SARS-CoV-2, NAA Detected (A) Not Detected  SARS-COV-2, NAA 2 DAY TAT   Nasopharynge  Result Value Ref Range   SARS-CoV-2, NAA 2 DAY TAT Performed       Assessment & Plan:   Problem List Items Addressed This Visit      Other   Depression, major, single episode, severe (HCC) - Primary    Improving. However, not controlled.  Continue with Zoloft and begin Wellbutrin  daily. Side effects and benefits discussed today.  Referral placed for therapy.  Discussed with patient that she will need to get the FMLA paperwork to me soon for me to fill it out.  Letter written for patient to be out of work for two more weeks to continue to improve her mental health. Will follow up in 2 weeks for reevaluation.       Relevant Medications   buPROPion (WELLBUTRIN XL) 150 MG 24 hr tablet   Other Relevant Orders   Ambulatory referral to Psychology   Anxiety    Improving. However, not controlled.  Continue with Zoloft and begin Wellbutrin daily. Side effects and benefits discussed today.  Referral placed for therapy.  Discussed with patient that she will need to get the FMLA paperwork to me soon for me to fill it out.  Letter written for patient to be out of work for two more weeks to continue to improve her mental health. Will follow up in 2 weeks for reevaluation.       Relevant Medications   buPROPion (WELLBUTRIN XL) 150 MG 24 hr tablet   Other Relevant Orders   Ambulatory referral to Psychology       Follow up plan: Return in about 2 weeks (around 06/03/2020) for Depression/Anxiety FU (virtual).

## 2020-05-20 NOTE — Telephone Encounter (Signed)
Pt states she will make a payment next week.

## 2020-05-21 ENCOUNTER — Encounter: Payer: Self-pay | Admitting: Nurse Practitioner

## 2020-05-21 DIAGNOSIS — F419 Anxiety disorder, unspecified: Secondary | ICD-10-CM | POA: Insufficient documentation

## 2020-05-21 NOTE — Assessment & Plan Note (Signed)
Improving. However, not controlled.  Continue with Zoloft and begin Wellbutrin daily. Side effects and benefits discussed today.  Referral placed for therapy.  Discussed with patient that she will need to get the FMLA paperwork to me soon for me to fill it out.  Letter written for patient to be out of work for two more weeks to continue to improve her mental health. Will follow up in 2 weeks for reevaluation.

## 2020-05-21 NOTE — Assessment & Plan Note (Signed)
Improving. However, not controlled.  Continue with Zoloft and begin Wellbutrin daily. Side effects and benefits discussed today.  Referral placed for therapy.  Discussed with patient that she will need to get the FMLA paperwork to me soon for me to fill it out.  Letter written for patient to be out of work for two more weeks to continue to improve her mental health. Will follow up in 2 weeks for reevaluation.  

## 2020-06-03 ENCOUNTER — Telehealth: Payer: 59 | Admitting: Nurse Practitioner

## 2020-06-05 ENCOUNTER — Telehealth: Payer: 59 | Admitting: Nurse Practitioner

## 2020-06-08 ENCOUNTER — Other Ambulatory Visit: Payer: Self-pay | Admitting: Nurse Practitioner

## 2020-06-08 DIAGNOSIS — F419 Anxiety disorder, unspecified: Secondary | ICD-10-CM

## 2020-06-08 DIAGNOSIS — F322 Major depressive disorder, single episode, severe without psychotic features: Secondary | ICD-10-CM

## 2020-06-08 NOTE — Telephone Encounter (Signed)
Refill request forwarded to office. Was prescribed requested med 05/20/20 #30 and has not followed up as requested by provider by 06/03/20.  MyChart message sent to pt to call and make appt for follow up.

## 2020-06-10 NOTE — Telephone Encounter (Signed)
Called pt to schedule no answer vm not set up 

## 2020-06-11 NOTE — Telephone Encounter (Signed)
Unable to lvm to make apt °

## 2020-06-12 NOTE — Telephone Encounter (Signed)
Unable to lvm to make apt °

## 2020-06-14 ENCOUNTER — Encounter: Payer: Self-pay | Admitting: Nurse Practitioner

## 2020-06-14 NOTE — Telephone Encounter (Signed)
FYI Vm not set up unable to lvm to make apt.Sent letter

## 2020-06-21 ENCOUNTER — Ambulatory Visit: Payer: Self-pay | Admitting: *Deleted

## 2020-06-21 NOTE — Telephone Encounter (Signed)
  Patient's mother calling to schedule appt today patient. Patient took home pregnancy test and was positive and now having bleeding. Patient 's mother is not with patient and unable to answer all of NT questions. Instructed patient's mother she can call back when she is with patient or go to UC or ED for worseinng symptoms. Care advise given. Patient's mother verbalized understanding of care advise  And to call back or go to [facility] or ED.   Reason for Disposition . MILD vaginal bleeding (i.e., less than 1 pad / hour; less than patient's usual menstrual bleeding; not just spotting)  Answer Assessment - Initial Assessment Questions 1. ONSET: "When did this bleeding start?"       today 2. DESCRIPTION: "Describe the bleeding that you are having." "How much bleeding is there?"    - SPOTTING: spotting, or pinkish / brownish mucous discharge; does not fill panty liner or pad    - MILD:  less than 1 pad / hour; less than patient's usual menstrual bleeding   - MODERATE: 1-2 pads / hour; 1 menstrual cup every 6 hours; small-medium blood clots (e.g., pea, grape, small coin)   - SEVERE: soaking 2 or more pads/hour for 2 or more hours; 1 menstrual cup every 2 hours; bleeding not contained by pads or continuous red blood from vagina; large blood clots (e.g., golf ball, large coin)      Caller not with patient. Spotting and now bleeding  3. ABDOMINAL PAIN SEVERITY: If present, ask: "How bad is it?"  (e.g., Scale 1-10; mild, moderate, or severe)   - MILD (1-3): doesn't interfere with normal activities, abdomen soft and not tender to touch    - MODERATE (4-7): interferes with normal activities or awakens from sleep, abdomen tender to touch    - SEVERE (8-10): excruciating pain, doubled over, unable to do any normal activities     na 4. PREGNANCY: "Do you know how many weeks or months pregnant you are?" "When was the first day of your last normal menstrual period?"     No , took home pregnancy test  5. HEMODYNAMIC  STATUS: "Are you weak or feeling lightheaded?" If Yes, ask: "Can you stand and walk normally?"      na 6. OTHER SYMPTOMS: "What other symptoms are you having with the bleeding?" (e.g., passed tissue, vaginal discharge, fever, menstrual-type cramps)     na  Protocols used: PREGNANCY - VAGINAL BLEEDING LESS THAN [redacted] WEEKS EGA-A-AH

## 2020-06-21 NOTE — Telephone Encounter (Signed)
FYI

## 2020-07-01 ENCOUNTER — Other Ambulatory Visit: Payer: Self-pay | Admitting: Family Medicine

## 2020-07-01 NOTE — Telephone Encounter (Signed)
VM not available unable to lvm to make this apt

## 2020-07-01 NOTE — Telephone Encounter (Signed)
Requested medication (s) are due for refill today: Yes  Requested medication (s) are on the active medication list: Yes  Last refill:  06/21/19  Future visit scheduled: No  Notes to clinic:  Prescription expired.    Requested Prescriptions  Pending Prescriptions Disp Refills   JUNEL FE 1/20 1-20 MG-MCG tablet [Pharmacy Med Name: JUNEL FE 1 MG-20 MCG TABLET] 84 tablet 3    Sig: TAKE 1 TABLET BY MOUTH EVERY DAY      OB/GYN:  Contraceptives Failed - 07/01/2020  9:27 AM      Failed - Last BP in normal range    BP Readings from Last 1 Encounters:  05/20/20 (!) 147/87          Passed - Valid encounter within last 12 months    Recent Outpatient Visits           1 month ago Depression, major, single episode, severe (HCC)   [facility] [place], Clydie Braun, NP   3 months ago Depression, major, single episode, severe (HCC)   [facility] Larae Grooms, NP   5 months ago Upper respiratory tract infection, unspecified type   [facility] [place] Dense M, DO   10 months ago Depression, major, single episode, severe [facility])   [facility] Particia Nearing, [place]   10 months ago Depression, major, single episode, severe [facility])   [facility] Valentino Nose, NP

## 2020-07-02 NOTE — Telephone Encounter (Signed)
VM not available unable to lvm to make this apt  

## 2020-07-05 NOTE — Telephone Encounter (Signed)
Called pt no answer no vm sending mychart letter

## 2020-07-08 ENCOUNTER — Encounter: Payer: Self-pay | Admitting: Nurse Practitioner

## 2020-07-08 NOTE — Telephone Encounter (Signed)
FYI pt did not answer was sent a letter.

## 2020-07-29 IMAGING — US US OB LIMITED
1 series · 14 of 28 positions shown · non-contrast
Comparison: none

CLINICAL DATA: Pregnant patient, unknown gestational age.
Hypertension.

EXAM:
LIMITED OBSTETRIC ULTRASOUND

[Series 1: us ob limited · 37 acquisitions, 14 frames shown]
[im 2/37]
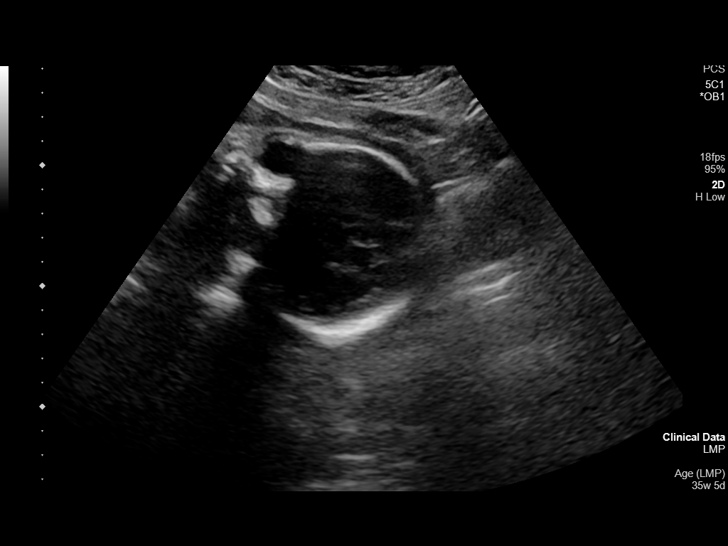
[im 5/37]
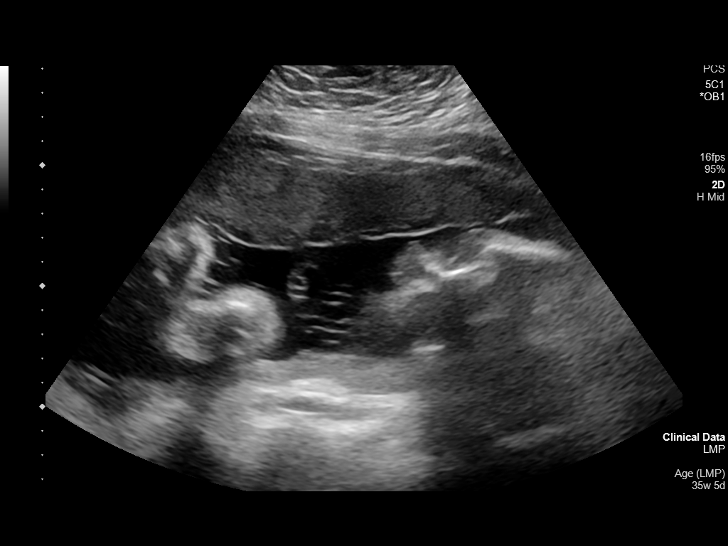
[im 7/37]
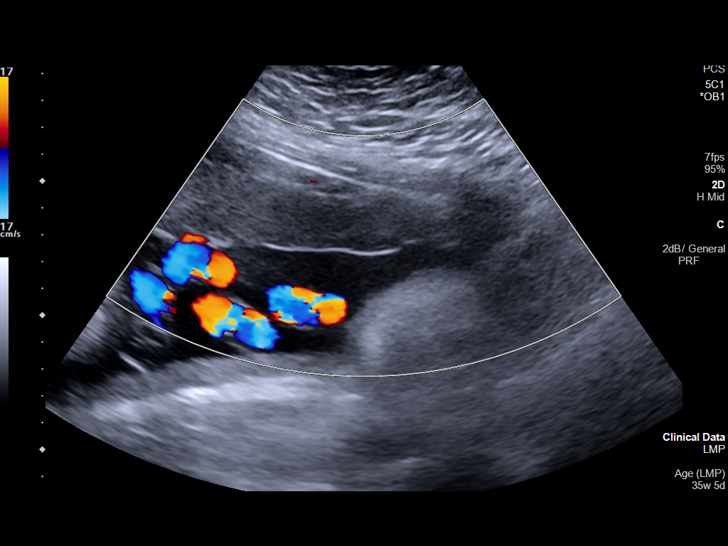
[im 10/37]
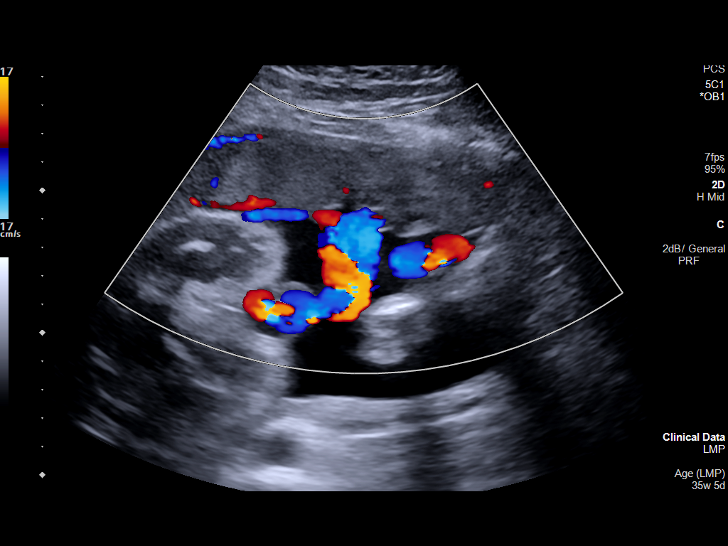
[im 13/37]
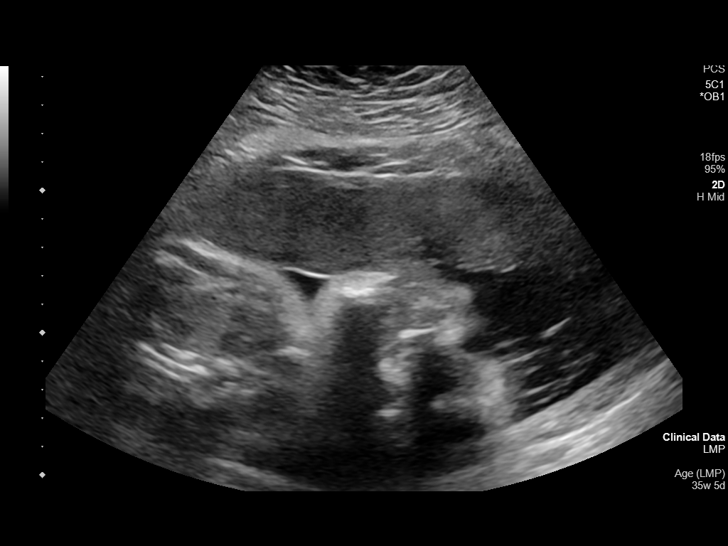
[im 15/37]
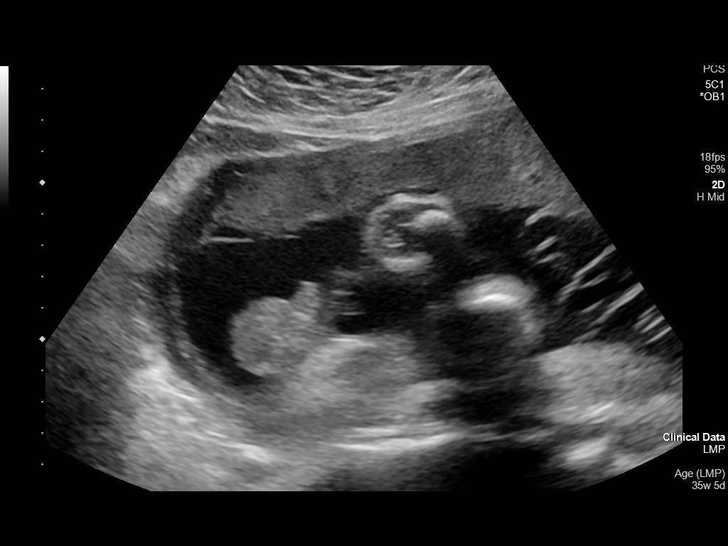
[im 18/37]
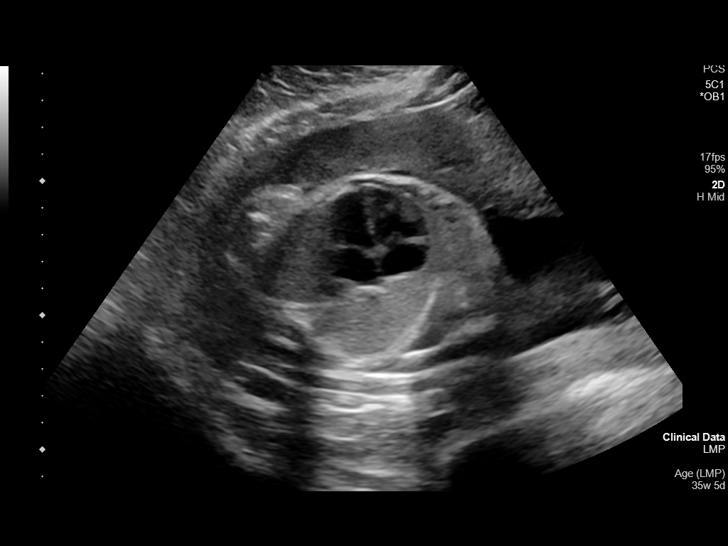
[im 21/37]
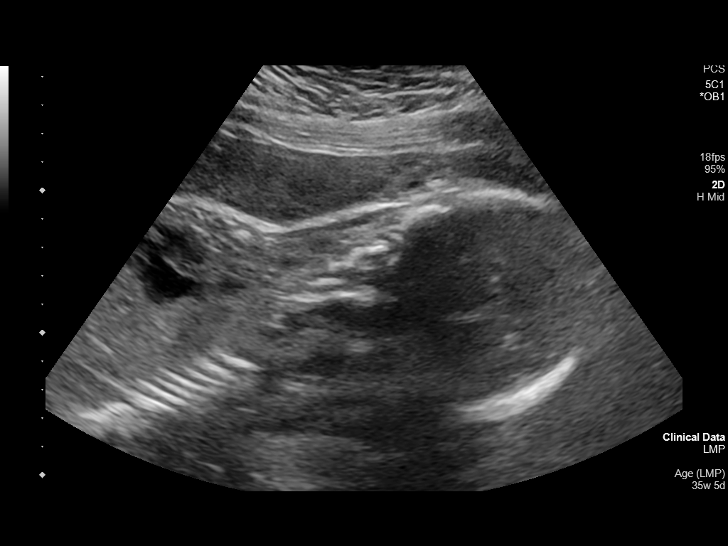
[im 23/37]
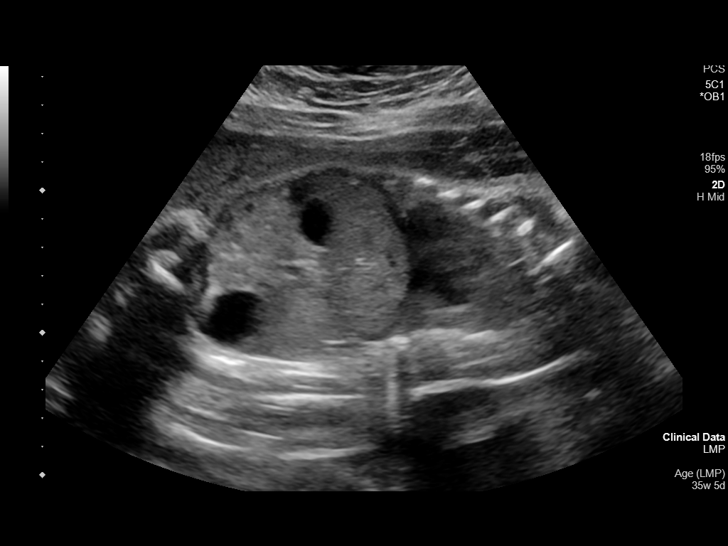
[im 26/37]
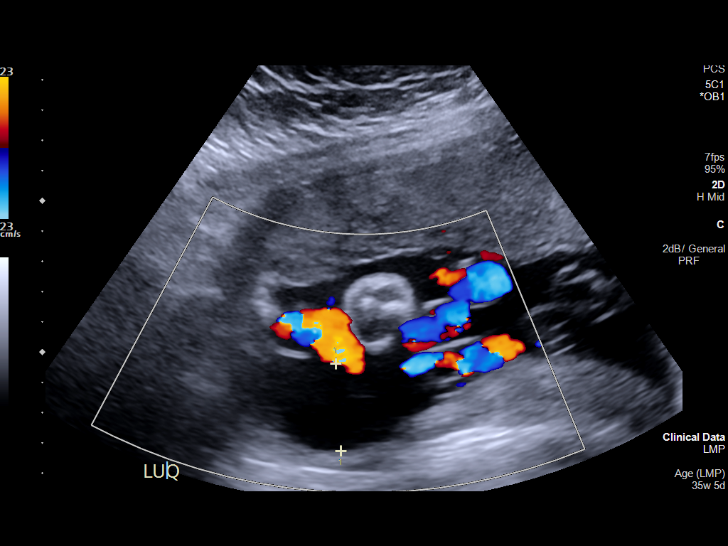
[im 29/37]
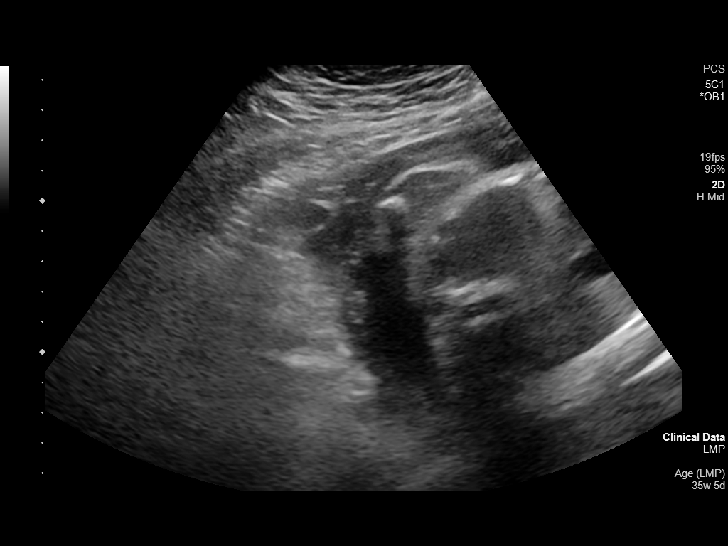
[im 31/37]
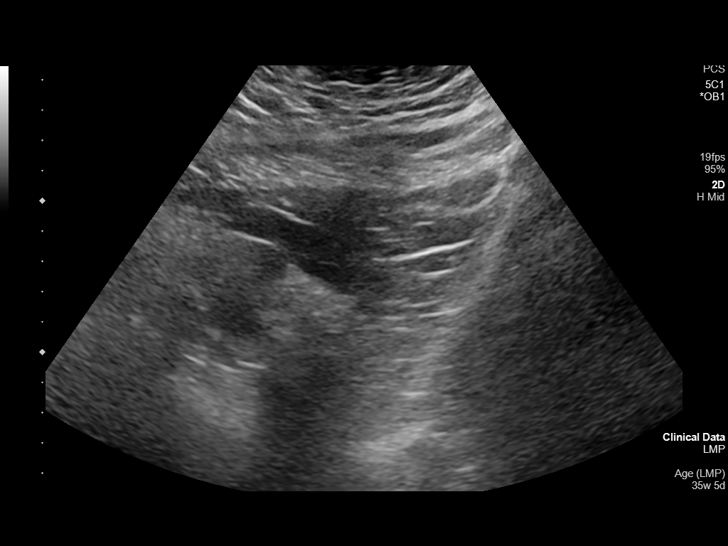
[im 34/37]
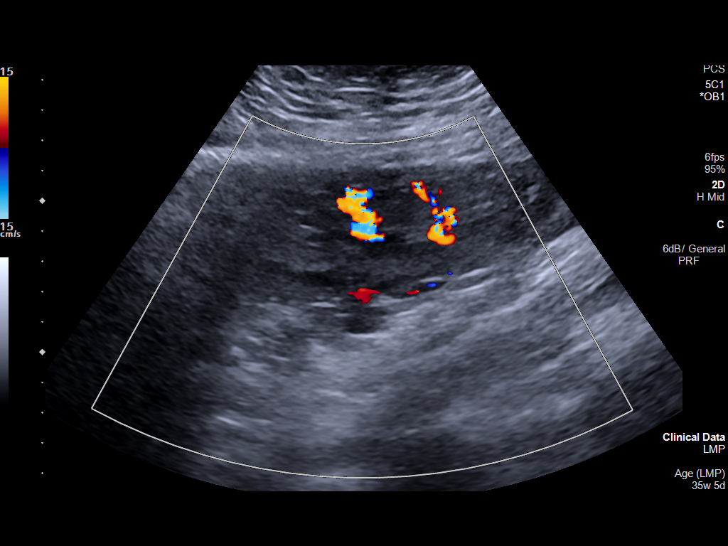
[im 37/37]
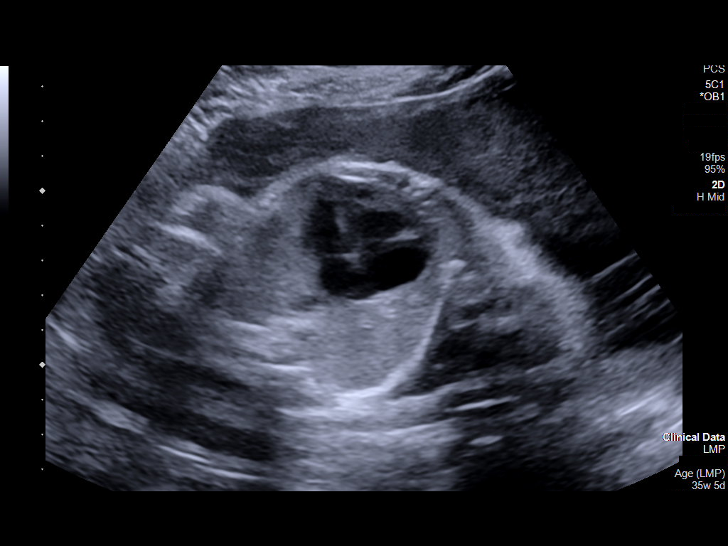

[14 of 28 positions shown; findings below may reference images not displayed]

FINDINGS: Number of Fetuses: 1

Heart Rate:  140 bpm

Movement: Yes

Presentation: Cephalic

Placental Location: Anterior

Previa: No

Amniotic Fluid (Subjective):  Within normal limits.

AFI: 12.4 cm

BPD: 8.1 cm 32 w  4 d

MATERNAL FINDINGS:

Cervix:  Appears closed.  4.6 cm in length.

Uterus/Adnexae: No abnormality visualized.
IMPRESSION: 1. Single live intrauterine pregnancy with a measured gestational
age of 32 weeks and 4 days.
2. No pregnancy complication.

This exam is performed on an emergent basis and does not
comprehensively evaluate fetal size, dating, or anatomy; follow-up
complete OB US should be considered if further fetal assessment is
warranted.

## 2020-08-08 ENCOUNTER — Other Ambulatory Visit: Payer: Self-pay | Admitting: Family Medicine

## 2020-12-09 ENCOUNTER — Other Ambulatory Visit: Payer: Self-pay | Admitting: Nurse Practitioner

## 2020-12-09 NOTE — Telephone Encounter (Signed)
Patient will need an office visit for further refills. Requested Prescriptions  Pending Prescriptions Disp Refills  . sertraline (ZOLOFT) 100 MG tablet [Pharmacy Med Name: SERTRALINE 100MG  TABLETS] 60 tablet 0    Sig: TAKE 2 TABLETS(200 MG) BY MOUTH DAILY     Psychiatry:  Antidepressants - SSRI Failed - 12/09/2020 10:06 AM      Failed - Valid encounter within last 6 months    Recent Outpatient Visits          6 months ago Depression, major, single episode, severe (HCC)   [facility] 12/11/2020, NP   8 months ago Depression, major, single episode, severe (HCC)   [facility], NP   11 months ago Upper respiratory tract infection, unspecified type   [facility], DO   1 year ago Depression, major, single episode, severe [facility])   [facility], PA-C   1 year ago Depression, major, single episode, severe (HCC)   [facility] Particia Nearing, NP             Passed - Completed PHQ-2 or PHQ-9 in the last 360 days

## 2021-01-16 ENCOUNTER — Ambulatory Visit (INDEPENDENT_AMBULATORY_CARE_PROVIDER_SITE_OTHER): Payer: Self-pay | Admitting: Nurse Practitioner

## 2021-01-16 ENCOUNTER — Encounter: Payer: Self-pay | Admitting: Nurse Practitioner

## 2021-01-16 ENCOUNTER — Other Ambulatory Visit: Payer: Self-pay

## 2021-01-16 VITALS — BP 124/89 | HR 85 | Temp 98.5°F | Ht 72.0 in | Wt 372.6 lb

## 2021-01-16 DIAGNOSIS — F322 Major depressive disorder, single episode, severe without psychotic features: Secondary | ICD-10-CM

## 2021-01-16 DIAGNOSIS — Z6841 Body Mass Index (BMI) 40.0 and over, adult: Secondary | ICD-10-CM

## 2021-01-16 DIAGNOSIS — Z789 Other specified health status: Secondary | ICD-10-CM | POA: Insufficient documentation

## 2021-01-16 DIAGNOSIS — H6502 Acute serous otitis media, left ear: Secondary | ICD-10-CM

## 2021-01-16 DIAGNOSIS — H6690 Otitis media, unspecified, unspecified ear: Secondary | ICD-10-CM | POA: Insufficient documentation

## 2021-01-16 LAB — PREGNANCY, URINE: Preg Test, Ur: NEGATIVE

## 2021-01-16 MED ORDER — NORETHIN ACE-ETH ESTRAD-FE 1-20 MG-MCG PO TABS: 1.0000 | ORAL_TABLET | Freq: Every day | ORAL | 3 refills | Status: DC

## 2021-01-16 MED ORDER — AMOXICILLIN-POT CLAVULANATE 875-125 MG PO TABS: 1.0000 | ORAL_TABLET | Freq: Two times a day (BID) | ORAL | 0 refills | Status: AC

## 2021-01-16 MED ORDER — SERTRALINE HCL 100 MG PO TABS: ORAL_TABLET | ORAL | 4 refills | Status: DC

## 2021-01-16 NOTE — Assessment & Plan Note (Signed)
Urine pregnancy testing today, CMP up to date.  Refills sent in.

## 2021-01-16 NOTE — Progress Notes (Signed)
BP 124/89    Pulse 85    Temp 98.5 F (36.9 C) (Oral)    Ht 6' (1.829 m)    Wt (!) 372 lb 9.6 oz (169 kg)    SpO2 97%    Breastfeeding No    BMI 50.53 kg/m    Subjective:    Patient ID: Claire Walker, female    DOB: January 20, 1995, 25 y.o.   MRN: [id number]  HPI: Claire Walker is a 25 y.o. female  Chief Complaint  Patient presents with   Ear Problem    Patient states she feels she may have a ear infection in her left ear. Patient states the symptoms started about 2 days ago, but the discomfort has gradually gotten worse. Patient states she has a sore throat and it is unbearable. Patient states she has tried Tylenol, Ibuprofen and Theraflu. Patient states the things she has tried isn't helping. 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    Sore Throat   Medication Refill    Patient is requesting refills on her birth control and Zoloft prescription.    She is requesting refills on birth control today -- is up to date on CMP, done in June at [facility], urine pregnancy testing due.  UPPER RESPIRATORY TRACT INFECTION Started with ear pain to left side 2 days ago.  Has a sore throat as well.  Is not Covid vaccinated, no flu vaccine. Worst symptom: Fever: no -- did get chills last night Cough: occasional Shortness of breath: none Wheezing: no Chest pain: no Chest tightness: no Chest congestion: no Nasal congestion: no Runny nose: yes Post nasal drip: yes Sneezing: no Sore throat: yes Swollen glands: no Sinus pressure: no Headache: yes Face pain: no Toothache: no Ear pain: yes left Ear pressure: yes left Eyes red/itching:no Eye drainage/crusting: no  Vomiting: no Rash: no Fatigue: yes Sick contacts:  coaches basketball team Strep contacts: no  Context: stable Recurrent sinusitis: no Relief with OTC cold/cough medications: no  Treatments attempted: Theraflu, Tylenol, Ibuprofen     DEPRESSION Continues on Sertraline 100  MG daily -- feels good when taking medication. Mood status: stable Satisfied with current treatment?: yes Symptom severity: moderate  Duration of current treatment : chronic Side effects: no Medication compliance: good compliance Psychotherapy/counseling: yes in the past Previous psychiatric medications: Wellbutrin Depressed mood: no Anxious mood: no Anhedonia: no Significant weight loss or gain: no Insomnia: none Fatigue: no Feelings of worthlessness or guilt: no Impaired concentration/indecisiveness: no Suicidal ideations: no Hopelessness: no Crying spells: no Depression screen [facility] 2/9 01/16/2021 05/20/2020 04/01/2020 08/03/2019 06/27/2019  Decreased Interest 0 1 3 3 3   Down, Depressed, Hopeless 0 1 2 3 3   PHQ - 2 Score 0 2 5 6 6   Altered sleeping 1 2 3 3 3   Tired, decreased energy 1 2 3 3 3   Change in appetite 1 2 3 1 3   Feeling bad or failure about yourself  1 1 2 3 3   Trouble concentrating 1 1 3 3 3   Moving slowly or fidgety/restless 1 1 3 3 3   Suicidal thoughts 0 0 0 0 0  PHQ-9 Score 6 11 22 22 24   Difficult doing work/chores Not difficult at all Somewhat difficult Extremely dIfficult Extremely dIfficult Extremely dIfficult    GAD 7 : Generalized Anxiety Score 01/16/2021 05/20/2020 04/01/2020 06/27/2019  Nervous, Anxious, on Edge 1 1 3 3   Control/stop worrying 1 1 3 3   Worry too  much - different things 1 2 2 3   Trouble relaxing 1 2 3 3   Restless 1 2 0 0  Easily annoyed or irritable 1 2 3 3   Afraid - awful might happen 1 1 1 2   Total GAD 7 Score 7 11 15 17   Anxiety Difficulty Not difficult at all Somewhat difficult Extremely difficult Extremely difficult    Relevant past medical, surgical, family and social history reviewed and updated as indicated. Interim medical history since our last visit reviewed. Allergies and medications reviewed and updated.  Review of Systems  Constitutional:  Positive for fatigue. Negative for activity change, appetite change, diaphoresis and  fever.  HENT:  Positive for ear pain and sore throat. Negative for congestion, ear discharge, postnasal drip, rhinorrhea, sinus pressure and voice change.   Respiratory:  Positive for cough. Negative for chest tightness, shortness of breath and wheezing.   Cardiovascular:  Negative for chest pain, palpitations and leg swelling.  Gastrointestinal: Negative.   Neurological:  Positive for headaches. Negative for dizziness, syncope, weakness, light-headedness and numbness.  Psychiatric/Behavioral: Negative.     Per HPI unless specifically indicated above     Objective:    BP 124/89    Pulse 85    Temp 98.5 F (36.9 C) (Oral)    Ht 6' (1.829 m)    Wt (!) 372 lb 9.6 oz (169 kg)    SpO2 97%    Breastfeeding No    BMI 50.53 kg/m   Wt Readings from Last 3 Encounters:  01/16/21 (!) 372 lb 9.6 oz (169 kg)  05/20/20 (!) 350 lb (158.8 kg)  01/14/20 (!) 347 lb (157.4 kg)    Physical Exam Vitals and nursing note reviewed.  Constitutional:      General: She is awake. She is not in acute distress.    Appearance: She is well-developed and well-groomed. She is obese. She is not ill-appearing or toxic-appearing.  HENT:     Head: Normocephalic.     Right Ear: Hearing, tympanic membrane, ear canal and external ear normal. No middle ear effusion.     Left Ear: Hearing and external ear normal. No decreased hearing noted. Swelling and tenderness present. Tympanic membrane is injected and bulging. Tympanic membrane is not perforated.     Ears:     Comments: TM intact to left side, no drainage.    Nose: Nose normal.     Right Sinus: No maxillary sinus tenderness or frontal sinus tenderness.     Left Sinus: No maxillary sinus tenderness or frontal sinus tenderness.     Mouth/Throat:     Mouth: Mucous membranes are moist.     Pharynx: Posterior oropharyngeal erythema (mild with cobblestoning) present. No pharyngeal swelling or oropharyngeal exudate.     Tonsils: 0 on the right. 0 on the left.  Eyes:      General: Lids are normal.        Right eye: No discharge.        Left eye: No discharge.     Conjunctiva/sclera: Conjunctivae normal.     Pupils: Pupils are equal, round, and reactive to light.  Neck:     Vascular: No carotid bruit.  Cardiovascular:     Rate and Rhythm: Normal rate and regular rhythm.     Heart sounds: Normal heart sounds. No murmur heard.   No gallop.  Pulmonary:     Effort: Pulmonary effort is normal. No accessory muscle usage or respiratory distress.     Breath sounds: Normal breath  sounds.  Abdominal:     General: Bowel sounds are normal.     Palpations: Abdomen is soft. There is no hepatomegaly or splenomegaly.  Musculoskeletal:     Cervical back: Normal range of motion and neck supple.     Right lower leg: No edema.     Left lower leg: No edema.  Lymphadenopathy:     Head:     Right side of head: No submental, submandibular, tonsillar, preauricular or posterior auricular adenopathy.     Left side of head: No submental, submandibular, tonsillar, preauricular or posterior auricular adenopathy.     Cervical: No cervical adenopathy.  Skin:    General: Skin is warm and dry.  Neurological:     Mental Status: She is alert and oriented to person, place, and time.  Psychiatric:        Attention and Perception: Attention normal.        Mood and Affect: Mood normal.        Speech: Speech normal.        Behavior: Behavior normal. Behavior is cooperative.        Thought Content: Thought content normal.    Results for orders placed or performed in visit on 01/08/20  Novel Coronavirus, NAA ([facility])   Specimen: Nasopharyngeal(NP) swabs in vial transport medium   Nasopharynge  Result Value Ref Range   SARS-CoV-2, NAA Detected (A) Not Detected  SARS-COV-2, NAA 2 DAY TAT   Nasopharynge  Result Value Ref Range   SARS-CoV-2, NAA 2 DAY TAT Performed       Assessment & Plan:   Problem List Items Addressed This Visit       Nervous and Auditory   Otitis media     Acute, TM intact.  At this time will start Augmentin daily for 10 days and have return in 1 week for recheck, sooner if worsening symptoms.  Recommend Tylenol or Ibuprofen as needed for pain.  May apply gentle heat as well.      Relevant Medications   amoxicillin-clavulanate (AUGMENTIN) 875-125 MG tablet     Other   Depression, major, single episode, severe (HCC) - Primary    Chronic, stable.  Denies SI/HI.  Well-controlled on Sertraline, continue this regimen and adjust as needed based on symptoms.  PHQ9 = 6 and GAD7 = 7 today.  Refills sent.      Relevant Medications   sertraline (ZOLOFT) 100 MG tablet   Obesity    BMI 50.53.  Recommended eating smaller high protein, low fat meals more frequently and exercising 30 mins a day 5 times a week with a goal of 10-15lb weight loss in the next 3 months. Patient voiced their understanding and motivation to adhere to these recommendations.       Uses birth control    Urine pregnancy testing today, CMP up to date.  Refills sent in.      Relevant Orders   Pregnancy, urine     Follow up plan: Return in about 1 week (around 01/23/2021) for Ear Recheck (LEFT).

## 2021-01-16 NOTE — Patient Instructions (Signed)
Otitis Media, Adult Otitis media is a condition in which the middle ear is red and swollen (inflamed) and full of fluid. The middle ear is the part of the ear that contains bones for hearing as well as air that helps send sounds to the brain. The condition usually goes away on its own. What are the causes? This condition is caused by a blockage in the eustachian tube. This tube connects the middle ear to the back of the nose. It normally allows air into the middle ear. The blockage is caused by fluid or swelling. Problems that can cause blockage include: A cold or infection that affects the nose, mouth, or throat. Allergies. An irritant, such as tobacco smoke. Adenoids that have become large. The adenoids are soft tissue located in the back of the throat, behind the nose and the roof of the mouth. Growth or swelling in the upper part of the throat, just behind the nose (nasopharynx). Damage to the ear caused by a change in pressure. This is called barotrauma. What increases the risk? You are more likely to develop this condition if you: Smoke or are exposed to tobacco smoke. Have an opening in the roof of your mouth (cleft palate). Have acid reflux. Have problems in your body's defense system (immune system). What are the signs or symptoms? Symptoms of this condition include: Ear pain. Fever. Problems with hearing. Being tired. Fluid leaking from the ear. Ringing in the ear. How is this treated? This condition can go away on its own within 3-5 days. But if the condition is caused by germs (bacteria) and does not go away on its own, or if it keeps coming back, your doctor may: Give you antibiotic medicines. Give you medicines for pain. Follow these instructions at home: Take over-the-counter and prescription medicines only as told by your doctor. If you were prescribed an antibiotic medicine, take it as told by your doctor. Do not stop taking it even if you start to feel better. Keep  all follow-up visits. Contact a doctor if: You have bleeding from your nose. There is a lump on your neck. You are not feeling better in 5 days. You feel worse instead of better. Get help right away if: You have pain that is not helped with medicine. You have swelling, redness, or pain around your ear. You get a stiff neck. You cannot move part of your face (paralysis). You notice that the bone behind your ear hurts when you touch it. You get a very bad headache. Summary Otitis media means that the middle ear is red, swollen, and full of fluid. This condition usually goes away on its own. If the problem does not go away, treatment may be needed. You may be given medicines to treat the infection or to treat your pain. If you were prescribed an antibiotic medicine, take it as told by your doctor. Do not stop taking it even if you start to feel better. Keep all follow-up visits. This information is not intended to replace advice given to you by your health care provider. Make sure you discuss any questions you have with your health care provider. Document Revised: 04/15/2020 Document Reviewed: 04/15/2020 Elsevier Patient Education  2022 [employer]  

## 2021-01-16 NOTE — Assessment & Plan Note (Addendum)
Chronic, stable.  Denies SI/HI.  Well-controlled on Sertraline, continue this regimen and adjust as needed based on symptoms.  PHQ9 = 6 and GAD7 = 7 today.  Refills sent.

## 2021-01-16 NOTE — Assessment & Plan Note (Signed)
BMI 50.53.  Recommended eating smaller high protein, low fat meals more frequently and exercising 30 mins a day 5 times a week with a goal of 10-15lb weight loss in the next 3 months. Patient voiced their understanding and motivation to adhere to these recommendations.

## 2021-01-16 NOTE — Assessment & Plan Note (Signed)
Acute, TM intact.  At this time will start Augmentin daily for 10 days and have return in 1 week for recheck, sooner if worsening symptoms.  Recommend Tylenol or Ibuprofen as needed for pain.  May apply gentle heat as well.

## 2021-01-24 ENCOUNTER — Ambulatory Visit: Payer: Self-pay | Admitting: Nurse Practitioner

## 2021-01-24 NOTE — Progress Notes (Deleted)
° °  There were no vitals taken for this visit.   Subjective:    Patient ID: Claire Walker, female    DOB: 30-Oct-1995, 26 y.o.   MRN: [id number]  HPI: Claire Walker is a 26 y.o. female  No chief complaint on file.   Relevant past medical, surgical, family and social history reviewed and updated as indicated. Interim medical history since our last visit reviewed. Allergies and medications reviewed and updated.  Review of Systems  Per HPI unless specifically indicated above     Objective:    There were no vitals taken for this visit.  Wt Readings from Last 3 Encounters:  01/16/21 (!) 372 lb 9.6 oz (169 kg)  05/20/20 (!) 350 lb (158.8 kg)  01/14/20 (!) 347 lb (157.4 kg)    Physical Exam  Results for orders placed or performed in visit on 01/16/21  Pregnancy, urine  Result Value Ref Range   Preg Test, Ur Negative Negative      Assessment & Plan:   Problem List Items Addressed This Visit   None    Follow up plan: No follow-ups on file.

## 2021-04-15 ENCOUNTER — Other Ambulatory Visit: Payer: Self-pay

## 2021-04-15 ENCOUNTER — Encounter: Payer: Self-pay | Admitting: Internal Medicine

## 2021-04-15 ENCOUNTER — Ambulatory Visit: Payer: Self-pay

## 2021-04-15 ENCOUNTER — Ambulatory Visit (INDEPENDENT_AMBULATORY_CARE_PROVIDER_SITE_OTHER): Payer: Self-pay | Admitting: Internal Medicine

## 2021-04-15 VITALS — BP 124/84 | HR 92 | Temp 98.8°F | Ht 72.01 in | Wt 374.2 lb

## 2021-04-15 DIAGNOSIS — R509 Fever, unspecified: Secondary | ICD-10-CM | POA: Insufficient documentation

## 2021-04-15 DIAGNOSIS — R5081 Fever presenting with conditions classified elsewhere: Secondary | ICD-10-CM

## 2021-04-15 DIAGNOSIS — J069 Acute upper respiratory infection, unspecified: Secondary | ICD-10-CM | POA: Insufficient documentation

## 2021-04-15 MED ORDER — METHYLPREDNISOLONE 4 MG PO TBPK: ORAL_TABLET | ORAL | 0 refills | Status: DC

## 2021-04-15 MED ORDER — FEXOFENADINE HCL 180 MG PO TABS: 180.0000 mg | ORAL_TABLET | Freq: Every day | ORAL | 1 refills | Status: DC

## 2021-04-15 MED ORDER — AMOXICILLIN-POT CLAVULANATE 875-125 MG PO TABS: 1.0000 | ORAL_TABLET | Freq: Two times a day (BID) | ORAL | 0 refills | Status: DC

## 2021-04-15 MED ORDER — ALBUTEROL SULFATE HFA 108 (90 BASE) MCG/ACT IN AERS
2.0000 | INHALATION_SPRAY | Freq: Four times a day (QID) | RESPIRATORY_TRACT | 0 refills | Status: DC | PRN
Start: 2021-04-15 — End: 2022-05-12

## 2021-04-15 MED ORDER — BENZONATATE 100 MG PO CAPS: 100.0000 mg | ORAL_CAPSULE | Freq: Two times a day (BID) | ORAL | 0 refills | Status: DC | PRN

## 2021-04-15 MED ORDER — ALBUTEROL SULFATE (2.5 MG/3ML) 0.083% IN NEBU
2.5000 mg | INHALATION_SOLUTION | Freq: Once | RESPIRATORY_TRACT | Status: AC
  Administered 2021-04-15: 2.5 mg via RESPIRATORY_TRACT

## 2021-04-15 MED ORDER — TRIAMCINOLONE ACETONIDE 40 MG/ML IJ SUSP
80.0000 mg | Freq: Once | INTRAMUSCULAR | Status: AC
  Administered 2021-04-15: 80 mg via INTRAMUSCULAR

## 2021-04-15 NOTE — Progress Notes (Signed)
? ?BP 124/84   Pulse 92   Temp 98.8 ?F (37.1 ?C) (Oral)   Ht 6' 0.01" (1.829 m)   Wt (!) 374 lb 3.2 oz (169.7 kg)   SpO2 97%   BMI 50.74 kg/m?   ? ?Subjective:  ? ? Patient ID: Claire Walker, female    DOB: 1995/11/26, 26 y.o.   MRN: [id number] ? ?Chief Complaint  ?Patient presents with  ? Cough  ?  Dizziness, sneezing, sob, cant take a deep breath, started 3 days ago, otc allegra  ? ? ?HPI: ?Claire Walker is a 26 y.o. female ? ? ? ?Pt is here for a OV  ? ? ?Cough ?This is a new (x 3 days) problem. The current episode started in the past 7 days. The problem has been gradually worsening. The cough is Productive of sputum. Associated symptoms include nasal congestion, shortness of breath and wheezing. Pertinent negatives include no chest pain, chills, ear congestion, ear pain, fever, headaches, heartburn, hemoptysis, myalgias, postnasal drip, rash, rhinorrhea, sore throat, sweats or weight loss.  ?Wheezing  ?This is a new problem. Associated symptoms include coughing, shortness of breath and sputum production. Pertinent negatives include no abdominal pain, chest pain, chills, coryza, diarrhea, ear pain, fever, headaches, hemoptysis, neck pain, rash, rhinorrhea, sore throat, swollen glands or vomiting.  ? ?Chief Complaint  ?Patient presents with  ? Cough  ?  Dizziness, sneezing, sob, cant take a deep breath, started 3 days ago, otc allegra  ? ? ?Relevant past medical, surgical, family and social history reviewed and updated as indicated. Interim medical history since our last visit reviewed. ?Allergies and medications reviewed and updated. ? ?Review of Systems  ?Constitutional:  Negative for chills, fever and weight loss.  ?HENT:  Negative for ear pain, postnasal drip, rhinorrhea and sore throat.   ?Respiratory:  Positive for cough, sputum production, shortness of breath and wheezing. Negative for hemoptysis.   ?Cardiovascular:  Negative for chest pain.  ?Gastrointestinal:  Negative for abdominal pain, diarrhea,  heartburn and vomiting.  ?Musculoskeletal:  Negative for myalgias and neck pain.  ?Skin:  Negative for rash.  ?Neurological:  Negative for headaches.  ? ?Per HPI unless specifically indicated above ? ?   ?Objective:  ?  ?BP 124/84   Pulse 92   Temp 98.8 ?F (37.1 ?C) (Oral)   Ht 6' 0.01" (1.829 m)   Wt (!) 374 lb 3.2 oz (169.7 kg)   SpO2 97%   BMI 50.74 kg/m?   ?Wt Readings from Last 3 Encounters:  ?04/15/21 (!) 374 lb 3.2 oz (169.7 kg)  ?01/16/21 (!) 372 lb 9.6 oz (169 kg)  ?05/20/20 (!) 350 lb (158.8 kg)  ?  ?Physical Exam ?Vitals and nursing note reviewed.  ?Constitutional:   ?   General: She is in acute distress.  ?   Appearance: Normal appearance. She is ill-appearing. She is not diaphoretic.  ?Cardiovascular:  ?   Rate and Rhythm: Normal rate and regular rhythm.  ?Pulmonary:  ?   Effort: No respiratory distress.  ?   Breath sounds: No stridor. Wheezing present. No rhonchi or rales.  ?Chest:  ?   Chest wall: No tenderness.  ?Neurological:  ?   Mental Status: She is alert.  ? ? ?Results for orders placed or performed in visit on 01/16/21  ?Pregnancy, urine  ?Result Value Ref Range  ? Preg Test, Ur Negative Negative  ? ?   ? ? ?Current Outpatient Medications:  ?  norethindrone-ethinyl estradiol-FE (BLISOVI FE 1/20) 1-20 MG-MCG  tablet, Take 1 tablet by mouth daily., Disp: 84 tablet, Rfl: 3 ?  sertraline (ZOLOFT) 100 MG tablet, TAKE 2 TABLETS(200 MG) BY MOUTH DAILY, Disp: 180 tablet, Rfl: 4  ? ? ?Assessment & Plan:  ?Uri/ wheezing ?Albuterol  given x1.  Kenalog IM administered today. ?Will start patient on albuterol as rescue inhaler. ?Increase fluid intake.  ?Headahce - tyelnol every 4-6 hrs prn and alternate this with ibubrufen 800 mg q 8 hrly. ?Sinus pressure: use steam inhalation.  ?OTC -  Allegra / claritin.  Quarantine until COVID swab is back ? ?Pt verbalized understanding of such ?To go to the ER if worsnes ? ?Problem List Items Addressed This Visit   ?None ?Visit Diagnoses   ? ? Upper respiratory  tract infection, unspecified type    -  Primary  ? Relevant Orders  ? Veritor Flu A/B Waived  ? Rapid Strep Screen ([facility] ONLY)  ? Novel Coronavirus, NAA ([facility])  ? ?  ?  ? ?Orders Placed This Encounter  ?Procedures  ? Rapid Strep Screen ([facility] ONLY)  ? Novel Coronavirus, NAA ([facility])  ? Veritor Flu A/B Waived  ?  ? ?No orders of the defined types were placed in this encounter. ?  ? ?Follow up plan: ?No follow-ups on file. ? ?9: 41 am:  ?Called back to check on pt. Hasnt checked home COVID / rapid antigen testing yet.  ?To call office / send a mychart message as soon as she does this.  ?Still has wheezing/ some chest pressure ,Used her inhaler last night hasnt taken it yet. Advised to take this q 4-6 hryl as needed. To take tyenolol and ibuburfen alternalty for her symptoms as well.  ?Feels about 1% better advised to go to the ER if she isnt feeling better in the next hour or so.  ?Will need to go to the ER if the sob/ chest pressure continue or if she develops chest pain , dizziness. ? Pt verbalized understanding of such. ? ?

## 2021-04-15 NOTE — Telephone Encounter (Signed)
PT seen today by Dr. Charlotta Newton ?

## 2021-04-15 NOTE — Telephone Encounter (Signed)
Reason for Disposition ?? [1] MILD difficulty breathing (e.g., minimal/no SOB at rest, SOB with walking, pulse <100) AND [2] NEW-onset or WORSE than normal ? ?Answer Assessment - Initial Assessment Questions ?1. RESPIRATORY STATUS: "Describe your breathing?" (e.g., wheezing, shortness of breath, unable to speak, severe coughing)  ?    SOB can't take deep breaths ?2. ONSET: "When did this breathing problem begin?"  ?    3 days ?3. PATTERN "Does the difficult breathing come and go, or has it been constant since it started?"  ?    constant ?4. SEVERITY: "How bad is your breathing?" (e.g., mild, moderate, severe)  ?  - MILD: No SOB at rest, mild SOB with walking, speaks normally in sentences, can lie down, no retractions, pulse < 100.  ?  - MODERATE: SOB at rest, SOB with minimal exertion and prefers to sit, cannot lie down flat, speaks in phrases, mild retractions, audible wheezing, pulse 100-120.  ?  - SEVERE: Very SOB at rest, speaks in single words, struggling to breathe, sitting hunched forward, retractions, pulse > 120  ?    mild ?5. RECURRENT SYMPTOM: "Have you had difficulty breathing before?" If Yes, ask: "When was the last time?" and "What happened that time?"  ?    no ?6. CARDIAC HISTORY: "Do you have any history of heart disease?" (e.g., heart attack, angina, bypass surgery, angioplasty)  ?    no ?7. LUNG HISTORY: "Do you have any history of lung disease?"  (e.g., pulmonary embolus, asthma, emphysema) ?    no ?8. CAUSE: "What do you think is causing the breathing problem?"  ?    Cold progressed  ?9. OTHER SYMPTOMS: "Do you have any other symptoms? (e.g., dizziness, runny nose, cough, chest pain, fever) ?    Runny nose, fever ?10. O2 SATURATION MONITOR:  "Do you use an oxygen saturation monitor (pulse oximeter) at home?" If Yes, "What is your reading (oxygen level) today?" "What is your usual oxygen saturation reading?" (e.g., 95%) ?      na ?11. PREGNANCY: "Is there any chance you are pregnant?" "When was  your last menstrual period?" ?      no ?12. TRAVEL: "Have you traveled out of the country in the last month?" (e.g., travel history, exposures) ?      no ? ?Protocols used: Breathing Difficulty-A-AH ? ?

## 2021-04-15 NOTE — Telephone Encounter (Signed)
?  Chief Complaint: Breathing problems ?Symptoms: Cough, Fever, Wheezing - can't take deep breaths ?Frequency: 1 week ?Pertinent Negatives: Patient denies SOB ?Disposition: [] ED /[] Urgent Care (no appt availability in office) / [x] Appointment(In office/virtual)/ []  [facility]/ [] Home Care/ [] Refused Recommended Disposition /[] [facility]/ []  Follow-up with PCP ?Additional Notes: This appears to have started as a cold, but now has settled into lungs. She is having difficulty taking deep breaths, is coughing has a fever. ?

## 2021-04-16 ENCOUNTER — Telehealth: Payer: Self-pay | Admitting: Nurse Practitioner

## 2021-04-16 ENCOUNTER — Encounter: Payer: Self-pay | Admitting: Internal Medicine

## 2021-04-16 NOTE — Telephone Encounter (Signed)
Copied from [place] [id number]. Topic: General - Other ?>> Apr 16, 2021  8:33 AM Camille Bal, Gerlene Burdock wrote: ?Reason for CRM:pt called in, needing Dr's note for work, uploaded to my chart. ?

## 2021-04-16 NOTE — Telephone Encounter (Signed)
Patient made aware of results and verbalized understanding.  

## 2021-04-17 LAB — NOVEL CORONAVIRUS, NAA: SARS-CoV-2, NAA: NOT DETECTED

## 2021-04-17 NOTE — Progress Notes (Signed)
Please let pt know this was normal. If she isnt better please have her follow up with pcp thnx.

## 2021-04-19 LAB — VERITOR FLU A/B WAIVED
Influenza A: NEGATIVE
Influenza B: NEGATIVE

## 2021-04-19 LAB — CULTURE, GROUP A STREP: Strep A Culture: NEGATIVE

## 2021-04-19 LAB — RAPID STREP SCREEN (MED CTR MEBANE ONLY): Strep Gp A Ag, IA W/Reflex: NEGATIVE

## 2022-04-23 ENCOUNTER — Ambulatory Visit: Payer: Self-pay | Admitting: *Deleted

## 2022-04-23 ENCOUNTER — Encounter: Payer: Self-pay | Admitting: Physician Assistant

## 2022-04-23 ENCOUNTER — Ambulatory Visit: Payer: 59 | Admitting: Physician Assistant

## 2022-04-23 VITALS — BP 126/82 | HR 81 | Temp 96.8°F | Wt 389.0 lb

## 2022-04-23 DIAGNOSIS — J3089 Other allergic rhinitis: Secondary | ICD-10-CM | POA: Diagnosis not present

## 2022-04-23 DIAGNOSIS — H6593 Unspecified nonsuppurative otitis media, bilateral: Secondary | ICD-10-CM | POA: Diagnosis not present

## 2022-04-23 NOTE — Telephone Encounter (Signed)
Summary: right ear discomfort   The patient shares that they've experienced right ear and discomfort for multiple days  The patient shares that they believe it's allergy related and shares that they are experiencing no other symptoms  The patient feels as if their ear is congested  Please contact the patient further when possible           Chief Complaint: right ear pain Symptoms: right ear pain /discomfort , feels congested draining . Feels like has been swimming but has not. Has tried nasal spray last week for allergy related sx.  Frequency: couple of days ago  Pertinent Negatives: Patient denies fever, no neck pain no cold sx no sore throat no dizziness Disposition: [] ED /[] Urgent Care (no appt availability in office) / [x] Appointment(In office/virtual)/ []  [facility]/ [] Home Care/ [] Refused Recommended Disposition /[] [facility]/ []  Follow-up with PCP Additional Notes:   No available appt with PCP today . Patient agreed to go to different practice where another provider at [facility] working today and scheduled appt for today at 3:40 pm with E. Mecum, PA. Please advise regarding insurance now has [employer] and patient will bring information to OV.        Reason for Disposition  [1] SEVERE pain AND [2] not improved 2 hours after taking analgesic medication (e.g., ibuprofen or acetaminophen)  Answer Assessment - Initial Assessment Questions 1. LOCATION: "Which ear is involved?"     Right ear  2. ONSET: "When did the ear start hurting"      Couple of days ago  3. SEVERITY: "How bad is the pain?"  (Scale 1-10; mild, moderate or severe)   - MILD (1-3): doesn't interfere with normal activities    - MODERATE (4-7): interferes with normal activities or awakens from sleep    - SEVERE (8-10): excruciating pain, unable to do any normal activities      Hurting worse 4. URI SYMPTOMS: "Do you have a runny nose or cough?"     No  5. FEVER: "Do you have a fever?" If  Yes, ask: "What is your temperature, how was it measured, and when did it start?"     na 6. CAUSE: "Have you been swimming recently?", "How often do you use Q-TIPS?", "Have you had any recent air travel or scuba diving?"     Has not been swimming but ear feels like she has been swimming  7. OTHER SYMPTOMS: "Do you have any other symptoms?" (e.g., headache, stiff neck, dizziness, vomiting, runny nose, decreased hearing)     Right ear pain , feels congested draining some fluid, feels like allergy related 8. PREGNANCY: "Is there any chance you are pregnant?" "When was your last menstrual period?"     na  Protocols used: [facility]ann Punches

## 2022-04-23 NOTE — Progress Notes (Signed)
Acute Office Visit   Patient: Claire Walker   DOB: Feb 15, 1995   27 y.o. Female  MRN: [id number] Visit Date: 04/23/2022  Today's healthcare provider: Dani Gobble Livianna Petraglia, PA-C  Introduced myself to the patient as a Journalist, newspaper and provided education on APPs in clinical practice.    Chief Complaint  Patient presents with   Ear Pain    Started about a week or two.     Subjective    HPI HPI     Ear Pain    Additional comments: Started about a week or two.        Last edited by Kizzie Furnish, CMA on 04/23/2022  3:34 PM.      She reports right ear pain  She states both ears feel like fluid is "sloshing around in there" especially when she goes to lay down at night She reports the sensation that her ears have felt "runny" when asked about discharge but she reports this is normal She thinks this may be related to allergies as her symptoms started when pollen started to get more prevalent She reports some sinus congestion about 2 weeks ago but this seems to have resolved  She is taking Flonase as needed    Medications: Outpatient Medications Prior to Visit  Medication Sig   albuterol (VENTOLIN HFA) 108 (90 Base) MCG/ACT inhaler Inhale 2 puffs into the lungs every 6 (six) hours as needed for wheezing or shortness of breath.   [DISCONTINUED] fexofenadine (ALLEGRA ALLERGY) 180 MG tablet Take 1 tablet (180 mg total) by mouth daily.   norethindrone-ethinyl estradiol-FE (BLISOVI FE 1/20) 1-20 MG-MCG tablet Take 1 tablet by mouth daily. (Patient not taking: Reported on 04/23/2022)   sertraline (ZOLOFT) 100 MG tablet TAKE 2 TABLETS(200 MG) BY MOUTH DAILY (Patient not taking: Reported on 04/23/2022)   [DISCONTINUED] amoxicillin-clavulanate (AUGMENTIN) 875-125 MG tablet Take 1 tablet by mouth 2 (two) times daily.   [DISCONTINUED] benzonatate (TESSALON) 100 MG capsule Take 1 capsule (100 mg total) by mouth 2 (two) times daily as needed for cough.   [DISCONTINUED] methylPREDNISolone (MEDROL DOSEPAK)  4 MG TBPK tablet Use as directed   No facility-administered medications prior to visit.    Review of Systems  Constitutional:  Negative for chills, fatigue and fever.  HENT:  Positive for ear discharge and ear pain. Negative for congestion, sinus pressure, sinus pain and sore throat.   Eyes:  Positive for discharge and itching.  Respiratory:  Negative for cough, shortness of breath and wheezing.        Objective    BP 126/82 (BP Location: Left Wrist, Patient Position: Sitting, Cuff Size: Normal)   Pulse 81   Temp (!) 96.8 F (36 C) (Temporal)   Wt (!) 389 lb (176.4 kg)   SpO2 99%   BMI 52.75 kg/m    Physical Exam Vitals reviewed.  Constitutional:      General: She is awake.     Appearance: Normal appearance. She is well-developed and well-groomed.  HENT:     Head: Normocephalic and atraumatic.     Right Ear: Hearing and ear canal normal. A middle ear effusion is present. Tympanic membrane is not injected, scarred, perforated, erythematous, retracted or bulging.     Left Ear: Hearing and ear canal normal. A middle ear effusion is present. Tympanic membrane is not injected, scarred, perforated, erythematous, retracted or bulging.     Nose: Nose normal.     Mouth/Throat:  Lips: Pink.     Mouth: Mucous membranes are moist.     Pharynx: Uvula midline.  Eyes:     Extraocular Movements: Extraocular movements intact.     Conjunctiva/sclera: Conjunctivae normal.     Pupils: Pupils are equal, round, and reactive to light.  Pulmonary:     Effort: Pulmonary effort is normal.  Musculoskeletal:     Cervical back: Normal range of motion.  Neurological:     General: No focal deficit present.     Mental Status: She is alert and oriented to person, place, and time. Mental status is at baseline.  Psychiatric:        Mood and Affect: Mood normal.        Behavior: Behavior normal. Behavior is cooperative.        Thought Content: Thought content normal.        Judgment: Judgment  normal.       No results found for any visits on 04/23/22.  Assessment & Plan      No follow-ups on file.      Problem List Items Addressed This Visit       Other   Environmental and seasonal allergies - Primary    Acute, recurrent She reports seasonal allergies that flare with pollen production  Suspect this is likely cause of her ear concerns and mid ear effusion We discussed starting consistent regimen of Flonase and OTC antihistamine per preference to provide more consistent and lasting relief of symptoms Recommend adding ophthalmic antihistamine for symptomatic relief of itchy, watery eyes Discussed reducing allergens in home- washing surfaces, linens, floors more frequently during pollen season Follow up as needed for persistent or progressing symptoms        Other Visit Diagnoses     Middle ear effusion, bilateral            No follow-ups on file.   I, Alaynna Kerwood E Autumn Pruitt, PA-C, have reviewed all documentation for this visit. The documentation on 04/23/22 for the exam, diagnosis, procedures, and orders are all accurate and complete.   Talitha Givens, MHS, PA-C [facility]

## 2022-04-23 NOTE — Assessment & Plan Note (Signed)
Acute, recurrent She reports seasonal allergies that flare with pollen production  Suspect this is likely cause of her ear concerns and mid ear effusion We discussed starting consistent regimen of Flonase and OTC antihistamine per preference to provide more consistent and lasting relief of symptoms Recommend adding ophthalmic antihistamine for symptomatic relief of itchy, watery eyes Discussed reducing allergens in home- washing surfaces, linens, floors more frequently during pollen season Follow up as needed for persistent or progressing symptoms

## 2022-04-23 NOTE — Patient Instructions (Addendum)
  To help with your allergies I recommend the following:  I also recommend adding an antihistamine to your daily regimen This includes medications like Claritin, Allegra, Zyrtec- the generics of these work very well and are usually less expensive I recommend using Flonase nasal spray - 2 puffs twice per day to help with your nasal congestion The antihistamines and Flonase can take a few weeks to provide significant relief from allergy symptoms but should start to provide some benefit soon.  You can use an antihistamine eye drop such as Ketotifen or Olopatadine (these are available over the counter) along with lubricating eye drops to help with eye symptoms   It was nice to meet you and I appreciate the opportunity to be involved in your care If you were satisfied with the care you received from me, I would greatly appreciate you saying so in the after-visit survey that is sent out following our visit.

## 2022-05-12 ENCOUNTER — Encounter: Payer: Self-pay | Admitting: Nurse Practitioner

## 2022-05-12 ENCOUNTER — Other Ambulatory Visit (HOSPITAL_COMMUNITY)
Admission: RE | Admit: 2022-05-12 | Discharge: 2022-05-12 | Disposition: A | Discharge status: Home / Self Care | Payer: 59 | Source: Ambulatory Visit | Attending: Nurse Practitioner | Admitting: Nurse Practitioner

## 2022-05-12 ENCOUNTER — Ambulatory Visit (INDEPENDENT_AMBULATORY_CARE_PROVIDER_SITE_OTHER): Payer: 59 | Admitting: Nurse Practitioner

## 2022-05-12 VITALS — BP 134/72 | HR 85 | Temp 98.2°F | Ht 72.0 in | Wt 390.6 lb

## 2022-05-12 DIAGNOSIS — Z0000 Encounter for general adult medical examination without abnormal findings: Secondary | ICD-10-CM | POA: Diagnosis present

## 2022-05-12 DIAGNOSIS — J069 Acute upper respiratory infection, unspecified: Secondary | ICD-10-CM | POA: Diagnosis not present

## 2022-05-12 DIAGNOSIS — F419 Anxiety disorder, unspecified: Secondary | ICD-10-CM

## 2022-05-12 DIAGNOSIS — Z136 Encounter for screening for cardiovascular disorders: Secondary | ICD-10-CM

## 2022-05-12 DIAGNOSIS — F322 Major depressive disorder, single episode, severe without psychotic features: Secondary | ICD-10-CM

## 2022-05-12 DIAGNOSIS — Z1159 Encounter for screening for other viral diseases: Secondary | ICD-10-CM

## 2022-05-12 DIAGNOSIS — Z6841 Body Mass Index (BMI) 40.0 and over, adult: Secondary | ICD-10-CM

## 2022-05-12 LAB — URINALYSIS, ROUTINE W REFLEX MICROSCOPIC
Glucose, UA: NEGATIVE
Ketones, UA: NEGATIVE
Leukocytes,UA: NEGATIVE
Nitrite, UA: NEGATIVE
RBC, UA: NEGATIVE
Specific Gravity, UA: >1.03 — ABNORMAL HIGH (ref 1.005–1.030)
Urobilinogen, Ur: 2 mg/dL — ABNORMAL HIGH (ref 0.2–1.0)
pH, UA: 6 (ref 5.0–7.5)

## 2022-05-12 LAB — MICROSCOPIC EXAMINATION: Bacteria, UA: NONE SEEN

## 2022-05-12 MED ORDER — NORETHIN ACE-ETH ESTRAD-FE 1-20 MG-MCG PO TABS: 1.0000 | ORAL_TABLET | Freq: Every day | ORAL | 3 refills | Status: AC

## 2022-05-12 MED ORDER — SERTRALINE HCL 25 MG PO TABS: 25.0000 mg | ORAL_TABLET | Freq: Every day | ORAL | 0 refills | Status: DC

## 2022-05-12 MED ORDER — ALBUTEROL SULFATE HFA 108 (90 BASE) MCG/ACT IN AERS
2.0000 | INHALATION_SPRAY | Freq: Four times a day (QID) | RESPIRATORY_TRACT | 0 refills | Status: DC | PRN
Start: 2022-05-12 — End: 2022-12-14

## 2022-05-12 NOTE — Assessment & Plan Note (Signed)
Chronic.  Ongoing.  Will restart Zoloft  daily for two weeks then increase to  daily.  Follow up in 1 month.  Call sooner if concerns arise.

## 2022-05-12 NOTE — Assessment & Plan Note (Signed)
Recommended eating smaller high protein, low fat meals more frequently and exercising 30 mins a day 5 times a week with a goal of 10-15lb weight loss in the next 3 months. Referral placed for medical weight management.

## 2022-05-12 NOTE — Progress Notes (Signed)
BP 134/72   Pulse 85   Temp 98.2 F (36.8 C) (Oral)   Ht 6' (1.829 m)   Wt (!) 390 lb 9.6 oz (177.2 kg)   SpO2 98%   BMI 52.97 kg/m    Subjective:    Patient ID: Claire Walker, female    DOB: July 18, 1995, 27 y.o.   MRN: [id number]  HPI: Claire Walker is a 27 y.o. female presenting on 05/12/2022 for comprehensive medical examination. Current medical complaints include: Depression  She currently lives with: Menopausal Symptoms: no  DEPRESSION/ANXIETY Patient states she has not been taking her Zoloft for several months.  Patient states she would like to restart her Zoloft.  She has been doing okay but feels like the medication really helps her a lot.  Denies SI.  Depression Screen done today and results listed below:     05/12/2022    3:03 PM 04/23/2022    3:42 PM 01/16/2021   11:07 AM 05/20/2020    3:48 PM 04/01/2020    4:18 PM  Depression screen PHQ 2/9  Decreased Interest 2 0 0 1 3  Down, Depressed, Hopeless 2 0 0 1 2  PHQ - 2 Score 4 0 0 2 5  Altered sleeping 3 0 1 2 3   Tired, decreased energy 3 0 1 2 3   Change in appetite 3 3 1 2 3   Feeling bad or failure about yourself  3 0 1 1 2   Trouble concentrating 3 1 1 1 3   Moving slowly or fidgety/restless 0 0 1 1 3   Suicidal thoughts 0 0 0 0 0  PHQ-9 Score 19 4 6 11 22   Difficult doing work/chores Very difficult Not difficult at all Not difficult at all Somewhat difficult Extremely dIfficult    The patient does not have a history of falls. I did complete a risk assessment for falls. A plan of care for falls was documented.   Past Medical History:  Past Medical History:  Diagnosis Date   Allergy    Anxiety    Depression    Gestational hypertension    Morbid obesity with BMI of 45.0-49.9, adult 04/07/2019   Normal vaginal delivery 02/26/2019   Obesity affecting pregnancy 01/11/2019   Supervision of high risk pregnancy, antepartum 01/11/2019   Clinic [facility] Prenatal Labs Dating 32 week ultrasoud Blood type: O/Positive/--  (12/16 1532)  Genetic Screen Late onet, not done Antibody:Negative (12/16 1532) Anatomic [place] Normal, anterior placenta Rubella: 2.16 (12/16 1532) Varicella:Immune GTT Third trimester: Hemoglobin A1C 5.1% RPR: Non Reactive (12/16 1532)  Rhogam n/a HBsAg: Negative (12/16 1532)  TDaP vaccine      01/25/19                HIV    Surgical History:  Past Surgical History:  Procedure Laterality Date   NO PAST SURGERIES      Medications:  No current outpatient medications on file prior to visit.   No current facility-administered medications on file prior to visit.    Allergies:  No Known Allergies  Social History:  Social History   Socioeconomic History   Marital status: Single    Spouse name: Not on file   Number of children: 1   Years of education: Not on file   Highest education level: Not on file  Occupational History   Occupation: [employer]  Tobacco Use   Smoking status: Never   Smokeless tobacco: Never  Vaping Use   Vaping Use: Never used  Substance and Sexual Activity  Alcohol use: No   Drug use: No   Sexual activity: Yes    Birth control/protection: Pill, None    Comment: Junel OCP  Other Topics Concern   Not on file  Social History Narrative   Not on file   Social Determinants of Health   Financial Resource Strain: Not on file  Food Insecurity: Not on file  Transportation Needs: Not on file  Physical Activity: Not on file  Stress: Not on file  Social Connections: Not on file  Intimate Partner Violence: Not on file   Social History   Tobacco Use  Smoking Status Never  Smokeless Tobacco Never   Social History   Substance and Sexual Activity  Alcohol Use No    Family History:  Family History  Problem Relation Age of Onset   Arthritis Mother    Asthma Mother    Hypertension Mother    Thyroid disease Mother    Anxiety disorder Mother    Diabetes Mother    Varicose Veins Mother    Diabetes Father    Early death Father    Stroke Father     Hypertension Maternal Grandmother    Cancer Maternal Grandfather        squamous cell carcinoma   Hypertension Maternal Grandfather    Diabetes Maternal Grandfather    Anxiety disorder Maternal Grandfather    Heart disease Maternal Grandfather    Hypertension Paternal Grandmother    Hypertension Paternal Grandfather    Heart disease Paternal Grandfather    Cancer Paternal Aunt    Diabetes Paternal Aunt    Cancer Maternal Aunt     Past medical history, surgical history, medications, allergies, family history and social history reviewed with patient today and changes made to appropriate areas of the chart.   Review of Systems  Psychiatric/Behavioral:  Positive for depression. Negative for suicidal ideas. The patient is nervous/anxious.    All other ROS negative except what is listed above and in the HPI.      Objective:    BP 134/72   Pulse 85   Temp 98.2 F (36.8 C) (Oral)   Ht 6' (1.829 m)   Wt (!) 390 lb 9.6 oz (177.2 kg)   SpO2 98%   BMI 52.97 kg/m   Wt Readings from Last 3 Encounters:  05/12/22 (!) 390 lb 9.6 oz (177.2 kg)  04/23/22 (!) 389 lb (176.4 kg)  04/15/21 (!) 374 lb 3.2 oz (169.7 kg)    Physical Exam Vitals and nursing note reviewed. Exam conducted with a chaperone present Wilhemena Durie, CMA).  Constitutional:      General: She is awake. She is not in acute distress.    Appearance: Normal appearance. She is well-developed. She is obese. She is not ill-appearing.  HENT:     Head: Normocephalic and atraumatic.     Right Ear: Hearing, tympanic membrane, ear canal and external ear normal. No drainage.     Left Ear: Hearing, tympanic membrane, ear canal and external ear normal. No drainage.     Nose: Nose normal.     Right Sinus: No maxillary sinus tenderness or frontal sinus tenderness.     Left Sinus: No maxillary sinus tenderness or frontal sinus tenderness.     Mouth/Throat:     Mouth: Mucous membranes are moist.     Pharynx: Oropharynx is clear.  Uvula midline. No pharyngeal swelling, oropharyngeal exudate or posterior oropharyngeal erythema.  Eyes:     General: Lids are normal.  Right eye: No discharge.        Left eye: No discharge.     Extraocular Movements: Extraocular movements intact.     Conjunctiva/sclera: Conjunctivae normal.     Pupils: Pupils are equal, round, and reactive to light.     Visual Fields: Right eye visual fields normal and left eye visual fields normal.  Neck:     Thyroid: No thyromegaly.     Vascular: No carotid bruit.     Trachea: Trachea normal.  Cardiovascular:     Rate and Rhythm: Normal rate and regular rhythm.     Heart sounds: Normal heart sounds. No murmur heard.    No gallop.  Pulmonary:     Effort: Pulmonary effort is normal. No accessory muscle usage or respiratory distress.     Breath sounds: Normal breath sounds.  Chest:  Breasts:    Right: Normal.     Left: Normal.  Abdominal:     General: Bowel sounds are normal.     Palpations: Abdomen is soft. There is no hepatomegaly or splenomegaly.     Tenderness: There is no abdominal tenderness.  Genitourinary:    Vagina: Normal.     Cervix: Normal.     Adnexa: Right adnexa normal and left adnexa normal.  Musculoskeletal:        General: Normal range of motion.     Cervical back: Normal range of motion and neck supple.     Right lower leg: No edema.     Left lower leg: No edema.  Lymphadenopathy:     Head:     Right side of head: No submental, submandibular, tonsillar, preauricular or posterior auricular adenopathy.     Left side of head: No submental, submandibular, tonsillar, preauricular or posterior auricular adenopathy.     Cervical: No cervical adenopathy.     Upper Body:     Right upper body: No supraclavicular, axillary or pectoral adenopathy.     Left upper body: No supraclavicular, axillary or pectoral adenopathy.  Skin:    General: Skin is warm and dry.     Capillary Refill: Capillary refill takes less than 2  seconds.     Findings: No rash.  Neurological:     Mental Status: She is alert and oriented to person, place, and time.     Gait: Gait is intact.  Psychiatric:        Attention and Perception: Attention normal.        Mood and Affect: Mood normal.        Speech: Speech normal.        Behavior: Behavior normal. Behavior is cooperative.        Thought Content: Thought content normal.        Judgment: Judgment normal.     Results for orders placed or performed in visit on 04/15/21  Rapid Strep Screen ([facility] ONLY)   Specimen: Other   Other  Result Value Ref Range   Strep Gp A Ag, IA W/Reflex Negative Negative  Novel Coronavirus, NAA ([facility])   Specimen: Nasopharyngeal(NP) swabs in vial transport medium  Result Value Ref Range   SARS-CoV-2, NAA Not Detected Not Detected  Culture, Group A Strep   Other  Result Value Ref Range   Strep A Culture Negative   Veritor Flu A/B Waived  Result Value Ref Range   Influenza A Negative Negative   Influenza B Negative Negative      Assessment & Plan:   Problem List Items Addressed  This Visit       Respiratory   Upper respiratory tract infection   Relevant Medications   albuterol (VENTOLIN HFA) 108 (90 Base) MCG/ACT inhaler     Other   Depression, major, single episode, severe    Chronic.  Ongoing.  Will restart Zoloft 25mg  daily for two weeks then increase to 50mg  daily.  Follow up in 1 month.  Call sooner if concerns arise.       Relevant Medications   sertraline (ZOLOFT) 25 MG tablet   Morbid obesity    Recommended eating smaller high protein, low fat meals more frequently and exercising 30 mins a day 5 times a week with a goal of 10-15lb weight loss in the next 3 months. Referral placed for medical weight management.       Relevant Orders   Amb Ref to Medical Weight Management   Anxiety    Chronic.  Ongoing.  Will restart Zoloft 25mg  daily for two weeks then increase to 50mg  daily.  Follow up in 1 month.  Call  sooner if concerns arise.       Relevant Medications   sertraline (ZOLOFT) 25 MG tablet   Other Visit Diagnoses     Annual physical exam    -  Primary   Health maintenance reviewed during visit today.  Labs ordered.  PAP done.  Vaccines reviewed.   Relevant Orders   Cytology - PAP   CBC with Differential/Platelet   Comprehensive metabolic panel   Lipid panel   TSH   Urinalysis, Routine w reflex microscopic   Screening for ischemic heart disease       Relevant Orders   Lipid panel   Encounter for hepatitis C screening test for low risk patient       Relevant Orders   Hepatitis C Antibody        Follow up plan: Return in about 1 month (around 06/11/2022) for Depression/Anxiety FU.   LABORATORY TESTING:  - Pap smear: pap done  IMMUNIZATIONS:   - Tdap: Tetanus vaccination status reviewed: last tetanus booster within 10 years. - Influenza: Postponed to flu season - Pneumovax: Not applicable - Prevnar: Not applicable - COVID: Not applicable - HPV: Not applicable - Shingrix vaccine: Not applicable  SCREENING: -Mammogram: Not applicable  - Colonoscopy: Not applicable  - Bone Density: Not applicable  -Hearing Test: Not applicable  -Spirometry: Not applicable   PATIENT COUNSELING:   Advised to take 1 mg of folate supplement per day if capable of pregnancy.   Sexuality: Discussed sexually transmitted diseases, partner selection, use of condoms, avoidance of unintended pregnancy  and contraceptive alternatives.   Advised to avoid cigarette smoking.  I discussed with the patient that most people either abstain from alcohol or drink within safe limits (<=14/week and <=4 drinks/occasion for males, <=7/weeks and <= 3 drinks/occasion for females) and that the risk for alcohol disorders and other health effects rises proportionally with the number of drinks per week and how often a drinker exceeds daily limits.  Discussed cessation/primary prevention of drug use and  availability of treatment for abuse.   Diet: Encouraged to adjust caloric intake to maintain  or achieve ideal body weight, to reduce intake of dietary saturated fat and total fat, to limit sodium intake by avoiding high sodium foods and not adding table salt, and to maintain adequate dietary potassium and calcium preferably from fresh fruits, vegetables, and low-fat dairy products.    stressed the importance of regular exercise  Injury prevention: Discussed  safety belts, safety helmets, smoke detector, smoking near bedding or upholstery.   Dental health: Discussed importance of regular tooth brushing, flossing, and dental visits.    NEXT PREVENTATIVE PHYSICAL DUE IN 1 YEAR. Return in about 1 month (around 06/11/2022) for Depression/Anxiety FU.

## 2022-05-12 NOTE — Assessment & Plan Note (Signed)
Chronic.  Ongoing.  Will restart Zoloft 25mg daily for two weeks then increase to 50mg daily.  Follow up in 1 month.  Call sooner if concerns arise.  

## 2022-05-13 ENCOUNTER — Encounter: Payer: Self-pay | Admitting: Nurse Practitioner

## 2022-05-13 NOTE — Progress Notes (Signed)
Hi Benelli.  Your urine sample from yesterday looks good.

## 2022-05-15 ENCOUNTER — Other Ambulatory Visit: Payer: BC Managed Care – PPO

## 2022-05-15 DIAGNOSIS — Z136 Encounter for screening for cardiovascular disorders: Secondary | ICD-10-CM

## 2022-05-15 DIAGNOSIS — Z0000 Encounter for general adult medical examination without abnormal findings: Secondary | ICD-10-CM

## 2022-05-15 DIAGNOSIS — Z1159 Encounter for screening for other viral diseases: Secondary | ICD-10-CM

## 2022-05-15 LAB — URINALYSIS, ROUTINE W REFLEX MICROSCOPIC
Bilirubin, UA: NEGATIVE
Glucose, UA: NEGATIVE
Ketones, UA: NEGATIVE
Nitrite, UA: NEGATIVE
Protein,UA: NEGATIVE
RBC, UA: NEGATIVE
Specific Gravity, UA: 1.015 (ref 1.005–1.030)
Urobilinogen, Ur: 0.2 mg/dL (ref 0.2–1.0)
pH, UA: 7 (ref 5.0–7.5)

## 2022-05-15 LAB — MICROSCOPIC EXAMINATION: Bacteria, UA: NONE SEEN

## 2022-05-15 LAB — CYTOLOGY - PAP: Diagnosis: NEGATIVE

## 2022-05-18 NOTE — Progress Notes (Signed)
Hi Luv. Your PAP was normal.  We will repeat it in 3 years.

## 2022-06-04 ENCOUNTER — Encounter (INDEPENDENT_AMBULATORY_CARE_PROVIDER_SITE_OTHER): Payer: Self-pay | Admitting: Family Medicine

## 2022-06-04 ENCOUNTER — Ambulatory Visit (INDEPENDENT_AMBULATORY_CARE_PROVIDER_SITE_OTHER): Payer: Self-pay | Admitting: Family Medicine

## 2022-06-04 VITALS — BP 137/79 | HR 78 | Temp 98.6°F | Ht 71.0 in | Wt 385.0 lb

## 2022-06-04 DIAGNOSIS — F39 Unspecified mood [affective] disorder: Secondary | ICD-10-CM

## 2022-06-04 DIAGNOSIS — N926 Irregular menstruation, unspecified: Secondary | ICD-10-CM

## 2022-06-04 DIAGNOSIS — Z6841 Body Mass Index (BMI) 40.0 and over, adult: Secondary | ICD-10-CM

## 2022-06-04 DIAGNOSIS — Z0289 Encounter for other administrative examinations: Secondary | ICD-10-CM

## 2022-06-04 NOTE — Progress Notes (Signed)
Carlye Grippe, DO, ABFM, ABOM Department of Walker Medicine  [facility] [address] [place], [place], [place] [address] Office: [phone]  /  Fax: [phone]   Initial Visit  Claire Walker was seen in clinic today to evaluate for Walker. She is interested in losing    weight to improve overall health and reduce the risk of weight related complications. She presents today to review program treatment options, initial physical assessment, and evaluation.     She was referred by: PCP  When asked what else they would they like to accomplish? She states: Improve quality of life  When asked how has your weight affected you? She states: Contributed to orthopedic problems or mobility issues and Has affected mood   Some associated conditions: Irregular Menses  Contributing factors: Stress, Life event, and Other: Toxic Relationships  Weight promoting medications identified: Contraceptives or hormonal therapy  Current nutrition plan: Within the last week and a half she has been practicing portion control, mindful Walker, cutting out soda. Prior to this, she endorses Walker out for almost every meal and preparing fried foods/unhealthy meals at home.   Current level of physical activity: None  Current or previous pharmacotherapy: None  Response to medication: N/A   Past medical history includes:   Past Medical History:  Diagnosis Date   Allergy    Anxiety    Depression    Gestational hypertension    Morbid Walker with BMI of 45.0-49.9, adult (HCC) 04/07/2019   Normal vaginal delivery 02/26/2019   Walker affecting pregnancy 01/11/2019   Supervision of high risk pregnancy, antepartum 01/11/2019   [facility] Prenatal Labs Dating 32 week ultrasoud Blood type: O/Positive/-- (12/16 1532)  Genetic Screen Late onet, not done Antibody:Negative (12/16 1532) Anatomic [place] Normal, anterior placenta Rubella: 2.16 (12/16 1532) Varicella:Immune GTT Third  trimester: Hemoglobin A1C 5.1% RPR: Non Reactive (12/16 1532)  Rhogam n/a HBsAg: Negative (12/16 1532)  TDaP vaccine      01/25/19                HIV    Reviewed by clinician on day of visit: allergies, medications, problem list, medical history, surgical history, family history, social history, and previous encounter notes pertinent to Walker diagnosis.  Objective:   BP 137/79   Pulse 78   Temp 98.6 F (37 C)   Ht 5\' 11"  (1.803 m)   Wt (!) 385 lb (174.6 kg)   SpO2 98%   BMI 53.70 kg/m  She was weighed on the bioimpedance scale: Body mass index is 53.7 kg/m.  Visceral Fat %: 19, Body Fat %: 55   General: Well Developed, well nourished, and in no acute distress.  HEENT: Normocephalic, atraumatic Skin: Warm and dry, cap RF less 2 sec, good turgor Chest:  Normal excursion, shape, no gross abn Respiratory: speaking in full sentences, no conversational dyspnea NeuroM-Sk: Ambulates w/o assistance, moves * 4 Psych: A and O *3, insight good, mood-full   Assessment and Plan:   Mood disorder (HCC) - Emotional Walker Assessment: Condition is stable.  - Denies any SI/HI. Mood is stable. - Patient endorses that she has h/o depression and anxiety.  - Endorses that she does emotional Walker.  - No issues with Zoloft 25 mg daily. Denies any side effects. - She endorses that she has been on this medication "for a while".  - She has never seen a counselor/life coach for her depression/anxiety.   Plan: - If patient decides to join program, we  will check Thyroid panel, Vitamin D, B12, and CMP - Continue with med as prescribed by PCP  - Discussed with patient that weight loss/ Walker healthier can improve her mood as well as help with her overall emotional wellbeing.  - I informed patient about Dr.Barker, our Bariatric Psychologist, who can help Claire Walker if she decides to join the program.   Irregular menses Assessment: Condition is stable. -Pt has noticed irregularities  in her menstrual cycle recently. She recently went off her birth control pill, which could be contributing to this.   Plan: - Disccused how PCOS is a common finding in many young obese women. Counseling done, explained how weight loss will most likely imporve this condiiton.  - Continue meeting with PCP/GYN regarding birth control methods.    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   Fat mass is 212 lbs.  Muscle mass is 164.8 lb. Total body water is 131.8 lbs.   Plan:  She will work on gathering support from family and friends to begin weight loss journey. Work on eliminating or reducing the presence of highly processed, calorie dense foods in the home. Complete provided nutritional and psychosocial assessment questionnaire prior to her next visit.   Claire Walker may follow up in the next 1-2 Walker to review the above steps and continue evaluation and treatment of Claire Walker  Walker Education Performed Today: She was weighed on the bioimpedance scale and results were discussed and documented above  We discussed Walker as a disease and the importance of a more detailed evaluation of all the factors contributing to the disease.  We discussed the importance of long term lifestyle changes which include nutrition, exercise and behavioral modifications as well as the importance of customizing this to her specific health and social needs.  We discussed the benefits of reaching a healthier weight to alleviate the symptoms of existing conditions and reduce the risks of the biomechanical, metabolic and psychological effects of Walker.  Claire Walker and states they are ready to start intensive lifestyle modifications and behavioral modifications.  50 minutes was spent today on this visit including the above  counseling, pre-visit chart review, and post-visit documentation.  Attestations:   Reviewed by clinician on day of visit: allergies, medications, problem list, medical history, surgical history, family history, social history, and previous encounter notes.  I,Special Puri,acting as a Neurosurgeon for [employer], DO.,have documented all relevant documentation on the behalf of Thomasene Lot, DO,as directed by  Thomasene Lot, DO while in the presence of Thomasene Lot, DO.  I, Thomasene Lot, DO, have reviewed all documentation for this visit. The documentation on 06/04/22 for the exam, diagnosis, procedures, and orders are all accurate and complete.

## 2022-06-16 ENCOUNTER — Encounter: Payer: Self-pay | Admitting: Nurse Practitioner

## 2022-06-16 ENCOUNTER — Ambulatory Visit (INDEPENDENT_AMBULATORY_CARE_PROVIDER_SITE_OTHER): Payer: 59 | Admitting: Nurse Practitioner

## 2022-06-16 VITALS — BP 125/81 | HR 72 | Temp 98.7°F | Wt 387.6 lb

## 2022-06-16 DIAGNOSIS — F419 Anxiety disorder, unspecified: Secondary | ICD-10-CM

## 2022-06-16 DIAGNOSIS — F322 Major depressive disorder, single episode, severe without psychotic features: Secondary | ICD-10-CM

## 2022-06-16 MED ORDER — SERTRALINE HCL 50 MG PO TABS: 50.0000 mg | ORAL_TABLET | Freq: Every day | ORAL | 1 refills | Status: DC

## 2022-06-16 NOTE — Assessment & Plan Note (Signed)
Chronic.  Controlled.  Continue with current medication regimen of Zoloft 50mg  daily.  Refills sent today.  Return to clinic in 6 months for reevaluation.  Call sooner if concerns arise.

## 2022-06-16 NOTE — Progress Notes (Signed)
BP 125/81   Pulse 72   Temp 98.7 F (37.1 C) (Oral)   Wt (!) 387 lb 9.6 oz (175.8 kg)   SpO2 98%   BMI 54.06 kg/m    Subjective:    Patient ID: Claire Walker, female    DOB: 25-Nov-1995, 27 y.o.   MRN: [id number]  HPI: Claire Walker is a 27 y.o. female  Chief Complaint  Patient presents with   Depression   DEPRESSION/ANXIETY Patient states she she is taking the Zoloft.  She states it is working great for her.  She is taking 50mg  and feels like it is a good dose for her.  She denies concerns at visit today.       Relevant past medical, surgical, family and social history reviewed and updated as indicated. Interim medical history since our last visit reviewed. Allergies and medications reviewed and updated.  Review of Systems  Psychiatric/Behavioral:  Positive for dysphoric mood. Negative for suicidal ideas. The patient is nervous/anxious.     Per HPI unless specifically indicated above     Objective:    BP 125/81   Pulse 72   Temp 98.7 F (37.1 C) (Oral)   Wt (!) 387 lb 9.6 oz (175.8 kg)   SpO2 98%   BMI 54.06 kg/m   Wt Readings from Last 3 Encounters:  06/16/22 (!) 387 lb 9.6 oz (175.8 kg)  06/04/22 (!) 385 lb (174.6 kg)  05/12/22 (!) 390 lb 9.6 oz (177.2 kg)    Physical Exam Vitals and nursing note reviewed.  Constitutional:      General: She is not in acute distress.    Appearance: Normal appearance. She is obese. She is not ill-appearing, toxic-appearing or diaphoretic.  HENT:     Head: Normocephalic.     Right Ear: External ear normal.     Left Ear: External ear normal.     Nose: Nose normal.     Mouth/Throat:     Mouth: Mucous membranes are moist.     Pharynx: Oropharynx is clear.  Eyes:     General:        Right eye: No discharge.        Left eye: No discharge.     Extraocular Movements: Extraocular movements intact.     Conjunctiva/sclera: Conjunctivae normal.     Pupils: Pupils are equal, round, and reactive to light.  Cardiovascular:      Rate and Rhythm: Normal rate and regular rhythm.     Heart sounds: No murmur heard. Pulmonary:     Effort: Pulmonary effort is normal. No respiratory distress.     Breath sounds: Normal breath sounds. No wheezing or rales.  Musculoskeletal:     Cervical back: Normal range of motion and neck supple.  Skin:    General: Skin is warm and dry.     Capillary Refill: Capillary refill takes less than 2 seconds.  Neurological:     General: No focal deficit present.     Mental Status: She is alert and oriented to person, place, and time. Mental status is at baseline.  Psychiatric:        Mood and Affect: Mood normal.        Behavior: Behavior normal.        Thought Content: Thought content normal.        Judgment: Judgment normal.     Results for orders placed or performed in visit on 05/15/22  Microscopic Examination   Urine  Result Value Ref Range  WBC, UA 0-5 0 - 5 /hpf   RBC, Urine 0-2 0 - 2 /hpf   Epithelial Cells (non renal) 0-10 0 - 10 /hpf   Bacteria, UA None seen None seen/Few  Urinalysis, Routine w reflex microscopic  Result Value Ref Range   Specific Gravity, UA 1.015 1.005 - 1.030   pH, UA 7.0 5.0 - 7.5   Color, UA Yellow Yellow   Appearance Ur Cloudy (A) Clear   Leukocytes,UA 1+ (A) Negative   Protein,UA Negative Negative/Trace   Glucose, UA Negative Negative   Ketones, UA Negative Negative   RBC, UA Negative Negative   Bilirubin, UA Negative Negative   Urobilinogen, Ur 0.2 0.2 - 1.0 mg/dL   Nitrite, UA Negative Negative   Microscopic Examination See below:       Assessment & Plan:   Problem List Items Addressed This Visit       Other   Depression, major, single episode, severe (HCC) - Primary    Chronic.  Controlled.  Continue with current medication regimen of Zoloft 50mg  daily.  Refills sent today.  Return to clinic in 6 months for reevaluation.  Call sooner if concerns arise.        Relevant Medications   sertraline (ZOLOFT) 50 MG tablet   Anxiety     Chronic.  Controlled.  Continue with current medication regimen of Zoloft 50mg  daily.  Refills sent today.  Return to clinic in 6 months for reevaluation.  Call sooner if concerns arise.       Relevant Medications   sertraline (ZOLOFT) 50 MG tablet     Follow up plan: Return in about 6 months (around 12/17/2022) for Depression/Anxiety FU.

## 2022-06-16 NOTE — Assessment & Plan Note (Signed)
Chronic.  Controlled.  Continue with current medication regimen of Zoloft 50mg daily.  Refills sent today.  Return to clinic in 6 months for reevaluation.  Call sooner if concerns arise.   

## 2022-06-23 ENCOUNTER — Encounter (INDEPENDENT_AMBULATORY_CARE_PROVIDER_SITE_OTHER): Payer: Self-pay | Admitting: Family Medicine

## 2022-06-23 ENCOUNTER — Ambulatory Visit (INDEPENDENT_AMBULATORY_CARE_PROVIDER_SITE_OTHER): Payer: 59 | Admitting: Family Medicine

## 2022-06-23 VITALS — BP 132/81 | HR 75 | Temp 97.6°F | Ht 72.0 in | Wt 385.0 lb

## 2022-06-23 DIAGNOSIS — J45909 Unspecified asthma, uncomplicated: Secondary | ICD-10-CM

## 2022-06-23 DIAGNOSIS — R0602 Shortness of breath: Secondary | ICD-10-CM

## 2022-06-23 DIAGNOSIS — R600 Localized edema: Secondary | ICD-10-CM | POA: Insufficient documentation

## 2022-06-23 DIAGNOSIS — E65 Localized adiposity: Secondary | ICD-10-CM | POA: Diagnosis not present

## 2022-06-23 DIAGNOSIS — R5383 Other fatigue: Secondary | ICD-10-CM | POA: Diagnosis not present

## 2022-06-23 DIAGNOSIS — Z6841 Body Mass Index (BMI) 40.0 and over, adult: Secondary | ICD-10-CM

## 2022-06-23 DIAGNOSIS — F39 Unspecified mood [affective] disorder: Secondary | ICD-10-CM | POA: Diagnosis not present

## 2022-06-23 DIAGNOSIS — Z1331 Encounter for screening for depression: Secondary | ICD-10-CM

## 2022-06-23 NOTE — Progress Notes (Signed)
Carlye Grippe, D.O.  ABFM, ABOM Specializing in Clinical Bariatric Medicine Office located at: [address] [place], [place]  [address]     Initial Bariatric Medicine Consultation Visit  Dear Larae Grooms, NP   Thank you for referring Claire Walker to our clinic today for evaluation.  We performed a consultation to discuss her options for treatment and educate the patient on her disease state.  The following note includes my evaluation and treatment recommendations.   Please do not hesitate to reach out to me directly if you have any further concerns.    Assessment and Plan:   Orders Placed This Encounter  Procedures   VITAMIN D 25 Hydroxy (Vit-D Deficiency, Fractures)   TSH   T4, free   Lipid Panel With LDL/HDL Ratio   Insulin, random   Hemoglobin A1c   Folate   Comprehensive metabolic panel   CBC with Differential/Platelet   Vitamin B12   EKG 12-Lead    1) Fatigue Assessment: Condition is Not optimized. Else does feel that her weight is causing her energy to be lower than it should be. Fatigue may be related to obesity, depression or many other causes. she does not appear to have any red flag symptoms and this appears to most likely related to her current lifestyle habits and dietary intake.  Plan:  Labs will be ordered and reviewed with her at their next office visit in two weeks. Epworth sleepiness scale score appears to be within normal limits.  Her ESS score is 7.   Claire Walker admits to occasional daytime somnolence and admits to waking up still tired.  Claire Walker generally gets 6 or 7 hours of sleep per night, and states that she has generally restful sleep. Snoring is occasionally present. Pt reports having an apneic episode one in her life.    I highly encouraged patient to aim for 7-9 hrs of sleep for adequate weight loss progression.   ECG: Normal sinus rhythm, rate 73 bpm; reassuring without any acute abnormalities, will continue to monitor for symptoms      2) Shortness of breath on exertion Assessment: Condition is Not optimized. Claire Walker does feel that she gets out of breath more easily than she used to when she exercises and seems to be worsening over time with weight gain.  This has gotten worse recently. Claire Walker denies shortness of breath at rest or orthopnea. Claire Walker's shortness of breath appears to be obesity related and exercise induced, as they do not appear to have any "red flag" symptoms/ concerns today.  Also, this condition appears to be related to a state of poor cardiovascular conditioning   Plan:  Obtain labs today and will be reviewed with her at their next office visit in two weeks. Indirect Calorimeter completed today to help guide our dietary regimen. It shows a VO2 of 394 and a REE of 2722.  Her calculated basal metabolic rate is 6045 thus her measured basal metabolic rate is better than expected. Patient agreed to work on weight loss at this time.  As Claire Walker progresses through our weight loss program, we will gradually increase exercise as tolerated to treat her current condition.   If Claire Walker follows our recommendations and loses 5-10% of their weight without improvement of her shortness of breath or if at any time, symptoms become more concerning, they agree to urgently follow up with their PCP/ specialist for further consideration/ evaluation.   Claire Walker verbalizes agreement with this plan.     Mood disorder (HCC) -  Emotional Eating Modified PHQ-9 Depression Screen: Her Food and Mood (modified PHQ-9) score was 24 . Assessment: Condition is stable.  - Denies any SI/HI.  - Patient states that her mood is stable on Zoloft 50 mg daily.  - Pt reports that when she does not take the medication she is more prone to "crying and being emotional".   - She also reports eating when stressed, as a reward. She also endorses eating extremely fast.  - Has excessive hunger in the morning and late at night.  Plan:  - Check labs.  - Continue with  mood medication as recommended by PCP.  - I informed pt about Dr. Dewaine Conger, our Bariatric Psychologist, who can help Claire Walker with her struggles with emotional eating. She may consider meeting with her in the future, but declines for today.  - Behavior modification techniques were discussed today to help deal with emotional  eating behaviors including taking a sip of water between each bite and only eating at the kitchen table.  - Start  her prudent nutritional plan that is low in simple carbohydrates, saturated fats and trans fats to goal of 5-10% weight loss to achieve significant health benefits.  - We will continue to monitor closely.     Reactive airway disease without complication, unspecified asthma severity, unspecified whether persistent Assessment: Condition is stable.. - Patient endorses that her reactive airway disease symptoms are stable.  - She uses Albuterol PRN per her PCP.   Plan: - Check labs - Continue with treatment as recommended by PCP.  - Will continue to monitor as it relates to her weight loss journey.    Visceral obesity Assessment: Condition is improving, but not optimized. Current visceral fat rating: 18.   Plan:  - The visceral fat rating should be < 12 in a female and < 10 in a female.  Visceral adipose tissue is a hormonally active component of total body fat. This body composition phenotype is associated with medical disorders such as metabolic syndrome, cardiovascular disease and several malignancies including prostate, breast, and colorectal cancers. -Starting goal: Lose 7-10% of weight via prudent nutritional plan and lifestyle changes.      TREATMENT PLAN FOR OBESITY: Class 3 severe obesity due to excess calories without serious comorbidity with body mass index (BMI) of 50.0 to 59.9 in adult Claire Walker) Assessment: Condition is improving. Biometric data collected today, was reviewed with patient.  - Since last OV on 06/04/22, Muscle mass has increased by 2 lb.  Fat mass has decreased by 2.4 lb.Total body water has decreased by 0.8 lb.  - Patient endorses that she has been working on walking more, cutting out her soda intake, and having protein shakes since her info session with me.   Plan:  - Check labs.  - Claire Walker will work on healthier eating habits and begin the Category 3 meal plan with breakfast and lunch options and with 4-6 ounces of lean protein at lunch.  Behavioral Intervention Additional resources provided today: category 3 meal plan information, breakfast options, and lunch options Evidence-based interventions for health behavior change were utilized today including the discussion of self monitoring techniques, problem-solving barriers and SMART goal setting techniques.   Regarding patient's less desirable eating habits and patterns, we employed the technique of small changes.  Pt will specifically work on: eating all the food on the meal plan and not weighing herself at home for next visit.    Recommended Physical Activity Goals Adamariz has been advised to gradually work up to  150 minutes of moderate intensity aerobic activity a week and strengthening exercises 2-3 times per week for cardiovascular health, weight loss maintenance and preservation of muscle mass.   She has agreed to continue their current level of activity  FOLLOW UP: Follow up in 2 weeks. She was informed of the importance of frequent follow up visits to maximize her success with intensive lifestyle modifications for her multiple health conditions.  Deaisa Bartunek is aware that we will review all of her lab results at our next visit.  She is aware that if anything is critical/ life threatening with the results, we will be contacting her via MyChart prior to the office visit to discuss management.    Chief Complaint:   OBESITY Claire Walker (MR# [id number]) is a 27 y.o. female who presents for evaluation and treatment of obesity and related comorbidities. Current BMI is Body  mass index is 52.22 kg/m. Claire Walker has been struggling with her weight for many years and has been unsuccessful in either losing weight, maintaining weight loss, or reaching her healthy weight goal.  Claire Walker is currently in the action stage of change and ready to dedicate time achieving and maintaining a healthier weight. Claire Walker is interested in becoming our patient and working on intensive lifestyle modifications including (but not limited to) diet and exercise for weight loss.  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 Patient is dating Claire Walker  and has 1 child. She lives with her 82 year old child, and Claire Walker 59 yr old   Claire Walker habits were reviewed today and are as follows:   - Started gaining weight after entering the work force due to increased stress/ financial responsibilities  - Wants to lose weight to look and feel better.   - Desires to be 230 lbs by 2026.   - Has tried [employer] in the past (boiled eggs, grape fruit, juice, etc..)  - Eats outside the home/fast food 3x per week.  - Likes to cook, craves fish, and snacks on potatoes chips, pretzel thins, humus, carrots, apples, and bananas.   - Dislikes cottage cheese and pickles.   - Worst food habit: fast food and over eating.  - Endorses having excessive hunger in the morning and late at night.  - She takes Bisoluife for birth control.  Subjective:   This is the patient's first visit at [facility].  The patient's NEW PATIENT PACKET that they filled out prior to today's office visit was reviewed at length and information from that paperwork was included within the following office visit note.    Included in the packet: current and past health history, medications, allergies, ROS, gynecologic history (women only), surgical history, family history, social history, weight history, weight loss surgery history (for those that have had weight loss surgery),  nutritional evaluation, mood and food questionnaire along with a depression screening (PHQ9) on all patients, an Epworth questionnaire, sleep habits questionnaire, patient life and health improvement goals questionnaire. These will all be scanned into the patient's chart under the "media" tab.   Review of Systems: Please refer to new patient packet scanned into media. Pertinent positives were addressed with patient today.  Objective:   PHYSICAL EXAM: Blood pressure 132/81, pulse 75, temperature 97.6 F (36.4 C), height 6' (1.829 m), weight (!) 385 lb (174.6 kg), last menstrual period 06/08/2022, SpO2 100 %. Body mass index is 52.22 kg/m. General: Well Developed, well nourished, and in no acute distress.  HEENT: Normocephalic,  atraumatic Skin: Warm and dry, cap RF less 2 sec, good turgor Chest:  Normal excursion, shape, no gross abn Respiratory: speaking in full sentences, no conversational dyspnea NeuroM-Sk: Ambulates w/o assistance, moves * 4 Psych: A and O *3, insight good, mood-full  Anthropometric Measurements Height: 6' (1.829 m) Weight: (!) 385 lb (174.6 kg) BMI (Calculated): 52.2 Starting Weight: 385 lb Peak Weight: 385 lb Waist Measurement : 57 inches   Body Composition  Body Fat %: 54.4 % Fat Mass (lbs): 209.6 lbs Muscle Mass (lbs): 166.8 lbs Total Body Water (lbs): 131 lbs Visceral Fat Rating : 18   Other Clinical Data RMR: [id number] Fasting: Yes Labs: Yes Today's Visit #: 1 Starting Date: 06/23/22    DIAGNOSTIC DATA REVIEWED:  BMET    Component Value Date/Time   NA 137 02/24/2019 2216   NA 138 01/04/2019 1532   K 3.9 02/24/2019 2216   CL 104 02/24/2019 2216   CO2 22 02/24/2019 2216   GLUCOSE 91 02/24/2019 2216   BUN 12 02/24/2019 2216   BUN 9 01/04/2019 1532   CREATININE 0.62 02/24/2019 2216   CALCIUM 9.5 02/24/2019 2216   GFRNONAA >60 02/24/2019 2216   GFRAA >60 02/24/2019 2216   Lab Results  Component Value Date   HGBA1C 5.1 01/04/2019   No  results found for: "INSULIN" No results found for: "TSH" CBC    Component Value Date/Time   WBC 22.6 (H) 02/27/2019 0523   RBC 3.47 (L) 02/27/2019 0523   HGB 10.7 (L) 02/27/2019 0523   HGB 12.0 01/04/2019 1532   HCT 31.8 (L) 02/27/2019 0523   HCT 34.1 01/04/2019 1532   PLT 196 02/27/2019 0523   PLT 202 01/04/2019 1532   MCV 91.6 02/27/2019 0523   MCV 91 01/04/2019 1532   MCH 30.8 02/27/2019 0523   MCHC 33.6 02/27/2019 0523   RDW 12.3 02/27/2019 0523   RDW 12.0 01/04/2019 1532   Iron Studies No results found for: "IRON", "TIBC", "FERRITIN", "IRONPCTSAT" Lipid Panel  No results found for: "CHOL", "TRIG", "HDL", "CHOLHDL", "VLDL", "LDLCALC", "LDLDIRECT" Hepatic Function Panel     Component Value Date/Time   PROT 6.9 02/24/2019 2216   PROT 6.2 01/04/2019 1532   ALBUMIN 3.3 (L) 02/24/2019 2216   ALBUMIN 3.9 01/04/2019 1532   AST 20 02/24/2019 2216   ALT 21 02/24/2019 2216   ALKPHOS 193 (H) 02/24/2019 2216   BILITOT 0.4 02/24/2019 2216   BILITOT <0.2 01/04/2019 1532   BILIDIR <0.1 01/10/2019 1317   IBILI NOT CALCULATED 01/10/2019 1317   No results found for: "TSH" Nutritional No results found for: "VD25OH"  Attestation Statements:   Reviewed by clinician on day of visit: allergies, medications, problem list, medical history, surgical history, family history, social history, and previous encounter notes.  During the visit, I independently reviewed the patient's EKG, bioimpedance scale results, and indirect calorimeter results. I used this information to tailor a meal plan for the patient that will help Emie Garbers to lose weight and will improve her obesity-related conditions going forward.  I performed a medically necessary appropriate examination and/or evaluation. I discussed the assessment and treatment plan with the patient. The patient was provided an opportunity to ask questions and all were answered. The patient agreed with the plan and demonstrated an understanding of  the instructions. Labs were ordered today (unless patient declined them) and will be reviewed with the patient at our next visit unless more critical results need to be addressed immediately. Clinical information was updated and documented  in the EMR.  Time spent on visit including pre-visit chart review and post-visit Walker was estimated to be 55 minutes.    I,Special Puri,acting as a Neurosurgeon for [employer], DO.,have documented all relevant documentation on the behalf of Thomasene Lot, DO,as directed by  Thomasene Lot, DO while in the presence of Thomasene Lot, DO.   I, Thomasene Lot, DO, have reviewed all documentation for this visit. The documentation on 06/23/22 for the exam, diagnosis, procedures, and orders are all accurate and complete.

## 2022-06-24 LAB — COMPREHENSIVE METABOLIC PANEL
ALT: 42 IU/L — ABNORMAL HIGH (ref 0–32)
AST: 37 IU/L (ref 0–40)
Albumin/Globulin Ratio: 1.9 (ref 1.2–2.2)
Albumin: 4.2 g/dL (ref 4.0–5.0)
Alkaline Phosphatase: 70 IU/L (ref 44–121)
BUN/Creatinine Ratio: 13 (ref 9–23)
BUN: 10 mg/dL (ref 6–20)
Bilirubin Total: 0.3 mg/dL (ref 0.0–1.2)
CO2: 21 mmol/L (ref 20–29)
Calcium: 9.6 mg/dL (ref 8.7–10.2)
Chloride: 104 mmol/L (ref 96–106)
Creatinine, Ser: 0.76 mg/dL (ref 0.57–1.00)
Globulin, Total: 2.2 g/dL (ref 1.5–4.5)
Glucose: 97 mg/dL (ref 70–99)
Potassium: 4.3 mmol/L (ref 3.5–5.2)
Sodium: 139 mmol/L (ref 134–144)
Total Protein: 6.4 g/dL (ref 6.0–8.5)
eGFR: 109 mL/min/{1.73_m2} (ref >59)

## 2022-06-24 LAB — CBC WITH DIFFERENTIAL/PLATELET
Basophils Absolute: 0 10*3/uL (ref 0.0–0.2)
Basos: 0 %
EOS (ABSOLUTE): 0.1 10*3/uL (ref 0.0–0.4)
Eos: 2 %
Hematocrit: 40 % (ref 34.0–46.6)
Hemoglobin: 13 g/dL (ref 11.1–15.9)
Immature Grans (Abs): 0 10*3/uL (ref 0.0–0.1)
Immature Granulocytes: 0 %
Lymphocytes Absolute: 2.3 10*3/uL (ref 0.7–3.1)
Lymphs: 37 %
MCH: 30.2 pg (ref 26.6–33.0)
MCHC: 32.5 g/dL (ref 31.5–35.7)
MCV: 93 fL (ref 79–97)
Monocytes Absolute: 0.5 10*3/uL (ref 0.1–0.9)
Monocytes: 8 %
Neutrophils Absolute: 3.3 10*3/uL (ref 1.4–7.0)
Neutrophils: 53 %
Platelets: 211 10*3/uL (ref 150–450)
RBC: 4.31 x10E6/uL (ref 3.77–5.28)
RDW: 12.2 % (ref 11.7–15.4)
WBC: 6.3 10*3/uL (ref 3.4–10.8)

## 2022-06-24 LAB — VITAMIN D 25 HYDROXY (VIT D DEFICIENCY, FRACTURES): Vit D, 25-Hydroxy: 24.4 ng/mL — ABNORMAL LOW (ref 30.0–100.0)

## 2022-06-24 LAB — HEMOGLOBIN A1C
Est. average glucose Bld gHb Est-mCnc: 108 mg/dL
Hgb A1c MFr Bld: 5.4 % (ref 4.8–5.6)

## 2022-06-24 LAB — FOLATE: Folate: 8.5 ng/mL (ref >3.0)

## 2022-06-24 LAB — VITAMIN B12: Vitamin B-12: 380 pg/mL (ref 232–1245)

## 2022-06-24 LAB — TSH: TSH: 1.9 u[IU]/mL (ref 0.450–4.500)

## 2022-06-24 LAB — LIPID PANEL WITH LDL/HDL RATIO
Cholesterol, Total: 146 mg/dL (ref 100–199)
HDL: 37 mg/dL — ABNORMAL LOW (ref >39)
LDL Chol Calc (NIH): 87 mg/dL (ref 0–99)
LDL/HDL Ratio: 2.4 ratio (ref 0.0–3.2)
Triglycerides: 119 mg/dL (ref 0–149)
VLDL Cholesterol Cal: 22 mg/dL (ref 5–40)

## 2022-06-24 LAB — INSULIN, RANDOM: INSULIN: 24.4 u[IU]/mL (ref 2.6–24.9)

## 2022-06-24 LAB — T4, FREE: Free T4: 1.07 ng/dL (ref 0.82–1.77)

## 2022-06-28 ENCOUNTER — Other Ambulatory Visit: Payer: Self-pay | Admitting: Nurse Practitioner

## 2022-06-29 NOTE — Telephone Encounter (Signed)
Dosage increased to 50 mg per provider, will refuse this request.  Requested Prescriptions  Pending Prescriptions Disp Refills   sertraline (ZOLOFT) 25 MG tablet [Pharmacy Med Name: SERTRALINE HCL 25 MG TABLET] 100 tablet 0    Sig: TAKE 1 TABLET BY MOUTH DAILY FOR 2 WEEKS. THEN INCREASE TO 2 TABLETS DAILY AFTER THAT.     Psychiatry:  Antidepressants - SSRI - sertraline Failed - 06/28/2022  8:36 AM      Failed - ALT in normal range and within 360 days    ALT  Date Value Ref Range Status  06/23/2022 42 (H) 0 - 32 IU/L Final         Passed - AST in normal range and within 360 days    AST  Date Value Ref Range Status  06/23/2022 37 0 - 40 IU/L Final         Passed - Completed PHQ-2 or PHQ-9 in the last 360 days      Passed - Valid encounter within last 6 months    Recent Outpatient Visits           1 week ago Depression, major, single episode, severe (HCC)   [place] [facility] Larae Grooms, NP   1 month ago Annual physical exam   [facility] Larae Grooms, NP   2 months ago Environmental and seasonal allergies   [facility] Mecum, Oswaldo Conroy, PA-C   1 year ago Upper respiratory tract infection, unspecified type   [place] [facility] Vigg, Avanti, MD   1 year ago Depression, major, single episode, severe [facility])   [place] [facility] Marjie Skiff, NP       Future Appointments             In 5 months Larae Grooms, NP [place] [facility]

## 2022-07-07 ENCOUNTER — Ambulatory Visit (INDEPENDENT_AMBULATORY_CARE_PROVIDER_SITE_OTHER): Payer: Self-pay | Admitting: Family Medicine

## 2022-07-30 ENCOUNTER — Ambulatory Visit (INDEPENDENT_AMBULATORY_CARE_PROVIDER_SITE_OTHER): Payer: 59 | Admitting: Family Medicine

## 2022-08-04 ENCOUNTER — Encounter (INDEPENDENT_AMBULATORY_CARE_PROVIDER_SITE_OTHER): Payer: Self-pay

## 2022-08-04 ENCOUNTER — Ambulatory Visit (INDEPENDENT_AMBULATORY_CARE_PROVIDER_SITE_OTHER): Payer: Self-pay | Admitting: Family Medicine

## 2022-08-04 NOTE — Progress Notes (Signed)
No show

## 2022-08-17 ENCOUNTER — Encounter (INDEPENDENT_AMBULATORY_CARE_PROVIDER_SITE_OTHER): Payer: Self-pay | Admitting: Family Medicine

## 2022-08-18 ENCOUNTER — Ambulatory Visit (INDEPENDENT_AMBULATORY_CARE_PROVIDER_SITE_OTHER): Payer: 59 | Admitting: Family Medicine

## 2022-09-01 ENCOUNTER — Emergency Department
Admission: EM | Admit: 2022-09-01 | Discharge: 2022-09-01 | Disposition: A | Discharge status: Home / Self Care | Payer: 59 | Attending: Emergency Medicine | Admitting: Emergency Medicine

## 2022-09-01 ENCOUNTER — Ambulatory Visit: Payer: Self-pay

## 2022-09-01 ENCOUNTER — Other Ambulatory Visit: Payer: Self-pay

## 2022-09-01 DIAGNOSIS — I10 Essential (primary) hypertension: Secondary | ICD-10-CM | POA: Diagnosis not present

## 2022-09-01 DIAGNOSIS — R519 Headache, unspecified: Secondary | ICD-10-CM | POA: Diagnosis present

## 2022-09-01 LAB — URINALYSIS, ROUTINE W REFLEX MICROSCOPIC
Bilirubin Urine: NEGATIVE
Glucose, UA: NEGATIVE mg/dL
Hgb urine dipstick: NEGATIVE
Ketones, ur: 20 mg/dL — AB
Nitrite: NEGATIVE
Protein, ur: 30 mg/dL — AB
Specific Gravity, Urine: 1.033 — ABNORMAL HIGH (ref 1.005–1.030)
pH: 5 (ref 5.0–8.0)

## 2022-09-01 LAB — CBC
HCT: 44.2 % (ref 36.0–46.0)
Hemoglobin: 14.2 g/dL (ref 12.0–15.0)
MCH: 30.9 pg (ref 26.0–34.0)
MCHC: 32.1 g/dL (ref 30.0–36.0)
MCV: 96.1 fL (ref 80.0–100.0)
Platelets: 245 10*3/uL (ref 150–400)
RBC: 4.6 MIL/uL (ref 3.87–5.11)
RDW: 11.7 % (ref 11.5–15.5)
WBC: 10.8 10*3/uL — ABNORMAL HIGH (ref 4.0–10.5)
nRBC: 0 % (ref 0.0–0.2)

## 2022-09-01 LAB — BASIC METABOLIC PANEL
Anion gap: 10 (ref 5–15)
BUN: 13 mg/dL (ref 6–20)
CO2: 19 mmol/L — ABNORMAL LOW (ref 22–32)
Calcium: 9.1 mg/dL (ref 8.9–10.3)
Chloride: 104 mmol/L (ref 98–111)
Creatinine, Ser: 0.85 mg/dL (ref 0.44–1.00)
GFR, Estimated: >60 mL/min (ref >60)
Glucose, Bld: 97 mg/dL (ref 70–99)
Potassium: 4.5 mmol/L (ref 3.5–5.1)
Sodium: 133 mmol/L — ABNORMAL LOW (ref 135–145)

## 2022-09-01 MED ORDER — NIFEDIPINE ER OSMOTIC RELEASE 30 MG PO TB24
30.0000 mg | ORAL_TABLET | Freq: Every day | ORAL | 11 refills | Status: DC
Start: 2022-09-01 — End: 2023-01-27

## 2022-09-01 NOTE — ED Notes (Signed)
Pt verbalizes understanding of discharge instructions. Opportunity for questioning and answers were provided. Pt discharged from ED to home with family.    

## 2022-09-01 NOTE — Telephone Encounter (Addendum)
      Chief Complaint: BP 172/110 yesterday. Today 174/110. Taken by a nurse. Has "a bad headache."  Symptoms: Above Frequency: Yesterday Pertinent Negatives: Patient denies chest pain. Disposition: [x] ED /[] Urgent Care (no appt availability in office) / [] Appointment(In office/virtual)/ []  [facility]/ [] Home Care/ [] Refused Recommended Disposition /[] [facility]/ []  Follow-up with PCP Additional Notes: Instructed to have someone drive her now to ED. Verbalizes understanding. Spoke to [place] in the practice as well. Reason for Disposition  [1] Systolic BP  >= 160 OR Diastolic >= 100 AND [2] cardiac (e.g., breathing difficulty, chest pain) or neurologic symptoms (e.g., new-onset blurred or double vision, unsteady gait)  Answer Assessment - Initial Assessment Questions 1. BLOOD PRESSURE: "What is the blood pressure?" "Did you take at least two measurements 5 minutes apart?"     174/110 2. ONSET: "When did you take your blood pressure?"     Yesterday 3. HOW: "How did you take your blood pressure?" (e.g., automatic home BP monitor, visiting nurse)     Nurse 4. HISTORY: "Do you have a history of high blood pressure?"     No 5. MEDICINES: "Are you taking any medicines for blood pressure?" "Have you missed any doses recently?"     No 6. OTHER SYMPTOMS: "Do you have any symptoms?" (e.g., blurred vision, chest pain, difficulty breathing, headache, weakness)     Headache 7. PREGNANCY: "Is there any chance you are pregnant?" "When was your last menstrual period?"     No  Protocols used: Blood Pressure - High-A-AH

## 2022-09-01 NOTE — ED Triage Notes (Signed)
Pt presents to ED with c/o of headache due to hypertension. Pt states preeclampsia in 2021 with her son. Pt states HA since Sunday.

## 2022-09-01 NOTE — ED Provider Notes (Signed)
[facility] Provider Note   Event Date/Time   First MD Initiated Contact with Patient 09/01/22 1933     (approximate) History  Headache  HPI Claire Walker is a 27 y.o. female with stated past medical history of gestational hypertension and morbid obesity who presents complaining of headache with hypertension.  Patient states that her systolics have been up in the 170s recently and worsens her headache when her blood pressure is that high.  Patient does endorse some "blurred vision" that resolves when her blood pressure does.  Patient also endorses 8/10, nonradiating generalized headache. ROS: Patient currently denies any vision changes, tinnitus, difficulty speaking, facial droop, sore throat, chest pain, shortness of breath, abdominal pain, nausea/vomiting/diarrhea, dysuria, or weakness/numbness/paresthesias in any extremity   Physical Exam  Triage Vital Signs: ED Triage Vitals  Encounter Vitals Group     BP 09/01/22 1815 (!) 147/83     Systolic BP Percentile --      Diastolic BP Percentile --      Pulse Rate 09/01/22 1815 77     Resp 09/01/22 1815 18     Temp 09/01/22 1815 98.1 F (36.7 C)     Temp Source 09/01/22 1815 Oral     SpO2 09/01/22 1815 100 %     Weight --      Height --      Head Circumference --      Peak Flow --      Pain Score 09/01/22 1816 7     Pain Loc --      Pain Education --      Exclude from Growth Chart --    Most recent vital signs: Vitals:   09/01/22 1815 09/01/22 2032  BP: (!) 147/83 128/83  Pulse: 77 77  Resp: 18 18  Temp: 98.1 F (36.7 C)   SpO2: 100% 100%   General: Awake, oriented x4. CV:  Good peripheral perfusion.  Resp:  Normal effort.  Abd:  No distention.  Other:  Young adult morbidly obese Caucasian female resting comfortably in no acute distress ED Results / Procedures / Treatments  Labs (all labs ordered are listed, but only abnormal results are displayed) Labs Reviewed  CBC - Abnormal; Notable for the  following components:      Result Value   WBC 10.8 (*)    All other components within normal limits  BASIC METABOLIC PANEL - Abnormal; Notable for the following components:   Sodium 133 (*)    CO2 19 (*)    All other components within normal limits  URINALYSIS, ROUTINE W REFLEX MICROSCOPIC - Abnormal; Notable for the following components:   Color, Urine YELLOW (*)    APPearance CLOUDY (*)    Specific Gravity, Urine 1.033 (*)    Ketones, ur 20 (*)    Protein, ur 30 (*)    Leukocytes,Ua MODERATE (*)    Bacteria, UA RARE (*)    All other components within normal limits   EKG ED ECG REPORT I, Merwyn Katos, the attending physician, personally viewed and interpreted this ECG. Date: 09/01/2022 EKG Time: 1813 Rate: 79 Rhythm: normal sinus rhythm QRS Axis: normal Intervals: normal ST/T Wave abnormalities: normal Narrative Interpretation: no evidence of acute ischemia PROCEDURES: Critical Care performed: No Procedures MEDICATIONS ORDERED IN ED: Medications - No data to display IMPRESSION / MDM / ASSESSMENT AND PLAN / ED COURSE  I reviewed the triage vital signs and the nursing notes.  The patient is on the cardiac monitor to evaluate for evidence of arrhythmia and/or significant heart rate changes. Patient's presentation is most consistent with acute presentation with potential threat to life or bodily function. Presents to the emergency department complaining of high blood pressure. Patient is otherwise asymptomatic without confusion, chest pain, hematuria, or SOB. DDx: CV, AMI, heart failure, renal infarction or failure or other end organ damage.  Disposition: Discussed with patient their elevated blood pressure and need for close outpatient management of their hypertension. Will provide a prescription for Nifedipine XL 30mg  PO daily and arrange for the patient to follow up in a primary care clinic   FINAL CLINICAL IMPRESSION(S) / ED DIAGNOSES    Final diagnoses:  Uncontrolled hypertension   Rx / DC Orders   ED Discharge Orders          Ordered    NIFEdipine (PROCARDIA-XL/NIFEDICAL-XL) 30 MG 24 hr tablet  Daily        09/01/22 2022           Note:  This document was prepared using Dragon voice recognition software and may include unintentional dictation errors.   Merwyn Katos, MD 09/01/22 [phone]

## 2022-09-02 ENCOUNTER — Telehealth: Payer: Self-pay

## 2022-09-02 NOTE — Transitions of Care (Post Inpatient/ED Visit) (Unsigned)
   09/02/2022  Name: Siedah Melka MRN: [id number] DOB: February 07, 1995  Today's TOC FU Call Status: Today's TOC FU Call Status:: Unsuccessful Call (1st Attempt) Unsuccessful Call (1st Attempt) Date: 09/02/22  Attempted to reach the patient regarding the most recent Inpatient/ED visit.  Follow Up Plan: Additional outreach attempts will be made to reach the patient to complete the Transitions of Care (Post Inpatient/ED visit) call.   Signature Malen Gauze, [place]

## 2022-09-02 NOTE — Telephone Encounter (Signed)
-----   Message from [facility] R sent at 09/02/2022 12:04 PM EDT ----- Patient needs TOC call completed please.

## 2022-09-02 NOTE — Telephone Encounter (Signed)
Called patient to schedule appoint ment no answer left message on machine.Put in CRM for pec

## 2022-09-04 ENCOUNTER — Ambulatory Visit: Payer: 59 | Admitting: Physician Assistant

## 2022-09-04 ENCOUNTER — Encounter: Payer: Self-pay | Admitting: Physician Assistant

## 2022-09-04 ENCOUNTER — Telehealth: Payer: Self-pay

## 2022-09-04 VITALS — BP 135/79 | HR 121 | Temp 98.1°F | Ht 72.0 in | Wt 371.8 lb

## 2022-09-04 DIAGNOSIS — U071 COVID-19: Secondary | ICD-10-CM | POA: Diagnosis not present

## 2022-09-04 DIAGNOSIS — I10 Essential (primary) hypertension: Secondary | ICD-10-CM

## 2022-09-04 MED ORDER — NIRMATRELVIR/RITONAVIR (PAXLOVID)TABLET
3.0000 | ORAL_TABLET | Freq: Two times a day (BID) | ORAL | 0 refills | Status: AC
Start: 2022-09-04 — End: 2022-09-09

## 2022-09-04 NOTE — Transitions of Care (Post Inpatient/ED Visit) (Signed)
   09/04/2022  Name: Claire Walker MRN: [id number] DOB: 1995-06-12  Today's TOC FU Call Status:    Attempted to reach the patient regarding the most recent Inpatient/ED visit.  Follow Up Plan: No further outreach attempts will be made at this time. We have been unable to contact the patient.  Patient is scheduled for an appointment.  Signature Malen Gauze, [place]

## 2022-09-04 NOTE — Progress Notes (Signed)
Acute Office Visit   Patient: Claire Walker   DOB: December 09, 1995   27 y.o. Female  MRN: [id number] Visit Date: 09/04/2022  Today's healthcare provider: Oswaldo Conroy Izola Teague, PA-C  Introduced myself to the patient as a Secondary school teacher and provided education on APPs in clinical practice.    Chief Complaint  Patient presents with   Hypertension   Covid Positive    Patient mentioned she is COVID positive at her visit with provider today. Patient says she thinks her BP was elevated due to the COVID, but patient says she did not have any symptoms until after she tested positive for COVID. Patient denies being swabbed at the ER. Patient says she has been taking Ibuprofen and Tylenol and DayQuil/NyQuil.    Tachycardia   Headache   Cough   Subjective    HPI HPI     Covid Positive    Additional comments: Patient mentioned she is COVID positive at her visit with provider today. Patient says she thinks her BP was elevated due to the COVID, but patient says she did not have any symptoms until after she tested positive for COVID. Patient denies being swabbed at the ER. Patient says she has been taking Ibuprofen and Tylenol and DayQuil/NyQuil.       Last edited by Malen Gauze, CMA on 09/04/2022  3:03 PM.       Hypertensive emergency  Seen in ED on 09/01/22  She was seen in ED for elevated BP She has been taking the BP meds she was given but has had elevated readings and tachycardia at home She tested positive for COVID last night- symptoms started last night She has been taking Tylenol and Ibuprofen for COVID and has been using Dayquil/Nyquil    When she stands her head hurts  She is concerned for her HR as it has been elevated too     Medications: Outpatient Medications Prior to Visit  Medication Sig   albuterol (VENTOLIN HFA) 108 (90 Base) MCG/ACT inhaler Inhale 2 puffs into the lungs every 6 (six) hours as needed for wheezing or shortness of breath.   NIFEdipine  (PROCARDIA-XL/NIFEDICAL-XL) 30 MG 24 hr tablet Take 1 tablet (30 mg total) by mouth daily.   norethindrone-ethinyl estradiol-FE (BLISOVI FE 1/20) 1-20 MG-MCG tablet Take 1 tablet by mouth daily.   sertraline (ZOLOFT) 50 MG tablet Take 1 tablet (50 mg total) by mouth daily. Take 1 tablet by mouth daily   No facility-administered medications prior to visit.    Review of Systems  Constitutional:  Positive for chills.  Neurological:  Positive for headaches. Negative for light-headedness.        Objective    BP 135/79   Pulse (!) 121   Temp 98.1 F (36.7 C) (Oral)   Ht 6' (1.829 m)   Wt (!) 371 lb 12.8 oz (168.6 kg)   SpO2 98%   BMI 50.43 kg/m     Physical Exam Vitals reviewed.  Constitutional:      General: She is awake.     Appearance: Normal appearance. She is well-developed and well-groomed.  HENT:     Head: Normocephalic and atraumatic.  Cardiovascular:     Rate and Rhythm: Normal rate and regular rhythm.     Pulses:          Radial pulses are 2+ on the right side and 2+ on the left side.     Heart sounds: Normal heart sounds. No murmur heard.  No friction rub. No gallop.  Pulmonary:     Effort: Pulmonary effort is normal.     Breath sounds: Normal breath sounds. No decreased air movement. No decreased breath sounds, wheezing, rhonchi or rales.  Musculoskeletal:     Cervical back: Normal range of motion.     Right lower leg: No edema.     Left lower leg: No edema.  Neurological:     Mental Status: She is alert.  Psychiatric:        Behavior: Behavior is cooperative.       No results found for any visits on 09/04/22.  Assessment & Plan      Return in about 2 weeks (around 09/18/2022) for HTN.       Problem List Items Addressed This Visit       Cardiovascular and Mediastinum   Hypertension - Primary    Likely chronic Patient was seen in ED on 09/01/22 for uncontrolled hypertension and started on Nifedipine 30 mg PO every day BP appears to be  improving but she has also contracted COVID so she has been having some elevations at home and tachycardia Recommend she continues to take Nifedipine, stay well hydrated and checks BP daily at home  Will reassess HTN in about 2 weeks when she is hopefully recovered from COVID  Reviewed ED and return precautions Follow up in 2 weeks or sooner if concerns arise        Other Visit Diagnoses     COVID-19     Acute, new concern Pt symptoms started last night and she had positive test last PM  Will send in paxlovid for management Recommend she continues to use OTC medications for symptomatic management Reviewed ED and return precautions Follow up as needed for persistent or progressing symptoms     Relevant Medications   nirmatrelvir/ritonavir (PAXLOVID) 20 x 150 MG & 10 x 100MG  TABS        Return in about 2 weeks (around 09/18/2022) for HTN.   I, Jahdai Padovano E Patrisha Hausmann, PA-C, have reviewed all documentation for this visit. The documentation on 09/04/22 for the exam, diagnosis, procedures, and orders are all accurate and complete.   Jacquelin Hawking, MHS, PA-C [facility]

## 2022-09-04 NOTE — Telephone Encounter (Signed)
-----   Message from [facility] R sent at 09/02/2022 12:04 PM EDT ----- Patient needs TOC call completed please.

## 2022-09-04 NOTE — Assessment & Plan Note (Signed)
Likely chronic Patient was seen in ED on 09/01/22 for uncontrolled hypertension and started on Nifedipine 30 mg PO every day BP appears to be improving but she has also contracted COVID so she has been having some elevations at home and tachycardia Recommend she continues to take Nifedipine, stay well hydrated and checks BP daily at home  Will reassess HTN in about 2 weeks when she is hopefully recovered from COVID  Reviewed ED and return precautions Follow up in 2 weeks or sooner if concerns arise

## 2022-09-18 ENCOUNTER — Ambulatory Visit: Payer: 59 | Admitting: Physician Assistant

## 2022-09-28 ENCOUNTER — Other Ambulatory Visit: Payer: Self-pay | Admitting: Nurse Practitioner

## 2022-09-29 NOTE — Telephone Encounter (Signed)
Requested Prescriptions  Refused Prescriptions Disp Refills   sertraline (ZOLOFT) 25 MG tablet [Pharmacy Med Name: SERTRALINE HCL 25 MG TABLET] 100 tablet 0    Sig: TAKE 1 TABLET BY MOUTH DAILY FOR 2 WEEKS. THEN INCREASE TO 2 TABLETS DAILY AFTER THAT.     Psychiatry:  Antidepressants - SSRI - sertraline Failed - 09/28/2022  2:46 PM      Failed - ALT in normal range and within 360 days    ALT  Date Value Ref Range Status  06/23/2022 42 (H) 0 - 32 IU/L Final         Passed - AST in normal range and within 360 days    AST  Date Value Ref Range Status  06/23/2022 37 0 - 40 IU/L Final         Passed - Completed PHQ-2 or PHQ-9 in the last 360 days      Passed - Valid encounter within last 6 months    Recent Outpatient Visits           3 weeks ago Hypertension, unspecified type   [place] [facility] Mecum, Erin E, PA-C   3 months ago Depression, major, single episode, severe (HCC)   [facility] Larae Grooms, NP   4 months ago Annual physical exam   [place] [facility] Larae Grooms, NP   5 months ago Environmental and seasonal allergies   [place] [facility] Mecum, Oswaldo Conroy, PA-C   1 year ago Upper respiratory tract infection, unspecified type   [place] [facility] Loura Pardon, MD       Future Appointments             In 2 months Larae Grooms, NP [place] [facility]

## 2022-10-14 ENCOUNTER — Other Ambulatory Visit: Payer: Self-pay | Admitting: Nurse Practitioner

## 2022-10-15 NOTE — Telephone Encounter (Signed)
Request is too soon for refill, last refill 06/16/22 for 90 and 1 refill.  Requested Prescriptions  Pending Prescriptions Disp Refills   sertraline (ZOLOFT) 25 MG tablet [Pharmacy Med Name: SERTRALINE HCL 25 MG TABLET] 100 tablet 0    Sig: TAKE 1 TABLET BY MOUTH DAILY FOR 2 WEEKS. THEN INCREASE TO 2 TABLETS DAILY AFTER THAT.     Psychiatry:  Antidepressants - SSRI - sertraline Failed - 10/14/2022  5:38 PM      Failed - ALT in normal range and within 360 days    ALT  Date Value Ref Range Status  06/23/2022 42 (H) 0 - 32 IU/L Final         Passed - AST in normal range and within 360 days    AST  Date Value Ref Range Status  06/23/2022 37 0 - 40 IU/L Final         Passed - Completed PHQ-2 or PHQ-9 in the last 360 days      Passed - Valid encounter within last 6 months    Recent Outpatient Visits           1 month ago Hypertension, unspecified type   [place] [facility] Mecum, Erin E, PA-C   4 months ago Depression, major, single episode, severe (HCC)   [place] [facility] Larae Grooms, NP   5 months ago Annual physical exam   [place] [facility] Larae Grooms, NP   5 months ago Environmental and seasonal allergies   [place] [facility] Mecum, Oswaldo Conroy, PA-C   1 year ago Upper respiratory tract infection, unspecified type   [place] [facility] Loura Pardon, MD       Future Appointments             In 2 months Larae Grooms, NP [place] [facility], [facility]

## 2022-11-09 ENCOUNTER — Other Ambulatory Visit: Payer: Self-pay | Admitting: Nurse Practitioner

## 2022-11-09 NOTE — Telephone Encounter (Unsigned)
Copied from CRM [id number]. Topic: General - Other >> Nov 09, 2022  1:31 PM Everette C wrote: Reason for CRM: Medication Refill - Medication: sertraline (ZOLOFT) 50 MG tablet [[id number]]  NIFEdipine (PROCARDIA-XL/NIFEDICAL-XL) 30 MG 24 hr tablet [[id number]]   Has the patient contacted their pharmacy? Yes.   (Agent: If no, request that the patient contact the pharmacy for the refill. If patient does not wish to contact the pharmacy document the reason why and proceed with request.) (Agent: If yes, when and what did the pharmacy advise?)  Preferred Pharmacy (with phone number or street name): [id number] [place],  - [address] [place] [address] Phone: [phone] Fax: [phone] Hours: Not open 24 hours   Has the patient been seen for an appointment in the last year OR does the patient have an upcoming appointment? Yes.    Agent: Please be advised that RX refills may take up to 3 business days. We ask that you follow-up with your pharmacy.

## 2022-11-10 MED ORDER — SERTRALINE HCL 50 MG PO TABS: 50.0000 mg | ORAL_TABLET | Freq: Every day | ORAL | 0 refills | Status: DC

## 2022-11-10 NOTE — Telephone Encounter (Signed)
Requested Prescriptions  Pending Prescriptions Disp Refills   sertraline (ZOLOFT) 50 MG tablet 90 tablet 0    Sig: Take 1 tablet (50 mg total) by mouth daily. Take 1 tablet by mouth daily     Psychiatry:  Antidepressants - SSRI - sertraline Failed - 11/09/2022  2:00 PM      Failed - ALT in normal range and within 360 days    ALT  Date Value Ref Range Status  06/23/2022 42 (H) 0 - 32 IU/L Final         Passed - AST in normal range and within 360 days    AST  Date Value Ref Range Status  06/23/2022 37 0 - 40 IU/L Final         Passed - Completed PHQ-2 or PHQ-9 in the last 360 days      Passed - Valid encounter within last 6 months    Recent Outpatient Visits           2 months ago Hypertension, unspecified type   [place] [facility] Mecum, Erin E, PA-C   4 months ago Depression, major, single episode, severe (HCC)   [place] [facility] Larae Grooms, NP   6 months ago Annual physical exam   [place] [facility] Larae Grooms, NP   6 months ago Environmental and seasonal allergies   [place] Mecum, Oswaldo Conroy, PA-C   1 year ago Upper respiratory tract infection, unspecified type   [place] [facility] Vigg, Avanti, MD       Future Appointments             In 1 month Larae Grooms, NP [place] [facility]             NIFEdipine (PROCARDIA-XL/NIFEDICAL-XL) 30 MG 24 hr tablet 30 tablet 11    Sig: Take 1 tablet (30 mg total) by mouth daily.     Cardiovascular: Calcium Channel Blockers 2 Failed - 11/09/2022  2:00 PM      Failed - Last Heart Rate in normal range    Pulse Readings from Last 1 Encounters:  09/04/22 (!) 121         Passed - Last BP in normal range    BP Readings from Last 1 Encounters:  09/04/22 135/79         Passed - Valid encounter within last 6 months    Recent Outpatient Visits           2 months ago Hypertension,  unspecified type   [place] [facility] Mecum, Erin E, PA-C   4 months ago Depression, major, single episode, severe (HCC)   [place] [facility] Larae Grooms, NP   6 months ago Annual physical exam   [place] [facility] Larae Grooms, NP   6 months ago Environmental and seasonal allergies   [place] [facility] Mecum, Oswaldo Conroy, PA-C   1 year ago Upper respiratory tract infection, unspecified type   [place] [facility] Loura Pardon, MD       Future Appointments             In 1 month Larae Grooms, NP [place] [facility], [facility]

## 2022-11-10 NOTE — Telephone Encounter (Signed)
Requested medications are due for refill today.  no  Requested medications are on the active medications list.  yes  Last refill. 09/01/2022 #30 11rf  Future visit scheduled.   yes  Notes to clinic.  Refill not due. Rx signed by Donna Bernard.    Requested Prescriptions  Pending Prescriptions Disp Refills   NIFEdipine (PROCARDIA-XL/NIFEDICAL-XL) 30 MG 24 hr tablet 30 tablet 11    Sig: Take 1 tablet (30 mg total) by mouth daily.     Cardiovascular: Calcium Channel Blockers 2 Failed - 11/09/2022  2:00 PM      Failed - Last Heart Rate in normal range    Pulse Readings from Last 1 Encounters:  09/04/22 (!) 121         Passed - Last BP in normal range    BP Readings from Last 1 Encounters:  09/04/22 135/79         Passed - Valid encounter within last 6 months    Recent Outpatient Visits           2 months ago Hypertension, unspecified type   [place] [facility] Mecum, Erin E, PA-C   4 months ago Depression, major, single episode, severe (HCC)   [place] [facility] Larae Grooms, NP   6 months ago Annual physical exam   [place] [facility] Larae Grooms, NP   6 months ago Environmental and seasonal allergies   [place] [facility] Mecum, Oswaldo Conroy, PA-C   1 year ago Upper respiratory tract infection, unspecified type   [place] [facility] Vigg, Avanti, MD       Future Appointments             In 1 month Larae Grooms, NP [place] [facility]            Signed Prescriptions Disp Refills   sertraline (ZOLOFT) 50 MG tablet 90 tablet 0    Sig: Take 1 tablet (50 mg total) by mouth daily. Take 1 tablet by mouth daily     Psychiatry:  Antidepressants - SSRI - sertraline Failed - 11/09/2022  2:00 PM      Failed - ALT in normal range and within 360 days    ALT  Date Value Ref Range Status  06/23/2022 42 (H) 0 - 32 IU/L Final         Passed - AST in  normal range and within 360 days    AST  Date Value Ref Range Status  06/23/2022 37 0 - 40 IU/L Final         Passed - Completed PHQ-2 or PHQ-9 in the last 360 days      Passed - Valid encounter within last 6 months    Recent Outpatient Visits           2 months ago Hypertension, unspecified type   [place] [facility] Mecum, Erin E, PA-C   4 months ago Depression, major, single episode, severe (HCC)   [place] [facility] Larae Grooms, NP   6 months ago Annual physical exam   [place] [facility] Larae Grooms, NP   6 months ago Environmental and seasonal allergies   [place] [facility] Mecum, Oswaldo Conroy, PA-C   1 year ago Upper respiratory tract infection, unspecified type   [place] [facility] Loura Pardon, MD       Future Appointments  In 1 month Larae Grooms, NP [place] [facility]

## 2022-12-11 ENCOUNTER — Other Ambulatory Visit: Payer: Self-pay | Admitting: Nurse Practitioner

## 2022-12-11 DIAGNOSIS — J069 Acute upper respiratory infection, unspecified: Secondary | ICD-10-CM

## 2022-12-11 NOTE — Telephone Encounter (Signed)
Pt was on phone with agent. She was hopeful that we could refill today as she is out.  Rx was refill 11/10/2022 #90 . Called pt back - no answer voice mail not set up. Called pharmacy. Rx was only filled for 16 tablets to get her meds to synchronize.  Pharmacy will prepare remaining refill .

## 2022-12-11 NOTE — Telephone Encounter (Signed)
Requested Prescriptions  Refused Prescriptions Disp Refills   sertraline (ZOLOFT) 50 MG tablet 90 tablet 0    Sig: Take 1 tablet (50 mg total) by mouth daily. Take 1 tablet by mouth daily     Psychiatry:  Antidepressants - SSRI - sertraline Failed - 12/11/2022 12:58 PM      Failed - ALT in normal range and within 360 days    ALT  Date Value Ref Range Status  06/23/2022 42 (H) 0 - 32 IU/L Final         Passed - AST in normal range and within 360 days    AST  Date Value Ref Range Status  06/23/2022 37 0 - 40 IU/L Final         Passed - Completed PHQ-2 or PHQ-9 in the last 360 days      Passed - Valid encounter within last 6 months    Recent Outpatient Visits           3 months ago Hypertension, unspecified type   [place] [facility] Mecum, Erin E, PA-C   5 months ago Depression, major, single episode, severe (HCC)   [place] [facility] Larae Grooms, NP   7 months ago Annual physical exam   [place] [facility] Larae Grooms, NP   7 months ago Environmental and seasonal allergies   [place] [facility] Mecum, Oswaldo Conroy, PA-C   1 year ago Upper respiratory tract infection, unspecified type   [place] [facility] Loura Pardon, MD       Future Appointments             In 1 week Larae Grooms, NP [place] [facility], [facility]

## 2022-12-11 NOTE — Telephone Encounter (Signed)
Medication Refill -  Most Recent Primary Care Visit:  Provider: Jacquelin Hawking E  Department: [facility]  Visit Type: HOSPITAL FU  Date: 09/04/2022  Medication: sertraline (ZOLOFT) 50 MG tablet  *Patient states the needs wording to be changed & needs to be take medication as needed, completely out, patient states no anxiety right now but needs to say as needed because she needs this as needed.   Has the patient contacted their pharmacy?  Yes, advised to contact PCP  Is this the correct pharmacy for this prescription? Yes, pharmacy below is correct.  If no, delete pharmacy and type the correct one.  This is the patient's preferred pharmacy:  [phone]  [address] [place] [address] Phone: [phone] Fax: [phone]   Has the prescription been filled recently? Receipt confirmed by pharmacy (11/10/2022  2:53 PM EDT)  / START DATE 10.22.2024  Is the patient out of the medication? Yes, completely out.  Has the patient been seen for an appointment in the last year OR does the patient have an upcoming appointment? Patient has a f/u appt schedule for 12.2.2024

## 2022-12-14 NOTE — Telephone Encounter (Signed)
Requested Prescriptions  Pending Prescriptions Disp Refills   albuterol (VENTOLIN HFA) 108 (90 Base) MCG/ACT inhaler [Pharmacy Med Name: ALBUTEROL HFA (VENTOLIN) INH] 18 each 0    Sig: INHALE 2 PUFFS BY MOUTH EVERY 6 HOURS AS NEEDED FOR WHEEZE OR SHORTNESS OF BREATH     Pulmonology:  Beta Agonists 2 Failed - 12/11/2022  1:30 PM      Failed - Last Heart Rate in normal range    Pulse Readings from Last 1 Encounters:  09/04/22 (!) 121         Passed - Last BP in normal range    BP Readings from Last 1 Encounters:  09/04/22 135/79         Passed - Valid encounter within last 12 months    Recent Outpatient Visits           3 months ago Hypertension, unspecified type   [place] [facility] Mecum, Erin E, PA-C   6 months ago Depression, major, single episode, severe (HCC)   [place] [facility] Larae Grooms, NP   7 months ago Annual physical exam   [place] [facility] Larae Grooms, NP   7 months ago Environmental and seasonal allergies   [place] [facility] Mecum, Oswaldo Conroy, PA-C   1 year ago Upper respiratory tract infection, unspecified type   [place] [facility] Loura Pardon, MD       Future Appointments             In 1 week Larae Grooms, NP  [facility]

## 2022-12-21 ENCOUNTER — Ambulatory Visit: Payer: 59 | Admitting: Nurse Practitioner

## 2022-12-21 NOTE — Progress Notes (Unsigned)
There were no vitals taken for this visit.   Subjective:    Patient ID: Claire Walker, female    DOB: [birth year], 27 y.o.   MRN: [id number]  HPI: Claire Walker is a 27 y.o. female  No chief complaint on file.  DEPRESSION/ANXIETY Patient states she she is taking the Zoloft.  She states it is working great for her.  She is taking 50mg  and feels like it is a good dose for her.  She denies concerns at visit today.    HYPERTENSION {Blank single:19197::"without","with"} Chronic Kidney Disease Hypertension status: {Blank single:19197::"controlled","uncontrolled","better","worse","exacerbated","stable"}  Satisfied with current treatment? {Blank single:19197::"yes","no"} Duration of hypertension: {Blank single:19197::"chronic","months","years"} BP monitoring frequency:  {Blank single:19197::"not checking","rarely","daily","weekly","monthly","a few times a day","a few times a week","a few times a month"} BP range:  BP medication side effects:  {Blank single:19197::"yes","no"} Medication compliance: {Blank single:19197::"excellent compliance","good compliance","fair compliance","poor compliance"} Previous BP meds:{Blank multiple:19196::"none","amlodipine","amlodipine/benazepril","atenolol","benazepril","benazepril/HCTZ","bisoprolol (bystolic)","carvedilol","chlorthalidone","clonidine","diltiazem","exforge HCT","HCTZ","irbesartan (avapro)","labetalol","lisinopril","lisinopril-HCTZ","losartan (cozaar)","methyldopa","nifedipine","olmesartan (benicar)","olmesartan-HCTZ","quinapril","ramipril","spironalactone","tekturna","valsartan","valsartan-HCTZ","verapamil"} Aspirin: {Blank single:19197::"yes","no"} Recurrent headaches: {Blank single:19197::"yes","no"} Visual changes: {Blank single:19197::"yes","no"} Palpitations: {Blank single:19197::"yes","no"} Dyspnea: {Blank single:19197::"yes","no"} Chest pain: {Blank single:19197::"yes","no"} Lower extremity edema: {Blank  single:19197::"yes","no"} Dizzy/lightheaded: {Blank single:19197::"yes","no"}    Relevant past medical, surgical, family and social history reviewed and updated as indicated. Interim medical history since our last visit reviewed. Allergies and medications reviewed and updated.  Review of Systems  Psychiatric/Behavioral:  Positive for dysphoric mood. Negative for suicidal ideas. The patient is nervous/anxious.     Per HPI unless specifically indicated above     Objective:    There were no vitals taken for this visit.  Wt Readings from Last 3 Encounters:  09/04/22 (!) 371 lb 12.8 oz (168.6 kg)  06/23/22 (!) 385 lb (174.6 kg)  06/16/22 (!) 387 lb 9.6 oz (175.8 kg)    Physical Exam Vitals and nursing note reviewed.  Constitutional:      General: She is not in acute distress.    Appearance: Normal appearance. She is obese. She is not ill-appearing, toxic-appearing or diaphoretic.  HENT:     Head: Normocephalic.     Right Ear: External ear normal.     Left Ear: External ear normal.     Nose: Nose normal.     Mouth/Throat:     Mouth: Mucous membranes are moist.     Pharynx: Oropharynx is clear.  Eyes:     General:        Right eye: No discharge.        Left eye: No discharge.     Extraocular Movements: Extraocular movements intact.     Conjunctiva/sclera: Conjunctivae normal.     Pupils: Pupils are equal, round, and reactive to light.  Cardiovascular:     Rate and Rhythm: Normal rate and regular rhythm.     Heart sounds: No murmur heard. Pulmonary:     Effort: Pulmonary effort is normal. No respiratory distress.     Breath sounds: Normal breath sounds. No wheezing or rales.  Musculoskeletal:     Cervical back: Normal range of motion and neck supple.  Skin:    General: Skin is warm and dry.     Capillary Refill: Capillary refill takes less than 2 seconds.  Neurological:     General: No focal deficit present.     Mental Status: She is alert and oriented to person,  place, and time. Mental status is at baseline.  Psychiatric:        Mood and Affect: Mood normal.        Behavior: Behavior normal.  Thought Content: Thought content normal.        Judgment: Judgment normal.     Results for orders placed or performed during the hospital encounter of 09/01/22  CBC  Result Value Ref Range   WBC 10.8 (H) 4.0 - 10.5 K/uL   RBC 4.60 3.87 - 5.11 MIL/uL   Hemoglobin 14.2 12.0 - 15.0 g/dL   HCT 29.5 28.4 - 13.2 %   MCV 96.1 80.0 - 100.0 fL   MCH 30.9 26.0 - 34.0 pg   MCHC 32.1 30.0 - 36.0 g/dL   RDW 44.0 10.2 - 72.5 %   Platelets 245 150 - 400 K/uL   nRBC 0.0 0.0 - 0.2 %  Basic metabolic panel  Result Value Ref Range   Sodium 133 (L) 135 - 145 mmol/L   Potassium 4.5 3.5 - 5.1 mmol/L   Chloride 104 98 - 111 mmol/L   CO2 19 (L) 22 - 32 mmol/L   Glucose, Bld 97 70 - 99 mg/dL   BUN 13 6 - 20 mg/dL   Creatinine, Ser 3.66 0.44 - 1.00 mg/dL   Calcium 9.1 8.9 - 44.0 mg/dL   GFR, Estimated >34 >74 mL/min   Anion gap 10 5 - 15  Urinalysis, Routine w reflex microscopic -Urine, Clean Catch  Result Value Ref Range   Color, Urine YELLOW (A) YELLOW   APPearance CLOUDY (A) CLEAR   Specific Gravity, Urine 1.033 (H) 1.005 - 1.030   pH 5.0 5.0 - 8.0   Glucose, UA NEGATIVE NEGATIVE mg/dL   Hgb urine dipstick NEGATIVE NEGATIVE   Bilirubin Urine NEGATIVE NEGATIVE   Ketones, ur 20 (A) NEGATIVE mg/dL   Protein, ur 30 (A) NEGATIVE mg/dL   Nitrite NEGATIVE NEGATIVE   Leukocytes,Ua MODERATE (A) NEGATIVE   RBC / HPF 6-10 0 - 5 RBC/hpf   WBC, UA 21-50 0 - 5 WBC/hpf   Bacteria, UA RARE (A) NONE SEEN   Squamous Epithelial / HPF 6-10 0 - 5 /HPF   Mucus PRESENT       Assessment & Plan:   Problem List Items Addressed This Visit       Other   Depression, major, single episode, severe (HCC) - Primary   Anxiety     Follow up plan: No follow-ups on file.

## 2023-01-21 ENCOUNTER — Telehealth: Payer: 59 | Admitting: Family Medicine

## 2023-01-21 ENCOUNTER — Other Ambulatory Visit: Payer: Self-pay

## 2023-01-21 ENCOUNTER — Ambulatory Visit: Payer: Self-pay

## 2023-01-21 DIAGNOSIS — J208 Acute bronchitis due to other specified organisms: Secondary | ICD-10-CM | POA: Diagnosis not present

## 2023-01-21 MED ORDER — PROMETHAZINE-DM 6.25-15 MG/5ML PO SYRP
5.0000 mL | ORAL_SOLUTION | Freq: Four times a day (QID) | ORAL | 0 refills | Status: DC | PRN
Start: 2023-01-21 — End: 2023-04-27

## 2023-01-21 MED ORDER — ALBUTEROL SULFATE HFA 108 (90 BASE) MCG/ACT IN AERS
1.0000 | INHALATION_SPRAY | RESPIRATORY_TRACT | 0 refills | Status: AC | PRN
Start: 2023-01-21 — End: ?

## 2023-01-21 MED ORDER — QVAR REDIHALER 40 MCG/ACT IN AERB
1.0000 | INHALATION_SPRAY | Freq: Two times a day (BID) | RESPIRATORY_TRACT | 0 refills | Status: AC
Start: 2023-01-21 — End: ?

## 2023-01-21 NOTE — Telephone Encounter (Signed)
 FYI: Patient has virtual visit this afternoon at 4:30

## 2023-01-21 NOTE — Telephone Encounter (Signed)
  Chief Complaint: Nagging cough - clear sputum Symptoms: above Frequency: weeks Pertinent Negatives: Patient denies any other s/s Disposition: [] ED /[] Urgent Care (no appt availability in office) / [] Appointment(In office/virtual)/ [x]  [facility]/ [] Home Care/ [] Refused Recommended Disposition /[] [facility]/ []  Follow-up with PCP Additional Notes: Pt returned our call. She was sick and went to [facility]. Pt had abx and steroids prescribed. Pt is feeling much better except for a nagging near constant cough. No appts in office. Scheduled VV for this afternoon.  Pt is also asking that her sertraline  medication rx be re-written to say as needed Pt states that when she has a bad day she will take medication 2 times that day.   Pt is also requesting refill of HTN medication nifedipine .   Summary: nagging cough   Patient said she has a nagging cough with clear mucus and she can't get rid of the cough after weeks. Patient also request to have the instructions for sertraline  (ZOLOFT ) 50 MG tablet changed to as needed. Please f/u with patient     Reason for Disposition  [1] Continuous (nonstop) coughing interferes with work or school AND [2] no improvement using cough treatment per Care Advice  Answer Assessment - Initial Assessment Questions 1. ONSET: When did the cough begin?      Weeks 2. SEVERITY: How bad is the cough today?      Fairly constant 3. SPUTUM: Describe the color of your sputum (none, dry cough; clear, white, yellow, green)     clear 4. HEMOPTYSIS: Are you coughing up any blood? If so ask: How much? (flecks, streaks, tablespoons, etc.)     no 5. DIFFICULTY BREATHING: Are you having difficulty breathing? If Yes, ask: How bad is it? (e.g., mild, moderate, severe)    - MILD: No SOB at rest, mild SOB with walking, speaks normally in sentences, can lie down, no retractions, pulse < 100.    - MODERATE: SOB at rest, SOB with minimal  exertion and prefers to sit, cannot lie down flat, speaks in phrases, mild retractions, audible wheezing, pulse 100-120.    - SEVERE: Very SOB at rest, speaks in single words, struggling to breathe, sitting hunched forward, retractions, pulse > 120      no 6. FEVER: Do you have a fever? If Yes, ask: What is your temperature, how was it measured, and when did it start?     resolved  Protocols used: Cough - Acute Productive-A-AH

## 2023-01-21 NOTE — Patient Instructions (Addendum)
 Claire Walker, thank you for joining Claire CHRISTELLA Barefoot, NP for today's virtual visit.  While this provider is not your primary care provider (PCP), if your PCP is located in our provider database this encounter information will be shared with them immediately following your visit.   A [facility] MyChart account gives you access to today's visit and all your visits, tests, and labs performed at [facility]  click here if you don't have a [facility] MyChart account or go to [email or web address]  Consent: (Patient) Claire Walker provided verbal consent for this virtual visit at the beginning of the encounter.  Current Medications:  Current Outpatient Medications:    beclomethasone (QVAR  REDIHALER) 40 MCG/ACT inhaler, Inhale 1 puff into the lungs 2 (two) times daily., Disp: 1 each, Rfl: 0   promethazine -dextromethorphan (PROMETHAZINE -DM) 6.25-15 MG/5ML syrup, Take 5 mLs by mouth 4 (four) times daily as needed for cough., Disp: 118 mL, Rfl: 0   albuterol  (VENTOLIN  HFA) 108 (90 Base) MCG/ACT inhaler, Inhale 1-2 puffs into the lungs every 4 (four) hours as needed for wheezing or shortness of breath., Disp: 18 each, Rfl: 0   NIFEdipine  (PROCARDIA -XL/NIFEDICAL-XL) 30 MG 24 hr tablet, Take 1 tablet (30 mg total) by mouth daily., Disp: 30 tablet, Rfl: 11   norethindrone -ethinyl estradiol -FE (BLISOVI FE 1/20) 1-20 MG-MCG tablet, Take 1 tablet by mouth daily., Disp: 84 tablet, Rfl: 3   sertraline  (ZOLOFT ) 50 MG tablet, Take 1 tablet (50 mg total) by mouth daily. Take 1 tablet by mouth daily, Disp: 90 tablet, Rfl: 0   Medications ordered in this encounter:  Meds ordered this encounter  Medications   beclomethasone (QVAR  REDIHALER) 40 MCG/ACT inhaler    Sig: Inhale 1 puff into the lungs 2 (two) times daily.    Dispense:  1 each    Refill:  0    Supervising Provider:   LAMPTEY, PHILIP O [[id number]]   albuterol  (VENTOLIN  HFA) 108 (90 Base) MCG/ACT inhaler    Sig: Inhale 1-2 puffs into the lungs  every 4 (four) hours as needed for wheezing or shortness of breath.    Dispense:  18 each    Refill:  0    Supervising Provider:   BLAISE ALEENE KIDD [[id number]]   promethazine -dextromethorphan (PROMETHAZINE -DM) 6.25-15 MG/5ML syrup    Sig: Take 5 mLs by mouth 4 (four) times daily as needed for cough.    Dispense:  118 mL    Refill:  0    Supervising Provider:   BLAISE ALEENE KIDD [[id number]]     *If you need refills on other medications prior to your next appointment, please contact your pharmacy*  Follow-Up: Call back or seek an in-person evaluation if the symptoms worsen or if the condition fails to improve as anticipated.  [facility] [phone]  Other Instructions Acute Bronchitis, Adult  Acute bronchitis is when air tubes in the lungs (bronchi) suddenly get swollen. The condition can make it hard for you to breathe. In adults, acute bronchitis usually goes away within 2 weeks. A cough caused by bronchitis may last up to 3 weeks. Smoking, allergies, and asthma can make the condition worse. What are the causes? Germs that cause cold and flu (viruses). The most common cause of this condition is the virus that causes the common cold. Bacteria. Substances that bother (irritate) the lungs, including: Smoke from cigarettes and other types of tobacco. Dust and pollen. Fumes from chemicals, gases, or burned fuel. Indoor or outdoor air pollution. What increases the risk? A  weak body's defense system. This is also called the immune system. Any condition that affects your lungs and breathing, such as asthma. What are the signs or symptoms? A cough. Coughing up clear, yellow, or green mucus. Making high-pitched whistling sounds when you breathe, most often when you breathe out (wheezing). Runny or stuffy nose. Having too much mucus in your lungs (chest congestion). Shortness of breath. Body aches. A sore throat. How is this treated? Acute bronchitis may go away over  time without treatment. Your doctor may tell you to: Drink more fluids. This will help thin your mucus so it is easier to cough up. Use a device that gets medicine into your lungs (inhaler). Use a vaporizer or a humidifier. These are machines that add water to the air. This helps with coughing and poor breathing. Take a medicine that thins mucus and helps clear it from your lungs. Take a medicine that prevents or stops coughing. It is not common to take an antibiotic medicine for this condition. Follow these instructions at home:  Take over-the-counter and prescription medicines only as told by your doctor. Use an inhaler, vaporizer, or humidifier as told by your doctor. Take two teaspoons (10 mL) of honey at bedtime. This helps lessen your coughing at night. Drink enough fluid to keep your pee (urine) pale yellow. Do not smoke or use any products that contain nicotine or tobacco. If you need help quitting, ask your doctor. Get a lot of rest. Return to your normal activities when your doctor says that it is safe. Keep all follow-up visits. How is this prevented?  Wash your hands often with soap and water for at least 20 seconds. If you cannot use soap and water, use hand sanitizer. Avoid contact with people who have cold symptoms. Try not to touch your mouth, nose, or eyes with your hands. Avoid breathing in smoke or chemical fumes. Make sure to get the flu shot every year. Contact a doctor if: Your symptoms do not get better in 2 weeks. You have trouble coughing up the mucus. Your cough keeps you awake at night. You have a fever. Get help right away if: You cough up blood. You have chest pain. You have very bad shortness of breath. You faint or keep feeling like you are going to faint. You have a very bad headache. Your fever or chills get worse. These symptoms may be an emergency. Get help right away. Call your local emergency services (911 in the [place] Do not wait to see if  the symptoms will go away. Do not drive yourself to the hospital. Summary Acute bronchitis is when air tubes in the lungs (bronchi) suddenly get swollen. In adults, acute bronchitis usually goes away within 2 weeks. Drink more fluids. This will help thin your mucus so it is easier to cough up. Take over-the-counter and prescription medicines only as told by your doctor. Contact a doctor if your symptoms do not improve after 2 weeks of treatment. This information is not intended to replace advice given to you by your health care provider. Make sure you discuss any questions you have with your health care provider. Document Revised: 05/08/2020 Document Reviewed: 05/08/2020 Elsevier Patient Education  2024 Elsevier Inc.    If you have been instructed to have an in-person evaluation today at a local Urgent Care facility, please use the link below. It will take you to a list of all of our available  Urgent Cares, including address, phone number and hours of  operation. Please do not delay care.  [facility]  If you or a family member do not have a primary care provider, use the link below to schedule a visit and establish care. When you choose a [facility] primary care physician or advanced practice provider, you gain a long-term partner in health. Find a Primary Care Provider  Learn more about [facility]'s in-office and virtual care options: [facility] - Get Care Now

## 2023-01-21 NOTE — Telephone Encounter (Signed)
 Patient called, unable to leave VM to return the call to the office to speak to the NT due to VM not being set up.    Summary: nagging cough   Patient said she has a nagging cough with clear mucus and she can't get rid of the cough after weeks. Patient also request to have the instructions for sertraline  (ZOLOFT ) 50 MG tablet changed to as needed. Please f/u with patient

## 2023-01-21 NOTE — Progress Notes (Signed)
 Virtual Visit Consent   Claire Walker, you are scheduled for a virtual visit with a [facility] provider today. Just as with appointments in the office, your consent must be obtained to participate. Your consent will be active for this visit and any virtual visit you may have with one of our providers in the next 365 days. If you have a MyChart account, a copy of this consent can be sent to you electronically.  As this is a virtual visit, video technology does not allow for your provider to perform a traditional examination. This may limit your provider's ability to fully assess your condition. If your provider identifies any concerns that need to be evaluated in person or the need to arrange testing (such as labs, EKG, etc.), we will make arrangements to do so. Although advances in technology are sophisticated, we cannot ensure that it will always work on either your end or our end. If the connection with a video visit is poor, the visit may have to be switched to a telephone visit. With either a video or telephone visit, we are not always able to ensure that we have a secure connection.  By engaging in this virtual visit, you consent to the provision of healthcare and authorize for your insurance to be billed (if applicable) for the services provided during this visit. Depending on your insurance coverage, you may receive a charge related to this service.  I need to obtain your verbal consent now. Are you willing to proceed with your visit today? Claire Walker has provided verbal consent on 01/21/2023 for a virtual visit (video or telephone). Chiquita CHRISTELLA Barefoot, NP  Date: 01/21/2023 4:32 PM  Virtual Visit via Video Note   I, Chiquita CHRISTELLA Barefoot, connected with  Claire Walker  ([id number], [birth year]) on 01/21/23 at  4:30 PM EST by a video-enabled telemedicine application and verified that I am speaking with the correct person using two identifiers.  Location: Patient: Virtual Visit Location Patient: Home Provider:  Virtual Visit Location Provider: Home Office   I discussed the limitations of evaluation and management by telemedicine and the availability of in person appointments. The patient expressed understanding and agreed to proceed.    History of Present Illness: Claire Walker is a 28 y.o. who identifies as a female who was assigned female at birth, and is being seen today for cough  Onset was 12/10 seen on 12/15 had URI and cough was seen at clinic and started on z pack, inhaler and pred dose pack Was improved and then Christmas started with dry cough again, persistent and causing a headache  Associated symptoms are as stated above  Modifying factors are completed all the medications provided and using inhaler and reports it helps some Denies chest pain, shortness of breath, fevers, chills  Exposure to sick contacts- unknown  COVID test: no Vaccines: not up to date.   Problems:  Patient Active Problem List   Diagnosis Date Noted   Hypertension 09/04/2022   Edema of both lower extremities 06/23/2022   Upper respiratory tract infection 04/15/2021   Fever 04/15/2021   Uses birth control 01/16/2021   Otitis media 01/16/2021   Anxiety 05/21/2020   History of COVID-19 01/09/2020   Gestational hypertension 02/24/2019   Morbid obesity (HCC) 01/11/2019   Depression, major, single episode, severe (HCC) 04/07/2017   Environmental and seasonal allergies 10/11/2014    Allergies: No Known Allergies Medications:  Current Outpatient Medications:    albuterol  (VENTOLIN  HFA) 108 (90 Base) MCG/ACT inhaler, INHALE  2 PUFFS BY MOUTH EVERY 6 HOURS AS NEEDED FOR WHEEZE OR SHORTNESS OF BREATH, Disp: 18 each, Rfl: 0   NIFEdipine  (PROCARDIA -XL/NIFEDICAL-XL) 30 MG 24 hr tablet, Take 1 tablet (30 mg total) by mouth daily., Disp: 30 tablet, Rfl: 11   norethindrone -ethinyl estradiol -FE (BLISOVI FE 1/20) 1-20 MG-MCG tablet, Take 1 tablet by mouth daily., Disp: 84 tablet, Rfl: 3   sertraline  (ZOLOFT ) 50 MG tablet,  Take 1 tablet (50 mg total) by mouth daily. Take 1 tablet by mouth daily, Disp: 90 tablet, Rfl: 0  Observations/Objective: Patient is well-developed, well-nourished in no acute distress.  Resting comfortably at home.  Head is normocephalic, atraumatic.  No labored breathing. Speech is clear and coherent with logical content.  Patient is alert and oriented at baseline.  Persistent cough    Assessment and Plan:  1. Acute viral bronchitis  - beclomethasone (QVAR  REDIHALER) 40 MCG/ACT inhaler; Inhale 1 puff into the lungs 2 (two) times daily.  Dispense: 1 each; Refill: 0 - albuterol  (VENTOLIN  HFA) 108 (90 Base) MCG/ACT inhaler; Inhale 1-2 puffs into the lungs every 4 (four) hours as needed for wheezing or shortness of breath.  Dispense: 18 each; Refill: 0 - promethazine -dextromethorphan (PROMETHAZINE -DM) 6.25-15 MG/5ML syrup; Take 5 mLs by mouth 4 (four) times daily as needed for cough.  Dispense: 118 mL; Refill: 0   -will avoid repeat ABX and oral STEROIDs given recent use- opt for steroid inhaler and more frequent use of inhaler  - Take meds as prescribed - Rest voice - Use a cool mist humidifier especially during the winter months when heat dries out the air. - Use saline nose sprays frequently to help soothe nasal passages if they are drying out. - Stay hydrated by drinking plenty of fluids - Keep thermostat turn down low to prevent drying out which can cause a dry cough. - For fever or aches or pains- take tylenol  or ibuprofen  as directed on bottle             * for fevers greater than 101 orally you may alternate ibuprofen  and tylenol  every 3 hours.  If you do not improve you will need a follow up visit in person.        Reviewed side effects, risks and benefits of medication.    Patient acknowledged agreement and understanding of the plan.   Past Medical, Surgical, Social History, Allergies, and Medications have been Reviewed.    Follow Up Instructions: I discussed the  assessment and treatment plan with the patient. The patient was provided an opportunity to ask questions and all were answered. The patient agreed with the plan and demonstrated an understanding of the instructions.  A copy of instructions were sent to the patient via MyChart unless otherwise noted below.    The patient was advised to call back or seek an in-person evaluation if the symptoms worsen or if the condition fails to improve as anticipated.    Chiquita CHRISTELLA Barefoot, NP

## 2023-01-25 ENCOUNTER — Telehealth: Payer: Self-pay | Admitting: Nurse Practitioner

## 2023-01-25 ENCOUNTER — Ambulatory Visit: Payer: Self-pay | Admitting: *Deleted

## 2023-01-25 MED ORDER — SERTRALINE HCL 50 MG PO TABS: 50.0000 mg | ORAL_TABLET | Freq: Every day | ORAL | 0 refills | Status: DC

## 2023-01-25 NOTE — Telephone Encounter (Signed)
 Patient no showed her last appointment which is why she doesn't have refills.  Please make her an appt. I have sent her a courtesy refill.

## 2023-01-25 NOTE — Telephone Encounter (Signed)
 Chief Complaint: wheezing , SOB , chest tightness, positive RSV per labs from 01/03/23. Patient unaware Symptoms: wheezing, SOB, chest tightness, body aches, when breathing out  sounds gurgley. Vomited x 2 and diarrhea. Runny nose. Uncontrollable cough but cough medication does help some. Steroid inhaler and using tylenol  alternating motrin  has helped some.  Not getting better feeling worse. Frequency: 01/03/23 Pertinent Negatives: Patient denies chest pain no difficulty breathing no fever reported  Disposition: [] ED /[] Urgent Care (no appt availability in office) / [] Appointment(In office/virtual)/ []  [facility]/ [] Home Care/ [x] Refused Recommended Disposition /[] [facility]/ []  Follow-up with PCP Additional Notes:   Patient went to [facility] clinic 01/03/23 and lab results positive for RSV. Patient was not notified and unaware and was not given results. PCP please review and advise. Patient reports sx worsening and requesting recommendations. No available appt until tomorrow. Pt already been to UC and would like to see PCP.recommended if sx worsen go to ED.  Patient would like a call back.      Reason for Disposition  [1] MILD difficulty breathing (e.g., minimal/no SOB at rest, SOB with walking, pulse <100) AND [2] NEW-onset or WORSE than normal  Answer Assessment - Initial Assessment Questions 1. RESPIRATORY STATUS: Describe your breathing? (e.g., wheezing, shortness of breath, unable to speak, severe coughing)      Severe coughing, wheezing, some shortness of breath positive RSV on 01/03/23 from [place] clinic 2. ONSET: When did this breathing problem begin?      01/03/23 3. PATTERN Does the difficult breathing come and go, or has it been constant since it started?      Wheezing now  4. SEVERITY: How bad is your breathing? (e.g., mild, moderate, severe)    - MILD: No SOB at rest, mild SOB with walking, speaks normally in sentences, can lie down, no  retractions, pulse < 100.    - MODERATE: SOB at rest, SOB with minimal exertion and prefers to sit, cannot lie down flat, speaks in phrases, mild retractions, audible wheezing, pulse 100-120.    - SEVERE: Very SOB at rest, speaks in single words, struggling to breathe, sitting hunched forward, retractions, pulse > 120      Chest tightness, coughing, wheezing 5. RECURRENT SYMPTOM: Have you had difficulty breathing before? If Yes, ask: When was the last time? and What happened that time?      Yes since 01/03/23 6. CARDIAC HISTORY: Do you have any history of heart disease? (e.g., heart attack, angina, bypass surgery, angioplasty)      na 7. LUNG HISTORY: Do you have any history of lung disease?  (e.g., pulmonary embolus, asthma, emphysema)     Hx  8. CAUSE: What do you think is causing the breathing problem?      Positive RSV 9. OTHER SYMPTOMS: Do you have any other symptoms? (e.g., dizziness, runny nose, cough, chest pain, fever)     Uncontrollable cough, cough medication has helped, steroid inhaler  and alternating tylenol  and motrin  , vomiting , diarrhea runny nose body aches  10. O2 SATURATION MONITOR:  Do you use an oxygen saturation monitor (pulse oximeter) at home? If Yes, ask: What is your reading (oxygen level) today? What is your usual oxygen saturation reading? (e.g., 95%)       na 11. PREGNANCY: Is there any chance you are pregnant? When was your last menstrual period?       na 12. TRAVEL: Have you traveled out of the country in the last month? (e.g., travel  history, exposures)       na  Protocols used: Breathing Difficulty-A-AH

## 2023-01-25 NOTE — Telephone Encounter (Signed)
 Patient needs an appt.

## 2023-01-25 NOTE — Telephone Encounter (Signed)
 Appointment has been made

## 2023-01-25 NOTE — Telephone Encounter (Signed)
 Pt stated that she is at a walk-in clinic at [facility] being seen for these concerns.

## 2023-01-25 NOTE — Telephone Encounter (Signed)
 Requested Prescriptions  Refused Prescriptions Disp Refills   NIFEdipine  (PROCARDIA -XL/NIFEDICAL-XL) 30 MG 24 hr tablet 30 tablet 11    Sig: Take 1 tablet (30 mg total) by mouth daily.     Cardiovascular: Calcium Channel Blockers 2 Failed - 01/25/2023  1:00 PM      Failed - Last Heart Rate in normal range    Pulse Readings from Last 1 Encounters:  09/04/22 (!) 121         Passed - Last BP in normal range    BP Readings from Last 1 Encounters:  09/04/22 135/79         Passed - Valid encounter within last 6 months    Recent Outpatient Visits           4 months ago Hypertension, unspecified type   [place] [facility] Mecum, Erin E, PA-C   7 months ago Depression, major, single episode, severe (HCC)   [place] [facility] Melvin Pao, NP   8 months ago Annual physical exam   [place] [facility] Melvin Pao, NP   9 months ago Environmental and seasonal allergies   [place] [facility] Mecum, Rocky BRAVO, PA-C   1 year ago Upper respiratory tract infection, unspecified type   [place] [facility] Maryjane Public, MD

## 2023-01-25 NOTE — Telephone Encounter (Signed)
Please call and schedule the patient an appointment per Karen.  

## 2023-01-25 NOTE — Telephone Encounter (Signed)
 Called patient regarding her acute problems was going to offer her one of the 11:20's she is at a walk-in clinic at [facility] being seen for these concerns.  She stated that she has been trying to get these prescriptions refilled since last week however she has not heard anything back on them.  I did let her know that the Nifedipine  the provider was not the prescribing provider so she probably still will end up needing an appointment.  And the patient also needs a refill on Sertraline , she stated that she does not have any of either medication.  Please advise.

## 2023-01-27 ENCOUNTER — Ambulatory Visit
Admission: RE | Admit: 2023-01-27 | Discharge: 2023-01-27 | Disposition: A | Discharge status: Home / Self Care | Payer: 59 | Attending: Nurse Practitioner | Admitting: Nurse Practitioner

## 2023-01-27 ENCOUNTER — Encounter: Payer: Self-pay | Admitting: Nurse Practitioner

## 2023-01-27 ENCOUNTER — Ambulatory Visit (INDEPENDENT_AMBULATORY_CARE_PROVIDER_SITE_OTHER): Payer: 59 | Admitting: Nurse Practitioner

## 2023-01-27 ENCOUNTER — Ambulatory Visit
Admission: RE | Admit: 2023-01-27 | Discharge: 2023-01-27 | Disposition: A | Discharge status: Home / Self Care | Payer: 59 | Source: Ambulatory Visit | Attending: Nurse Practitioner | Admitting: Nurse Practitioner

## 2023-01-27 VITALS — BP 149/79 | HR 77 | Ht 72.5 in | Wt 370.6 lb

## 2023-01-27 DIAGNOSIS — F419 Anxiety disorder, unspecified: Secondary | ICD-10-CM

## 2023-01-27 DIAGNOSIS — R062 Wheezing: Secondary | ICD-10-CM

## 2023-01-27 DIAGNOSIS — I10 Essential (primary) hypertension: Secondary | ICD-10-CM

## 2023-01-27 DIAGNOSIS — Z1159 Encounter for screening for other viral diseases: Secondary | ICD-10-CM

## 2023-01-27 DIAGNOSIS — F322 Major depressive disorder, single episode, severe without psychotic features: Secondary | ICD-10-CM

## 2023-01-27 MED ORDER — SERTRALINE HCL 100 MG PO TABS: 100.0000 mg | ORAL_TABLET | Freq: Every day | ORAL | 0 refills | Status: DC

## 2023-01-27 MED ORDER — NIFEDIPINE ER OSMOTIC RELEASE 30 MG PO TB24: 30.0000 mg | ORAL_TABLET | Freq: Every day | ORAL | 0 refills | Status: DC

## 2023-01-27 NOTE — Assessment & Plan Note (Signed)
 Chronic.  Controlled.  Continue with current medication regimen of Nifedipine.  Recommend taking medication consistently.  Labs ordered today.  Return to clinic in 6 months for reevaluation.  Call sooner if concerns arise.

## 2023-01-27 NOTE — Assessment & Plan Note (Signed)
 Chronic. Ongoing concern.  Has been taking two of her Zoloft due to more stress with work.  Will increase dose to 100mg .  Educated patient on how to properly take the medication.  Follow up in 3 months.  Call sooner if concerns arise.

## 2023-01-27 NOTE — Progress Notes (Addendum)
 BP (!) 149/79 (BP Location: Right Arm, Patient Position: Sitting, Cuff Size: Large)   Pulse 77   Ht 6' 0.5 (1.842 m)   Wt (!) 370 lb 9.6 oz (168.1 kg)   SpO2 97%   BMI 49.57 kg/m    Subjective:    Patient ID: Claire Walker, female    DOB: [birth year], 28 y.o.   MRN: [id number]  HPI: Claire Walker is a 28 y.o. female  Chief Complaint  Patient presents with   Follow-up   Weight Management Screening    Would like to have something to manage weight    Depression   Anxiety   HYPERTENSION without Chronic Kidney Disease Doesn't always take her medication in the morning.   Hypertension status: controlled  Satisfied with current treatment? no Duration of hypertension: years BP monitoring frequency:  daily BP range: 125-130/80 BP medication side effects:  no Medication compliance: good compliance Previous BP meds:nifedipine  Aspirin: no Recurrent headaches: no Visual changes: no Palpitations: no Dyspnea: no Chest pain: no Lower extremity edema: no Dizzy/lightheaded: no  DEPRESSION/ANXIETY Patient states she feels like her Zoloft  is working well.  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  She is sometimes taking 2 pills a day to help with her stress.       Relevant past medical, surgical, family and social history reviewed and updated as indicated. Interim medical history since our last visit reviewed. Allergies and medications reviewed and updated.  Review of Systems  Eyes:  Negative for visual disturbance.  Respiratory:  Negative for cough, chest tightness and shortness of breath.   Cardiovascular:  Negative for chest pain, palpitations and leg swelling.  Neurological:  Negative for dizziness and headaches.  Psychiatric/Behavioral:  Positive for dysphoric mood. Negative for suicidal ideas. The patient is nervous/anxious.     Per HPI unless specifically indicated above     Objective:    BP (!) 149/79 (BP Location: Right Arm, Patient  Position: Sitting, Cuff Size: Large)   Pulse 77   Ht 6' 0.5 (1.842 m)   Wt (!) 370 lb 9.6 oz (168.1 kg)   SpO2 97%   BMI 49.57 kg/m   Wt Readings from Last 3 Encounters:  01/27/23 (!) 370 lb 9.6 oz (168.1 kg)  09/04/22 (!) 371 lb 12.8 oz (168.6 kg)  06/23/22 (!) 385 lb (174.6 kg)    Physical Exam Vitals and nursing note reviewed.  Constitutional:      General: She is not in acute distress.    Appearance: Normal appearance. She is obese. She is not ill-appearing, toxic-appearing or diaphoretic.  HENT:     Head: Normocephalic.     Right Ear: External ear normal.     Left Ear: External ear normal.     Nose: Nose normal.     Mouth/Throat:     Mouth: Mucous membranes are moist.     Pharynx: Oropharynx is clear.  Eyes:     General:        Right eye: No discharge.        Left eye: No discharge.     Extraocular Movements: Extraocular movements intact.     Conjunctiva/sclera: Conjunctivae normal.     Pupils: Pupils are equal, round, and reactive to light.  Cardiovascular:     Rate and Rhythm: Normal rate and regular rhythm.     Heart sounds: No murmur heard. Pulmonary:     Effort: Pulmonary effort is normal. No respiratory distress.  Breath sounds: Wheezing present. No rales.     Comments: Crackles throughout lung fields Musculoskeletal:     Cervical back: Normal range of motion and neck supple.  Skin:    General: Skin is warm and dry.     Capillary Refill: Capillary refill takes less than 2 seconds.  Neurological:     General: No focal deficit present.     Mental Status: She is alert and oriented to person, place, and time. Mental status is at baseline.  Psychiatric:        Mood and Affect: Mood normal.        Behavior: Behavior normal.        Thought Content: Thought content normal.        Judgment: Judgment normal.     Results for orders placed or performed during the hospital encounter of 09/01/22  CBC   Collection Time: 09/01/22  6:23 PM  Result Value Ref  Range   WBC 10.8 (H) 4.0 - 10.5 K/uL   RBC 4.60 3.87 - 5.11 MIL/uL   Hemoglobin 14.2 12.0 - 15.0 g/dL   HCT 55.7 63.9 - 53.9 %   MCV 96.1 80.0 - 100.0 fL   MCH 30.9 26.0 - 34.0 pg   MCHC 32.1 30.0 - 36.0 g/dL   RDW 88.2 88.4 - 84.4 %   Platelets 245 150 - 400 K/uL   nRBC 0.0 0.0 - 0.2 %  Basic metabolic panel   Collection Time: 09/01/22  6:23 PM  Result Value Ref Range   Sodium 133 (L) 135 - 145 mmol/L   Potassium 4.5 3.5 - 5.1 mmol/L   Chloride 104 98 - 111 mmol/L   CO2 19 (L) 22 - 32 mmol/L   Glucose, Bld 97 70 - 99 mg/dL   BUN 13 6 - 20 mg/dL   Creatinine, Ser 9.14 0.44 - 1.00 mg/dL   Calcium 9.1 8.9 - 89.6 mg/dL   GFR, Estimated >39 >39 mL/min   Anion gap 10 5 - 15  Urinalysis, Routine w reflex microscopic -Urine, Clean Catch   Collection Time: 09/01/22  6:23 PM  Result Value Ref Range   Color, Urine YELLOW (A) YELLOW   APPearance CLOUDY (A) CLEAR   Specific Gravity, Urine 1.033 (H) 1.005 - 1.030   pH 5.0 5.0 - 8.0   Glucose, UA NEGATIVE NEGATIVE mg/dL   Hgb urine dipstick NEGATIVE NEGATIVE   Bilirubin Urine NEGATIVE NEGATIVE   Ketones, ur 20 (A) NEGATIVE mg/dL   Protein, ur 30 (A) NEGATIVE mg/dL   Nitrite NEGATIVE NEGATIVE   Leukocytes,Ua MODERATE (A) NEGATIVE   RBC / HPF 6-10 0 - 5 RBC/hpf   WBC, UA 21-50 0 - 5 WBC/hpf   Bacteria, UA RARE (A) NONE SEEN   Squamous Epithelial / HPF 6-10 0 - 5 /HPF   Mucus PRESENT       Assessment & Plan:   Problem List Items Addressed This Visit       Cardiovascular and Mediastinum   Hypertension   Chronic.  Controlled.  Continue with current medication regimen of Nifedipine .  Recommend taking medication consistently.  Labs ordered today.  Return to clinic in 6 months for reevaluation.  Call sooner if concerns arise.        Relevant Medications   NIFEdipine  (PROCARDIA -XL/NIFEDICAL-XL) 30 MG 24 hr tablet   Other Relevant Orders   Comp Met (CMET)     Other   Depression, major, single episode, severe (HCC) - Primary    Chronic. Ongoing concern.  Has been taking two of her Zoloft  due to more stress with work.  Will increase dose to 100mg .  Educated patient on how to properly take the medication.  Follow up in 3 months.  Call sooner if concerns arise.       Relevant Medications   sertraline  (ZOLOFT ) 100 MG tablet   Morbid obesity (HCC)   Recommended eating smaller high protein, low fat meals more frequently and exercising 30 mins a day 5 times a week with a goal of 10-15lb weight loss in the next 3 months.       Anxiety   Chronic. Ongoing concern.  Has been taking two of her Zoloft  due to more stress with work.  Will increase dose to 100mg .  Educated patient on how to properly take the medication.  Follow up in 3 months.  Call sooner if concerns arise.       Relevant Medications   sertraline  (ZOLOFT ) 100 MG tablet   Other Visit Diagnoses       Encounter for hepatitis C screening test for low risk patient       Relevant Orders   Hepatitis C Antibody     Wheezing       Diagnosed with Flu A two days ago.  Wheezing and crackles on exam.  Will order chest xray for evaluation and further treatment.   Relevant Orders   DG Chest 2 View        Follow up plan: Return in about 3 months (around 04/27/2023) for HTN, HLD, DM2 FU.

## 2023-01-27 NOTE — Assessment & Plan Note (Signed)
 Recommended eating smaller high protein, low fat meals more frequently and exercising 30 mins a day 5 times a week with a goal of 10-15lb weight loss in the next 3 months.

## 2023-01-28 LAB — COMPREHENSIVE METABOLIC PANEL
ALT: 38 [IU]/L — ABNORMAL HIGH (ref 0–32)
AST: 32 [IU]/L (ref 0–40)
Albumin: 3.9 g/dL — ABNORMAL LOW (ref 4.0–5.0)
Alkaline Phosphatase: 56 [IU]/L (ref 44–121)
BUN/Creatinine Ratio: 13 (ref 9–23)
BUN: 10 mg/dL (ref 6–20)
Bilirubin Total: <0.2 mg/dL (ref 0.0–1.2)
CO2: 22 mmol/L (ref 20–29)
Calcium: 9.1 mg/dL (ref 8.7–10.2)
Chloride: 107 mmol/L — ABNORMAL HIGH (ref 96–106)
Creatinine, Ser: 0.75 mg/dL (ref 0.57–1.00)
Globulin, Total: 2.6 g/dL (ref 1.5–4.5)
Glucose: 102 mg/dL — ABNORMAL HIGH (ref 70–99)
Potassium: 4.3 mmol/L (ref 3.5–5.2)
Sodium: 143 mmol/L (ref 134–144)
Total Protein: 6.5 g/dL (ref 6.0–8.5)
eGFR: 111 mL/min/{1.73_m2} (ref >59)

## 2023-01-28 LAB — HEPATITIS C ANTIBODY: Hep C Virus Ab: NONREACTIVE

## 2023-03-30 ENCOUNTER — Other Ambulatory Visit: Payer: Self-pay | Admitting: Nurse Practitioner

## 2023-03-31 NOTE — Telephone Encounter (Signed)
 Reordered 01/27/23 #90   Requested Prescriptions  Refused Prescriptions Disp Refills   sertraline (ZOLOFT) 100 MG tablet [Pharmacy Med Name: SERTRALINE HCL 100 MG TABLET] 90 tablet 0    Sig: TAKE ONE TABLET BY MOUTH DAILY     Psychiatry:  Antidepressants - SSRI - sertraline Failed - 03/31/2023  1:14 PM      Failed - ALT in normal range and within 360 days    ALT  Date Value Ref Range Status  01/27/2023 38 (H) 0 - 32 IU/L Final         Passed - AST in normal range and within 360 days    AST  Date Value Ref Range Status  01/27/2023 32 0 - 40 IU/L Final         Passed - Completed PHQ-2 or PHQ-9 in the last 360 days      Passed - Valid encounter within last 6 months    Recent Outpatient Visits           2 months ago Depression, major, single episode, severe (HCC)   [place] [facility] Larae Grooms, NP   6 months ago Hypertension, unspecified type   [place] [facility] Mecum, Erin E, PA-C   9 months ago Depression, major, single episode, severe [facility])   [place] [facility] Larae Grooms, NP   10 months ago Annual physical exam   [place] [facility] Larae Grooms, NP   11 months ago Environmental and seasonal allergies   [place] [facility] Mecum, Oswaldo [place], [place]       Future Appointments             In 3 weeks Larae Grooms, NP [place] [facility], [facility]

## 2023-04-19 ENCOUNTER — Other Ambulatory Visit: Payer: Self-pay | Admitting: Nurse Practitioner

## 2023-04-21 NOTE — Telephone Encounter (Signed)
 Last OV 01/27/23 within protocol.  Requested Prescriptions  Pending Prescriptions Disp Refills   sertraline (ZOLOFT) 100 MG tablet [Pharmacy Med Name: SERTRALINE HCL 100 MG TABLET] 90 tablet 0    Sig: TAKE ONE TABLET BY MOUTH DAILY     Psychiatry:  Antidepressants - SSRI - sertraline Failed - 04/21/2023  8:51 AM      Failed - ALT in normal range and within 360 days    ALT  Date Value Ref Range Status  01/27/2023 38 (H) 0 - 32 IU/L Final         Failed - Valid encounter within last 6 months    Recent Outpatient Visits   None     Future Appointments             In 6 days Larae Grooms, NP [facility]            Passed - AST in normal range and within 360 days    AST  Date Value Ref Range Status  01/27/2023 32 0 - 40 IU/L Final         Passed - Completed PHQ-2 or PHQ-9 in the last 360 days

## 2023-04-27 ENCOUNTER — Encounter: Payer: Self-pay | Admitting: Nurse Practitioner

## 2023-04-27 ENCOUNTER — Ambulatory Visit: Payer: Self-pay | Admitting: Nurse Practitioner

## 2023-04-27 VITALS — BP 110/80 | HR 80 | Temp 98.6°F | Resp 16 | Ht 72.52 in | Wt 378.8 lb

## 2023-04-27 DIAGNOSIS — R41840 Attention and concentration deficit: Secondary | ICD-10-CM

## 2023-04-27 DIAGNOSIS — F322 Major depressive disorder, single episode, severe without psychotic features: Secondary | ICD-10-CM | POA: Diagnosis not present

## 2023-04-27 DIAGNOSIS — I10 Essential (primary) hypertension: Secondary | ICD-10-CM | POA: Diagnosis not present

## 2023-04-27 NOTE — Assessment & Plan Note (Signed)
 Chronic. Controlled.  Doing well with Zoloft 100mg .   Educated patient on how to properly take the medication.  Follow up in 3 months.  Call sooner if concerns arise.

## 2023-04-27 NOTE — Assessment & Plan Note (Signed)
 Recommended eating smaller high protein, low fat meals more frequently and exercising 30 mins a day 5 times a week with a goal of 10-15lb weight loss in the next 3 months.

## 2023-04-27 NOTE — Progress Notes (Signed)
 BP 110/80 (BP Location: Left Arm, Patient Position: Sitting, Cuff Size: Large)   Pulse 80   Temp 98.6 F (37 C) (Oral)   Resp 16   Ht 6' 0.52" (1.842 m)   Wt (!) 378 lb 12.8 oz (171.8 kg)   LMP 04/20/2023 (Approximate)   SpO2 97%   BMI 50.64 kg/m    Subjective:    Patient ID: Devoiry Neuberger, female    DOB: [birth year], 28 y.o.   MRN: [id number]  HPI: Evora Thorington is a 28 y.o. female  Chief Complaint  Patient presents with   Hypertension    Med check   Depression    Doing oj. Managed well on Sertraline.    Obesity    Not doing well with this   HYPERTENSION without Chronic Kidney Disease Hypertension status: controlled  Satisfied with current treatment? no Duration of hypertension: years BP monitoring frequency:  not checking BP range:  BP medication side effects:  no Medication compliance: good compliance Previous BP meds:nifedipine Aspirin: no Recurrent headaches: no Visual changes: no Palpitations: no Dyspnea: no Chest pain: no Lower extremity edema: no Dizzy/lightheaded: no  DEPRESSION/ANXIETY Patient states she feels like her Zoloft is working well.  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  She feels like she has trouble focusing at times.  She feels spaced out.       Relevant past medical, surgical, family and social history reviewed and updated as indicated. Interim medical history since our last visit reviewed. Allergies and medications reviewed and updated.  Review of Systems  Eyes:  Negative for visual disturbance.  Respiratory:  Negative for cough, chest tightness and shortness of breath.   Cardiovascular:  Negative for chest pain, palpitations and leg swelling.  Neurological:  Negative for dizziness and headaches.  Psychiatric/Behavioral:  Positive for dysphoric mood. Negative for suicidal ideas. The patient is nervous/anxious.     Per HPI unless specifically indicated above     Objective:    BP 110/80 (BP  Location: Left Arm, Patient Position: Sitting, Cuff Size: Large)   Pulse 80   Temp 98.6 F (37 C) (Oral)   Resp 16   Ht 6' 0.52" (1.842 m)   Wt (!) 378 lb 12.8 oz (171.8 kg)   LMP 04/20/2023 (Approximate)   SpO2 97%   BMI 50.64 kg/m   Wt Readings from Last 3 Encounters:  04/27/23 (!) 378 lb 12.8 oz (171.8 kg)  01/27/23 (!) 370 lb 9.6 oz (168.1 kg)  09/04/22 (!) 371 lb 12.8 oz (168.6 kg)    Physical Exam Vitals and nursing note reviewed.  Constitutional:      General: She is not in acute distress.    Appearance: Normal appearance. She is normal weight. She is not ill-appearing, toxic-appearing or diaphoretic.  HENT:     Head: Normocephalic.     Right Ear: External ear normal.     Left Ear: External ear normal.     Nose: Nose normal.     Mouth/Throat:     Mouth: Mucous membranes are moist.     Pharynx: Oropharynx is clear.  Eyes:     General:        Right eye: No discharge.        Left eye: No discharge.     Extraocular Movements: Extraocular movements intact.     Conjunctiva/sclera: Conjunctivae normal.     Pupils: Pupils are equal, round, and reactive to light.  Cardiovascular:     Rate  and Rhythm: Normal rate and regular rhythm.     Heart sounds: No murmur heard. Pulmonary:     Effort: Pulmonary effort is normal. No respiratory distress.     Breath sounds: Normal breath sounds. No wheezing or rales.  Musculoskeletal:     Cervical back: Normal range of motion and neck supple.  Skin:    General: Skin is warm and dry.     Capillary Refill: Capillary refill takes less than 2 seconds.  Neurological:     General: No focal deficit present.     Mental Status: She is alert and oriented to person, place, and time. Mental status is at baseline.  Psychiatric:        Mood and Affect: Mood normal.        Behavior: Behavior normal.        Thought Content: Thought content normal.        Judgment: Judgment normal.     Results for orders placed or performed in visit on  01/27/23  Comp Met (CMET)   Collection Time: 01/27/23  8:33 AM  Result Value Ref Range   Glucose 102 (H) 70 - 99 mg/dL   BUN 10 6 - 20 mg/dL   Creatinine, Ser 2.44 0.57 - 1.00 mg/dL   eGFR 010 >27 OZ/DGU/4.40   BUN/Creatinine Ratio 13 9 - 23   Sodium 143 134 - 144 mmol/L   Potassium 4.3 3.5 - 5.2 mmol/L   Chloride 107 (H) 96 - 106 mmol/L   CO2 22 20 - 29 mmol/L   Calcium 9.1 8.7 - 10.2 mg/dL   Total Protein 6.5 6.0 - 8.5 g/dL   Albumin 3.9 (L) 4.0 - 5.0 g/dL   Globulin, Total 2.6 1.5 - 4.5 g/dL   Bilirubin Total <3.4 0.0 - 1.2 mg/dL   Alkaline Phosphatase 56 44 - 121 IU/L   AST 32 0 - 40 IU/L   ALT 38 (H) 0 - 32 IU/L  Hepatitis C Antibody   Collection Time: 01/27/23  8:33 AM  Result Value Ref Range   Hep C Virus Ab Non Reactive Non Reactive      Assessment & Plan:   Problem List Items Addressed This Visit       Cardiovascular and Mediastinum   Hypertension   Chronic.  Controlled.  Continue with current medication regimen of Nifedipine.  Refills sent today.  Labs ordered today.  Return to clinic in 6 months for reevaluation.  Call sooner if concerns arise.       Relevant Orders   Comp Met (CMET)     Other   Depression, major, single episode, severe (HCC) - Primary   Chronic. Controlled.  Doing well with Zoloft 100mg .   Educated patient on how to properly take the medication.  Follow up in 3 months.  Call sooner if concerns arise.       Morbid obesity (HCC)   Recommended eating smaller high protein, low fat meals more frequently and exercising 30 mins a day 5 times a week with a goal of 10-15lb weight loss in the next 3 months.       Other Visit Diagnoses       Difficulty concentrating       Referral placed for testing for ADHD.   Relevant Orders   Ambulatory referral to Neuropsychology        Follow up plan: Return in about 3 months (around 07/27/2023) for Depression/Anxiety FU.

## 2023-04-27 NOTE — Assessment & Plan Note (Signed)
 Chronic.  Controlled.  Continue with current medication regimen of Nifedipine.  Refills sent today.  Labs ordered today.  Return to clinic in 6 months for reevaluation.  Call sooner if concerns arise.

## 2023-05-05 ENCOUNTER — Ambulatory Visit: Payer: Self-pay

## 2023-05-05 ENCOUNTER — Other Ambulatory Visit: Payer: Self-pay | Admitting: Nurse Practitioner

## 2023-05-05 NOTE — Telephone Encounter (Signed)
 Copied from CRM [id number]. Topic: Clinical - Red Word Triage >> May 05, 2023  4:31 PM Marissa P wrote: Red Word that prompted transfer to Nurse Triage: Patient called in is experiencing high blood pressure, warm connected over to nurse   Chief Complaint: High blood pressure  Symptoms: No symptoms at this time  Pertinent Negatives: Patient denies any current symptoms  Disposition: [] ED /[] Urgent Care (no appt availability in office) / [] Appointment(In office/virtual)/ []  [facility]/ [] Home Care/ [] Refused Recommended Disposition /[] [facility]/ [x]  Follow-up with PCP Additional Notes: Patient states that she has been out of her blood pressure medication for about 1-2 weeks. She states that there are no refills left and would like a refill sent to the pharmacy. I advised that there appears to be a refill request already but that I would forward this along to the office so they can look into the request as well. Patient had no further questions at this time.    Reason for Disposition  Ran out of BP medications  Answer Assessment - Initial Assessment Questions 1. BLOOD PRESSURE: "What is the blood pressure?" "Did you take at least two measurements 5 minutes apart?"     152/100 2. ONSET: "When did you take your blood pressure?"     45 minutes ago  3. HOW: "How did you take your blood pressure?" (e.g., automatic home BP monitor, visiting nurse)     Automatic home BP cuff  4. HISTORY: "Do you have a history of high blood pressure?"     Yes 5. MEDICINES: "Are you taking any medicines for blood pressure?" "Have you missed any doses recently?"     Out of medications  6. OTHER SYMPTOMS: "Do you have any symptoms?" (e.g., blurred vision, chest pain, difficulty breathing, headache, weakness)     No 7. PREGNANCY: "Is there any chance you are pregnant?" "When was your last menstrual period?"     No  Protocols used: Blood Pressure - High-A-AH

## 2023-05-06 MED ORDER — NIFEDIPINE ER OSMOTIC RELEASE 30 MG PO TB24: 30.0000 mg | ORAL_TABLET | Freq: Every day | ORAL | 0 refills | Status: AC

## 2023-05-06 NOTE — Telephone Encounter (Signed)
 Patient recently seen 04/27/23, last refill was in January.

## 2023-05-06 NOTE — Addendum Note (Signed)
 Addended by: Delphia Ficks on: 05/06/2023 11:21 AM   Modules accepted: Orders

## 2023-05-06 NOTE — Telephone Encounter (Signed)
 Called and notified patient that her medication has been sent in for her.

## 2023-05-06 NOTE — Telephone Encounter (Signed)
 Requested Prescriptions  Pending Prescriptions Disp Refills   NIFEdipine (PROCARDIA-XL/NIFEDICAL-XL) 30 MG 24 hr tablet [Pharmacy Med Name: NIFEDIPINE ER 30 MG TABLET] 90 tablet 0    Sig: TAKE 1 TABLET BY MOUTH EVERY DAY     Cardiovascular: Calcium Channel Blockers 2 Passed - 05/06/2023  2:09 PM      Passed - Last BP in normal range    BP Readings from Last 1 Encounters:  04/27/23 110/80         Passed - Last Heart Rate in normal range    Pulse Readings from Last 1 Encounters:  04/27/23 80         Passed - Valid encounter within last 6 months    Recent Outpatient Visits           1 week ago Depression, major, single episode, severe (HCC)   [facility] [name], NP               Rx was sent as "print" earlier.

## 2023-06-07 ENCOUNTER — Other Ambulatory Visit: Payer: Self-pay | Admitting: Nurse Practitioner

## 2023-06-09 NOTE — Telephone Encounter (Signed)
 Requested Prescriptions  Pending Prescriptions Disp Refills   sertraline  (ZOLOFT ) 100 MG tablet [Pharmacy Med Name: SERTRALINE  HCL 100 MG TABLET] 90 tablet 0    Sig: TAKE 1 TABLET BY MOUTH EVERY DAY     Psychiatry:  Antidepressants - SSRI - sertraline  Failed - 06/09/2023 11:52 AM      Failed - ALT in normal range and within 360 days    ALT  Date Value Ref Range Status  01/27/2023 38 (H) 0 - 32 IU/L Final         Passed - AST in normal range and within 360 days    AST  Date Value Ref Range Status  01/27/2023 32 0 - 40 IU/L Final         Passed - Completed PHQ-2 or PHQ-9 in the last 360 days      Passed - Valid encounter within last 6 months    Recent Outpatient Visits           1 month ago Depression, major, single episode, severe [facility])   [facility] [name], NP

## 2023-06-28 ENCOUNTER — Emergency Department
Admission: EM | Admit: 2023-06-28 | Discharge: 2023-06-28 | Disposition: A | Discharge status: Home / Self Care | Attending: Emergency Medicine | Admitting: Emergency Medicine

## 2023-06-28 ENCOUNTER — Encounter: Payer: Self-pay | Admitting: Emergency Medicine

## 2023-06-28 ENCOUNTER — Other Ambulatory Visit: Payer: Self-pay

## 2023-06-28 ENCOUNTER — Emergency Department

## 2023-06-28 DIAGNOSIS — I10 Essential (primary) hypertension: Secondary | ICD-10-CM | POA: Insufficient documentation

## 2023-06-28 DIAGNOSIS — M5441 Lumbago with sciatica, right side: Secondary | ICD-10-CM | POA: Insufficient documentation

## 2023-06-28 DIAGNOSIS — R109 Unspecified abdominal pain: Secondary | ICD-10-CM | POA: Insufficient documentation

## 2023-06-28 DIAGNOSIS — M5431 Sciatica, right side: Secondary | ICD-10-CM

## 2023-06-28 LAB — URINALYSIS, ROUTINE W REFLEX MICROSCOPIC
Bacteria, UA: NONE SEEN
Bilirubin Urine: NEGATIVE
Glucose, UA: NEGATIVE mg/dL
Ketones, ur: NEGATIVE mg/dL
Nitrite: NEGATIVE
Protein, ur: NEGATIVE mg/dL
Specific Gravity, Urine: 1.024 (ref 1.005–1.030)
pH: 5 (ref 5.0–8.0)

## 2023-06-28 LAB — POC URINE PREG, ED: Preg Test, Ur: NEGATIVE

## 2023-06-28 MED ORDER — OXYCODONE HCL 5 MG PO TABS
5.0000 mg | ORAL_TABLET | Freq: Once | ORAL | Status: AC
  Administered 2023-06-28: 5 mg via ORAL
  Filled 2023-06-28: qty 1

## 2023-06-28 MED ORDER — PREDNISONE 10 MG (21) PO TBPK
ORAL_TABLET | ORAL | 0 refills | Status: AC
Start: 2023-06-28 — End: ?

## 2023-06-28 MED ORDER — ONDANSETRON 4 MG PO TBDP
4.0000 mg | ORAL_TABLET | Freq: Once | ORAL | Status: AC
  Administered 2023-06-28: 4 mg via ORAL
  Filled 2023-06-28: qty 1

## 2023-06-28 MED ORDER — OXYCODONE-ACETAMINOPHEN 7.5-325 MG PO TABS: 1.0000 | ORAL_TABLET | ORAL | 0 refills | Status: AC | PRN

## 2023-06-28 NOTE — Discharge Instructions (Signed)
 Take a stool softner if taking the pain medication.  Take the prednisone  until finished.  Follow up with primary care if not improving over the week. You may also schedule with neurology if the issue persists.  Return to the ER for symptoms of concern if unable to see primary care or the specialist.

## 2023-06-28 NOTE — ED Notes (Signed)
 See triage notes. Patient c/o lower right back pain. Patient stated it hurts to move.

## 2023-06-28 NOTE — ED Triage Notes (Signed)
 Patient to ED via POV for right sided lower back pain. Ongoing since Thursday. Also having some difficulty urinating. NAD noted.

## 2023-06-28 NOTE — ED Provider Notes (Signed)
 [facility] Provider Note    Event Date/Time   First MD Initiated Contact with Patient 06/28/23 [phone]     (approximate)   History   Back Pain   HPI  Claire Walker is a 28 y.o. female  with history of hypertension and as listed in EMR presents to the emergency department for treatment and evaluation of right-sided low back pain.  Symptoms started Thursday but have progressively worsened.  Pain increases with movement.  Sitting on the toilet, riding in vehicle, or sitting up straight also makes pain worse.  She denies dysuria, urinary frequency, or hematuria.  No history of kidney stone.  No known specific injury to trigger this pain.  Remote history of back injury while in high school but no surgery required.  She has taken over-the-counter Tylenol /ibuprofen  which had helped initially but is no longer giving her any relief.      Physical Exam   Triage Vital Signs: ED Triage Vitals  Encounter Vitals Group     BP 06/28/23 0918 130/81     Systolic BP Percentile --      Diastolic BP Percentile --      Pulse Rate 06/28/23 0916 87     Resp 06/28/23 0916 18     Temp 06/28/23 0916 98 F (36.7 C)     Temp Source 06/28/23 0916 Oral     SpO2 06/28/23 0916 97 %     Weight 06/28/23 0916 (!) 390 lb (176.9 kg)     Height 06/28/23 0916 6' (1.829 m)     Head Circumference --      Peak Flow --      Pain Score 06/28/23 0916 9     Pain Loc --      Pain Education --      Exclude from Growth Chart --     Most recent vital signs: Vitals:   06/28/23 0916 06/28/23 0918  BP:  130/81  Pulse: 87   Resp: 18   Temp: 98 F (36.7 C)   SpO2: 97%     General: Awake, no distress.  CV:  Good peripheral perfusion.  Resp:  Normal effort.  Abd:  No distention.  Other:  No CVA tenderness.  Pain located in the right lumbar/sacral area.  No saddle anesthesia.  No bowel or bladder incontinence or defecation pattern changes.  No history of IV drug use.  No chronic steroid use.   Pain is not worse at night.  No history of cancer.   ED Results / Procedures / Treatments   Labs (all labs ordered are listed, but only abnormal results are displayed) Labs Reviewed  URINALYSIS, ROUTINE W REFLEX MICROSCOPIC - Abnormal; Notable for the following components:      Result Value   Color, Urine YELLOW (*)    APPearance HAZY (*)    Hgb urine dipstick LARGE (*)    Leukocytes,Ua SMALL (*)    All other components within normal limits  POC URINE PREG, ED     EKG  Not indicated.   RADIOLOGY  Image and radiology report reviewed and interpreted by me. Radiology report consistent with the same.  No acute findings on CT renal stone study. CT lumbar indicates disc bulge at L4-L5 with probable compression of nerve root at L5  PROCEDURES:  Critical Care performed: No  Procedures   MEDICATIONS ORDERED IN ED:  Medications  oxyCODONE  (Oxy IR/ROXICODONE ) immediate release tablet 5 mg (5 mg Oral Given 06/28/23 0953)  oxyCODONE  (Oxy  IR/ROXICODONE ) immediate release tablet 5 mg (5 mg Oral Given 06/28/23 1105)  ondansetron  (ZOFRAN -ODT) disintegrating tablet 4 mg (4 mg Oral Given 06/28/23 1105)     IMPRESSION / MDM / ASSESSMENT AND PLAN / ED COURSE   I have reviewed the triage note.  Differential diagnosis includes, but is not limited to, acute cystitis, pyelonephritis, kidney stone, musculoskeletal pain, muscle strain, sciatica  Patient's presentation is most consistent with acute illness / injury with system symptoms.  28 year old female presenting to the emergency department for treatment and evaluation of gradually worsening back pain that became severe this morning.  See HPI for further details.  On exam, she has pain with movement.  No specific CVA tenderness. Plan will be to get CT to evaluate for kidney stone/pyelonephritis and give pain medication.  Vital signs are reassuring.  She is afebrile and not tachycardic or hypertensive.  CT scan rules out kidney stone  however she does have bulging disc at L4-L5 with likely subsequent nerve root compression at L5.  This is likely the source of her pain.  Results were discussed with the patient and family.  Plan will be to treat her with a tapered dose of prednisone  and provide pain medication.  She was encouraged to take medication as prescribed and follow-up with neurology/neurosurgery if not improving over the week.  ER return precautions also discussed.  Clinical Course as of 06/29/23 1503  Tue Jun 29, 2023  1457 CT L-SPINE NO CHARGE [CT]    Clinical Course User Index [CT] Isidoro Santillana B, FNP     FINAL CLINICAL IMPRESSION(S) / ED DIAGNOSES   Final diagnoses:  Sciatica of right side     Rx / DC Orders   ED Discharge Orders          Ordered    predniSONE  (STERAPRED UNI-PAK 21 TAB) 10 MG (21) TBPK tablet        06/28/23 1139    oxyCODONE -acetaminophen  (PERCOCET) 7.5-325 MG tablet  Every 4 hours PRN        06/28/23 1139             Note:  This document was prepared using Dragon voice recognition software and may include unintentional dictation errors.   Sherryle Don, FNP 06/29/23 1503    Viviano Ground, MD 06/30/23 1636

## 2023-07-31 ENCOUNTER — Other Ambulatory Visit: Payer: Self-pay | Admitting: Nurse Practitioner

## 2023-08-02 NOTE — Telephone Encounter (Signed)
 Requested Prescriptions  Pending Prescriptions Disp Refills   NIFEdipine  (PROCARDIA -XL/NIFEDICAL-XL) 30 MG 24 hr tablet [Pharmacy Med Name: NIFEDIPINE  ER 30 MG TABLET] 90 tablet 0    Sig: TAKE 1 TABLET BY MOUTH EVERY DAY     Cardiovascular: Calcium Channel Blockers 2 Passed - 08/02/2023  4:17 PM      Passed - Last BP in normal range    BP Readings from Last 1 Encounters:  06/28/23 130/81         Passed - Last Heart Rate in normal range    Pulse Readings from Last 1 Encounters:  06/28/23 87         Passed - Valid encounter within last 6 months    Recent Outpatient Visits           3 months ago Depression, major, single episode, severe (HCC)   [facility] Melvin Pao, NP

## 2023-12-01 ENCOUNTER — Ambulatory Visit: Payer: Self-pay

## 2023-12-01 ENCOUNTER — Ambulatory Visit: Admitting: Pediatrics

## 2023-12-01 VITALS — BP 140/80 | HR 81 | Temp 97.9°F | Ht 72.5 in | Wt 389.0 lb

## 2023-12-01 DIAGNOSIS — J3089 Other allergic rhinitis: Secondary | ICD-10-CM | POA: Diagnosis not present

## 2023-12-01 DIAGNOSIS — I10 Essential (primary) hypertension: Secondary | ICD-10-CM

## 2023-12-01 DIAGNOSIS — R052 Subacute cough: Secondary | ICD-10-CM | POA: Diagnosis not present

## 2023-12-01 MED ORDER — METHYLPREDNISOLONE 4 MG PO TBPK
ORAL_TABLET | ORAL | 0 refills | Status: AC
Start: 2023-12-01 — End: ?

## 2023-12-01 MED ORDER — FLUTICASONE PROPIONATE 50 MCG/ACT NA SUSP: 2.0000 | Freq: Every day | NASAL | 6 refills | Status: AC

## 2023-12-01 NOTE — Progress Notes (Signed)
 Office Visit  BP (!) 140/80   Pulse 81   Temp 97.9 F (36.6 C) (Oral)   Ht 6' 0.5 (1.842 m)   Wt (!) 389 lb (176.4 kg)   LMP 11/24/2023   SpO2 98%   BMI 52.03 kg/m    Subjective:    Patient ID: Claire Walker, female    DOB: 02-22-1995, 28 y.o.   MRN: [id number]  HPI: Claire Walker is a 28 y.o. female  Chief Complaint  Patient presents with   Cough    This cough 1.5 weeks but ongoing since Sept, every 2 weeks, possible mold in the home   Back Pain    Since this morning    Discussed the use of AI scribe software for clinical note transcription with the patient, who gave verbal consent to proceed.  History of Present Illness   Claire Walker is a 28 year old female who presents with a persistent cough and sinus issues.  She has been experiencing a persistent cough since September, characterized by intermittent episodes. The cough is sometimes dry, but she also produces green mucus. These episodes occur every two weeks, lasting for about two weeks each time, and then resolve before recurring. This pattern has been consistent, making it difficult for her to manage alongside her work responsibilities.  She experiences significant sinus pressure and facial soreness, particularly in the upper areas. She suspects possible mold exposure in her older home, where she has lived since May, correlating the onset of her symptoms with her move there.  She has been using Afrin nasal spray heavily, which helps her breathe better and clear some mucus, although it causes irritation. She also uses Mucinex DM, alternates Tylenol  and ibuprofen , and takes cough medicine at night to aid sleep. She has an albuterol  inhaler but avoids using it due to her high blood pressure, which is managed with medication that causes severe migraines.  Her medical history includes high blood pressure managed with medication that causes migraines. The patient reports that she has not been to the doctor for these symptoms and  has been trying to manage them on her own.      Relevant past medical, surgical, family and social history reviewed and updated as indicated. Interim medical history since our last visit reviewed. Allergies and medications reviewed and updated.  ROS per HPI unless specifically indicated above     Objective:    BP (!) 140/80   Pulse 81   Temp 97.9 F (36.6 C) (Oral)   Ht 6' 0.5 (1.842 m)   Wt (!) 389 lb (176.4 kg)   LMP 11/24/2023   SpO2 98%   BMI 52.03 kg/m   Wt Readings from Last 3 Encounters:  12/01/23 (!) 389 lb (176.4 kg)  06/28/23 (!) 390 lb (176.9 kg)  04/27/23 (!) 378 lb 12.8 oz (171.8 kg)     Physical Exam Constitutional:      Appearance: Normal appearance.  HENT:     Nose:     Right Turbinates: Enlarged.     Left Turbinates: Enlarged.     Right Sinus: Maxillary sinus tenderness present. No frontal sinus tenderness.     Left Sinus: Maxillary sinus tenderness present. No frontal sinus tenderness.  Cardiovascular:     Rate and Rhythm: Normal rate and regular rhythm.     Pulses: Normal pulses.     Heart sounds: Normal heart sounds.  Pulmonary:     Effort: Pulmonary effort is normal.     Breath sounds: Normal  breath sounds.  Abdominal:     General: Abdomen is flat.     Palpations: Abdomen is soft.  Musculoskeletal:        General: Normal range of motion.  Skin:    Comments: Normal skin color  Neurological:     General: No focal deficit present.     Mental Status: She is alert. Mental status is at baseline.  Psychiatric:        Mood and Affect: Mood normal.        Behavior: Behavior normal.        Thought Content: Thought content normal.         12/01/2023   10:03 AM 04/27/2023    9:57 AM 06/16/2022    3:57 PM 05/12/2022    3:03 PM 04/23/2022    3:42 PM  Depression screen PHQ 2/9  Decreased Interest 1 1 0 2 0  Down, Depressed, Hopeless 1 0 0 2 0  PHQ - 2 Score 2 1 0 4 0  Altered sleeping 1 0 0 3 0  Tired, decreased energy 1 0 1 3 0  Change in  appetite 1 3 0 3 3  Feeling bad or failure about yourself  3 0 0 3 0  Trouble concentrating 3 3 0 3 1  Moving slowly or fidgety/restless  0 0 0 0  Suicidal thoughts 0 0 0 0 0  PHQ-9 Score 11 7  1  19  4    Difficult doing work/chores Somewhat difficult  Not difficult at all Very difficult Not difficult at all     Data saved with a previous flowsheet row definition       12/01/2023   10:03 AM 04/27/2023    9:58 AM 06/16/2022    3:57 PM 05/12/2022    3:04 PM  GAD 7 : Generalized Anxiety Score  Nervous, Anxious, on Edge 1 0 1 3  Control/stop worrying 1 0 1 3  Worry too much - different things 1 0 0 3  Trouble relaxing 1 0 0 3  Restless 1 0 0 1  Easily annoyed or irritable 1 0 0 3  Afraid - awful might happen 1 0 0 1  Total GAD 7 Score 7 0 2 17  Anxiety Difficulty Somewhat difficult Not difficult at all Not difficult at all Very difficult       Assessment & Plan:  Assessment & Plan   Subacute cough Environmental and seasonal allergies Intermittent productive cough with green phlegm, sinus pressure, and facial pain. Possible mold exposure. Differential includes allergic rhinitis vs viral sinusitis. Postnasal drip considered as primary driver of cough so will aim to treat that as well. Xray sent to further w/u cough but exam reassuring.  - Prescribed Medrol  pack. - Ordered chest x-ray. - Prescribed Flonase, two sprays daily. - Discussed potential ENT referral if symptoms recur. - Advised follow-up with PCP in 2-3 weeks. -     Fluticasone Propionate; Place 2 sprays into both nostrils daily.  Dispense: 16 g; Refill: 6 -     methylPREDNISolone ; Follow instruction on packaging  Dispense: 21 each; Refill: 0 -     DG Chest 2 View; Future  Primary hypertension Managed with medication. Reports migraines as side effect. Cautious with albuterol  due to hypertension. - Advised follow-up with PCP for hypertension management and medication adjustments.    Follow up plan: Return in about 2  weeks (around 12/15/2023) for cough.  Claire SHAUNNA Nett, MD

## 2023-12-01 NOTE — Telephone Encounter (Signed)
  FYI Only or Action Required?: FYI only for provider: appointment scheduled on 12/01/23.  Patient was last seen in primary care on 04/27/2023 by Melvin Pao, NP.  Called Nurse Triage reporting Cough.  Symptoms began a week ago.  Interventions attempted: OTC medications: Afrin, cough and cold syrup and Rest, hydration, or home remedies.  Symptoms are: unchanged.  Triage Disposition: See Physician Within 24 Hours  Patient/caregiver understands and will follow disposition?: Yes    Copied from CRM [id number]. Topic: Clinical - Red Word Triage >> Dec 01, 2023  8:41 AM Treva T wrote: [facility] that prompted transfer to Nurse Triage: Patient states she is having a productive cough, with chest and nasal congestion, with green colored mucous, no fever.   Patient requesting an appointment for evaluation. Reason for Disposition  SEVERE coughing spells (e.g., whooping sound after coughing, vomiting after coughing)  Answer Assessment - Initial Assessment Questions 1. ONSET: When did the cough begin?      1.5 weeks 2. SEVERITY: How bad is the cough today?      strong 3. SPUTUM: Describe the color of your sputum (e.g., none, dry cough; clear, white, yellow, green)     green 4. HEMOPTYSIS: Are you coughing up any blood? If Yes, ask: How much? (e.g., flecks, streaks, tablespoons, etc.)     denies 5. DIFFICULTY BREATHING: Are you having difficulty breathing? If Yes, ask: How bad is it? (e.g., mild, moderate, severe)      denies 6. FEVER: Do you have a fever? If Yes, ask: What is your temperature, how was it measured, and when did it start?     Denies  7. CARDIAC HISTORY: Do you have any history of heart disease? (e.g., heart attack, congestive heart failure)       8. LUNG HISTORY: Do you have any history of lung disease?  (e.g., pulmonary embolus, asthma, emphysema)      9. PE RISK FACTORS: Do you have a history of blood clots? (or: recent major surgery, recent  prolonged travel, bedridden)      10. OTHER SYMPTOMS: Do you have any other symptoms? (e.g., runny nose, wheezing, chest pain)       Chest congestion 11. PREGNANCY: Is there any chance you are pregnant? When was your last menstrual period?        12. TRAVEL: Have you traveled out of the country in the last month? (e.g., travel history, exposures)  Protocols used: Cough - Acute Productive-A-AH

## 2023-12-01 NOTE — Patient Instructions (Signed)
 Please go get xray at: [facility] [address] [place],  [place]  [address]  I am sending a short medrol  pack and recommend starting daily flonase

## 2023-12-07 ENCOUNTER — Encounter: Payer: Self-pay | Admitting: Pediatrics

## 2023-12-08 ENCOUNTER — Other Ambulatory Visit: Payer: Self-pay | Admitting: Nurse Practitioner

## 2023-12-10 NOTE — Telephone Encounter (Signed)
 Requested Prescriptions  Pending Prescriptions Disp Refills   NIFEdipine  (PROCARDIA -XL/NIFEDICAL-XL) 30 MG 24 hr tablet [Pharmacy Med Name: NIFEDIPINE  ER 30 MG TABLET] 90 tablet 0    Sig: TAKE 1 TABLET BY MOUTH EVERY DAY     Cardiovascular: Calcium Channel Blockers 2 Failed - 12/10/2023  4:00 PM      Failed - Last BP in normal range    BP Readings from Last 1 Encounters:  12/01/23 (!) 140/80         Failed - Valid encounter within last 6 months    Recent Outpatient Visits           1 week ago Subacute cough   [place] [facility] Herold Hadassah SQUIBB, MD   7 months ago Depression, major, single episode, severe [facility])   [facility] Melvin Pao, NP              Passed - Last Heart Rate in normal range    Pulse Readings from Last 1 Encounters:  12/01/23 81          sertraline  (ZOLOFT ) 100 MG tablet [Pharmacy Med Name: SERTRALINE  HCL 100 MG TABLET] 90 tablet 0    Sig: TAKE 1 TABLET BY MOUTH EVERY DAY     Psychiatry:  Antidepressants - SSRI - sertraline  Failed - 12/10/2023  4:00 PM      Failed - ALT in normal range and within 360 days    ALT  Date Value Ref Range Status  01/27/2023 38 (H) 0 - 32 IU/L Final         Failed - Valid encounter within last 6 months    Recent Outpatient Visits           1 week ago Subacute cough   [facility] [facility] Herold Hadassah SQUIBB, MD   7 months ago Depression, major, single episode, severe [facility])    [facility] Melvin Pao, NP              Passed - AST in normal range and within 360 days    AST  Date Value Ref Range Status  01/27/2023 32 0 - 40 IU/L Final         Passed - Completed PHQ-2 or PHQ-9 in the last 360 days

## 2023-12-24 ENCOUNTER — Ambulatory Visit: Admitting: Nurse Practitioner

## 2024-01-04 ENCOUNTER — Ambulatory Visit: Admitting: Nurse Practitioner

## 2024-01-04 NOTE — Progress Notes (Deleted)
° °  There were no vitals taken for this visit.   Subjective:    Patient ID: Claire Walker, female    DOB: 21-May-1995, 28 y.o.   MRN: [id number]  HPI: Claire Walker is a 28 y.o. female  No chief complaint on file.  She has been experiencing a persistent cough since September, characterized by intermittent episodes. The cough is sometimes dry, but she also produces green mucus. These episodes occur every two weeks, lasting for about two weeks each time, and then resolve before recurring. This pattern has been consistent, making it difficult for her to manage alongside her work responsibilities.  She experiences significant sinus pressure and facial soreness, particularly in the upper areas. She suspects possible mold exposure in her older home, where she has lived since May, correlating the onset of her symptoms with her move there.  She has been using Afrin nasal spray heavily, which helps her breathe better and clear some mucus, although it causes irritation. She also uses Mucinex DM, alternates Tylenol  and ibuprofen , and takes cough medicine at night to aid sleep. She has an albuterol  inhaler but avoids using it due to her high blood pressure, which is managed with medication that causes severe migraines.  Her medical history includes high blood pressure managed with medication that causes migraines. The patient reports that she has not been to the doctor for these symptoms and has been trying to manage them on her own.   Relevant past medical, surgical, family and social history reviewed and updated as indicated. Interim medical history since our last visit reviewed. Allergies and medications reviewed and updated.  Review of Systems  Per HPI unless specifically indicated above     Objective:    There were no vitals taken for this visit.  Wt Readings from Last 3 Encounters:  12/01/23 (!) 389 lb (176.4 kg)  06/28/23 (!) 390 lb (176.9 kg)  04/27/23 (!) 378 lb 12.8 oz (171.8 kg)     Physical Exam  Results for orders placed or performed during the hospital encounter of 06/28/23  Urinalysis, Routine w reflex microscopic -Urine, Clean Catch   Collection Time: 06/28/23 10:08 AM  Result Value Ref Range   Color, Urine YELLOW (A) YELLOW   APPearance HAZY (A) CLEAR   Specific Gravity, Urine 1.024 1.005 - 1.030   pH 5.0 5.0 - 8.0   Glucose, UA NEGATIVE NEGATIVE mg/dL   Hgb urine dipstick LARGE (A) NEGATIVE   Bilirubin Urine NEGATIVE NEGATIVE   Ketones, ur NEGATIVE NEGATIVE mg/dL   Protein, ur NEGATIVE NEGATIVE mg/dL   Nitrite NEGATIVE NEGATIVE   Leukocytes,Ua SMALL (A) NEGATIVE   RBC / HPF 0-5 0 - 5 RBC/hpf   WBC, UA 0-5 0 - 5 WBC/hpf   Bacteria, UA NONE SEEN NONE SEEN   Squamous Epithelial / HPF 0-5 0 - 5 /HPF   Mucus PRESENT   POC Urine Pregnancy, ED   Collection Time: 06/28/23 10:09 AM  Result Value Ref Range   Preg Test, Ur Negative Negative      Assessment & Plan:   Problem List Items Addressed This Visit   None    Follow up plan: No follow-ups on file.

## 2024-02-25 ENCOUNTER — Other Ambulatory Visit: Payer: Self-pay | Admitting: Nurse Practitioner
